# Patient Record
Sex: Female | Born: 1935 | Race: White | Hispanic: No | Smoking: Never smoker
Health system: Southern US, Community
[De-identification: ages and names within clinical notes are randomized; demographics above are authoritative.]

## PROBLEM LIST (undated history)

## (undated) DIAGNOSIS — M1990 Unspecified osteoarthritis, unspecified site: Secondary | ICD-10-CM

## (undated) DIAGNOSIS — I10 Essential (primary) hypertension: Secondary | ICD-10-CM

## (undated) DIAGNOSIS — F32A Depression, unspecified: Secondary | ICD-10-CM

## (undated) DIAGNOSIS — IMO0001 Reserved for inherently not codable concepts without codable children: Secondary | ICD-10-CM

## (undated) DIAGNOSIS — H409 Unspecified glaucoma: Secondary | ICD-10-CM

## (undated) DIAGNOSIS — Z923 Personal history of irradiation: Secondary | ICD-10-CM

## (undated) DIAGNOSIS — J302 Other seasonal allergic rhinitis: Secondary | ICD-10-CM

## (undated) DIAGNOSIS — G2581 Restless legs syndrome: Secondary | ICD-10-CM

## (undated) DIAGNOSIS — C50912 Malignant neoplasm of unspecified site of left female breast: Secondary | ICD-10-CM

## (undated) DIAGNOSIS — N390 Urinary tract infection, site not specified: Secondary | ICD-10-CM

## (undated) DIAGNOSIS — F329 Major depressive disorder, single episode, unspecified: Secondary | ICD-10-CM

## (undated) DIAGNOSIS — H9190 Unspecified hearing loss, unspecified ear: Secondary | ICD-10-CM

## (undated) DIAGNOSIS — E7800 Pure hypercholesterolemia, unspecified: Secondary | ICD-10-CM

## (undated) HISTORY — DX: Unspecified glaucoma: H40.9

## (undated) HISTORY — DX: Essential (primary) hypertension: I10

## (undated) HISTORY — PX: CATARACT EXTRACTION W/ INTRAOCULAR LENS  IMPLANT, BILATERAL: SHX1307

---

## 1968-11-18 HISTORY — PX: TUBAL LIGATION: SHX77

## 1996-11-18 HISTORY — PX: FOOT SURGERY: SHX648

## 1998-06-22 ENCOUNTER — Other Ambulatory Visit: Admission: RE | Admit: 1998-06-22 | Discharge: 1998-06-22 | Payer: Self-pay | Admitting: Gynecology

## 1998-09-18 ENCOUNTER — Encounter: Payer: Self-pay | Admitting: Gynecology

## 1998-09-18 ENCOUNTER — Ambulatory Visit (HOSPITAL_COMMUNITY): Admission: RE | Admit: 1998-09-18 | Discharge: 1998-09-18 | Payer: Self-pay | Admitting: Gynecology

## 1999-07-02 ENCOUNTER — Other Ambulatory Visit: Admission: RE | Admit: 1999-07-02 | Discharge: 1999-07-02 | Payer: Self-pay | Admitting: Gynecology

## 1999-09-25 ENCOUNTER — Ambulatory Visit (HOSPITAL_COMMUNITY): Admission: RE | Admit: 1999-09-25 | Discharge: 1999-09-25 | Payer: Self-pay | Admitting: Gynecology

## 1999-09-25 ENCOUNTER — Encounter: Payer: Self-pay | Admitting: Gynecology

## 2000-01-18 ENCOUNTER — Encounter (INDEPENDENT_AMBULATORY_CARE_PROVIDER_SITE_OTHER): Payer: Self-pay | Admitting: Specialist

## 2000-01-18 ENCOUNTER — Ambulatory Visit (HOSPITAL_COMMUNITY): Admission: RE | Admit: 2000-01-18 | Discharge: 2000-01-18 | Payer: Self-pay | Admitting: Gynecology

## 2000-07-14 ENCOUNTER — Other Ambulatory Visit: Admission: RE | Admit: 2000-07-14 | Discharge: 2000-07-14 | Payer: Self-pay | Admitting: Gynecology

## 2000-09-26 ENCOUNTER — Ambulatory Visit (HOSPITAL_COMMUNITY): Admission: RE | Admit: 2000-09-26 | Discharge: 2000-09-26 | Payer: Self-pay | Admitting: Gynecology

## 2000-09-26 ENCOUNTER — Encounter: Payer: Self-pay | Admitting: Gynecology

## 2000-11-18 HISTORY — PX: KNEE ARTHROSCOPY: SUR90

## 2001-07-15 ENCOUNTER — Other Ambulatory Visit: Admission: RE | Admit: 2001-07-15 | Discharge: 2001-07-15 | Payer: Self-pay | Admitting: Gynecology

## 2001-10-07 ENCOUNTER — Ambulatory Visit (HOSPITAL_COMMUNITY): Admission: RE | Admit: 2001-10-07 | Discharge: 2001-10-07 | Payer: Self-pay | Admitting: Gynecology

## 2001-10-07 ENCOUNTER — Encounter: Payer: Self-pay | Admitting: Gynecology

## 2001-10-13 ENCOUNTER — Encounter: Payer: Self-pay | Admitting: Gynecology

## 2001-10-13 ENCOUNTER — Encounter: Admission: RE | Admit: 2001-10-13 | Discharge: 2001-10-13 | Payer: Self-pay | Admitting: Gynecology

## 2002-07-26 ENCOUNTER — Other Ambulatory Visit: Admission: RE | Admit: 2002-07-26 | Discharge: 2002-07-26 | Payer: Self-pay | Admitting: Gynecology

## 2002-10-18 ENCOUNTER — Encounter: Payer: Self-pay | Admitting: Gynecology

## 2002-10-18 ENCOUNTER — Encounter: Admission: RE | Admit: 2002-10-18 | Discharge: 2002-10-18 | Payer: Self-pay | Admitting: Gynecology

## 2003-10-19 ENCOUNTER — Encounter: Payer: Self-pay | Admitting: Internal Medicine

## 2003-10-19 LAB — CONVERTED CEMR LAB

## 2003-10-27 ENCOUNTER — Other Ambulatory Visit: Admission: RE | Admit: 2003-10-27 | Discharge: 2003-10-27 | Payer: Self-pay | Admitting: Gynecology

## 2003-12-15 ENCOUNTER — Encounter: Admission: RE | Admit: 2003-12-15 | Discharge: 2003-12-15 | Payer: Self-pay | Admitting: Gynecology

## 2004-09-03 ENCOUNTER — Ambulatory Visit: Payer: Self-pay | Admitting: Orthopaedic Surgery

## 2004-12-07 ENCOUNTER — Ambulatory Visit: Payer: Self-pay | Admitting: Internal Medicine

## 2004-12-20 ENCOUNTER — Encounter: Admission: RE | Admit: 2004-12-20 | Discharge: 2004-12-20 | Payer: Self-pay | Admitting: Gynecology

## 2004-12-25 ENCOUNTER — Ambulatory Visit: Payer: Self-pay | Admitting: Internal Medicine

## 2005-01-21 ENCOUNTER — Ambulatory Visit: Payer: Self-pay | Admitting: Internal Medicine

## 2005-01-22 ENCOUNTER — Ambulatory Visit: Payer: Self-pay | Admitting: Family Medicine

## 2005-02-04 ENCOUNTER — Ambulatory Visit: Payer: Self-pay | Admitting: Internal Medicine

## 2005-02-04 ENCOUNTER — Ambulatory Visit (HOSPITAL_COMMUNITY): Admission: RE | Admit: 2005-02-04 | Discharge: 2005-02-04 | Payer: Self-pay | Admitting: Internal Medicine

## 2005-02-08 ENCOUNTER — Ambulatory Visit (HOSPITAL_COMMUNITY): Admission: RE | Admit: 2005-02-08 | Discharge: 2005-02-08 | Payer: Self-pay | Admitting: Internal Medicine

## 2005-02-09 ENCOUNTER — Emergency Department (HOSPITAL_COMMUNITY): Admission: EM | Admit: 2005-02-09 | Discharge: 2005-02-09 | Payer: Self-pay | Admitting: Emergency Medicine

## 2005-05-14 ENCOUNTER — Ambulatory Visit: Payer: Self-pay | Admitting: Internal Medicine

## 2005-06-19 ENCOUNTER — Ambulatory Visit (HOSPITAL_COMMUNITY): Admission: RE | Admit: 2005-06-19 | Discharge: 2005-06-19 | Payer: Self-pay | Admitting: General Surgery

## 2005-08-21 ENCOUNTER — Encounter (INDEPENDENT_AMBULATORY_CARE_PROVIDER_SITE_OTHER): Payer: Self-pay | Admitting: *Deleted

## 2005-08-21 ENCOUNTER — Ambulatory Visit (HOSPITAL_COMMUNITY): Admission: RE | Admit: 2005-08-21 | Discharge: 2005-08-22 | Payer: Self-pay | Admitting: General Surgery

## 2005-08-21 HISTORY — PX: DIAGNOSTIC LAPAROSCOPY: SUR761

## 2005-09-10 ENCOUNTER — Encounter: Admission: RE | Admit: 2005-09-10 | Discharge: 2005-09-10 | Payer: Self-pay | Admitting: General Surgery

## 2006-01-02 ENCOUNTER — Encounter: Admission: RE | Admit: 2006-01-02 | Discharge: 2006-01-02 | Payer: Self-pay | Admitting: Gynecology

## 2006-01-29 ENCOUNTER — Other Ambulatory Visit: Admission: RE | Admit: 2006-01-29 | Discharge: 2006-01-29 | Payer: Self-pay | Admitting: Gynecology

## 2006-05-07 ENCOUNTER — Encounter: Admission: RE | Admit: 2006-05-07 | Discharge: 2006-05-07 | Payer: Self-pay | Admitting: General Surgery

## 2006-08-18 ENCOUNTER — Ambulatory Visit: Payer: Self-pay | Admitting: Internal Medicine

## 2006-09-09 ENCOUNTER — Ambulatory Visit: Payer: Self-pay | Admitting: Internal Medicine

## 2006-09-09 LAB — CONVERTED CEMR LAB
ALT: 30 units/L (ref 0–40)
Albumin: 3.7 g/dL (ref 3.5–5.2)
Alkaline Phosphatase: 79 units/L (ref 39–117)
BUN: 12 mg/dL (ref 6–23)
CO2: 29 meq/L (ref 19–32)
GFR calc non Af Amer: 88 mL/min
Glucose, Bld: 88 mg/dL (ref 70–99)
Total Bilirubin: 0.6 mg/dL (ref 0.3–1.2)
Total Protein: 7 g/dL (ref 6.0–8.3)

## 2006-09-12 ENCOUNTER — Ambulatory Visit: Payer: Self-pay | Admitting: Internal Medicine

## 2006-09-23 ENCOUNTER — Ambulatory Visit: Payer: Self-pay | Admitting: Internal Medicine

## 2007-01-05 ENCOUNTER — Ambulatory Visit: Payer: Self-pay | Admitting: Internal Medicine

## 2007-01-05 LAB — CONVERTED CEMR LAB
ALT: 36 units/L (ref 0–40)
Alkaline Phosphatase: 112 units/L (ref 39–117)
BUN: 21 mg/dL (ref 6–23)
CO2: 30 meq/L (ref 19–32)
Eosinophils Absolute: 0.5 10*3/uL (ref 0.0–0.6)
GFR calc Af Amer: 38 mL/min
Leukocytes, UA: NEGATIVE
Lymphocytes Relative: 3 % — ABNORMAL LOW (ref 12.0–46.0)
MCV: 95.3 fL (ref 78.0–100.0)
Monocytes Relative: 1.5 % — ABNORMAL LOW (ref 3.0–11.0)
Neutro Abs: 9.4 10*3/uL — ABNORMAL HIGH (ref 1.4–7.7)
Platelets: 145 10*3/uL — ABNORMAL LOW (ref 150–400)
Potassium: 3.3 meq/L — ABNORMAL LOW (ref 3.5–5.1)
Specific Gravity, Urine: >=1.03 (ref 1.000–1.03)
Urine Glucose: NEGATIVE mg/dL
Urobilinogen, UA: 1 (ref 0.0–1.0)
WBC, UA: NONE SEEN cells/hpf

## 2007-01-06 ENCOUNTER — Ambulatory Visit: Payer: Self-pay | Admitting: Internal Medicine

## 2007-01-06 ENCOUNTER — Inpatient Hospital Stay (HOSPITAL_COMMUNITY): Admission: AD | Admit: 2007-01-06 | Discharge: 2007-01-10 | Payer: Self-pay | Admitting: Internal Medicine

## 2007-01-07 ENCOUNTER — Ambulatory Visit: Payer: Self-pay | Admitting: Internal Medicine

## 2007-01-13 ENCOUNTER — Ambulatory Visit: Payer: Self-pay | Admitting: Internal Medicine

## 2007-01-19 ENCOUNTER — Ambulatory Visit: Payer: Self-pay | Admitting: Internal Medicine

## 2007-02-18 ENCOUNTER — Encounter: Admission: RE | Admit: 2007-02-18 | Discharge: 2007-02-18 | Payer: Self-pay | Admitting: Gynecology

## 2007-02-24 ENCOUNTER — Encounter: Admission: RE | Admit: 2007-02-24 | Discharge: 2007-02-24 | Payer: Self-pay | Admitting: Gynecology

## 2007-02-27 ENCOUNTER — Encounter: Admission: RE | Admit: 2007-02-27 | Discharge: 2007-02-27 | Payer: Self-pay | Admitting: Gynecology

## 2007-04-08 ENCOUNTER — Encounter: Admission: RE | Admit: 2007-04-08 | Discharge: 2007-04-08 | Payer: Self-pay | Admitting: Surgery

## 2007-04-10 ENCOUNTER — Encounter (INDEPENDENT_AMBULATORY_CARE_PROVIDER_SITE_OTHER): Payer: Self-pay | Admitting: Surgery

## 2007-04-10 ENCOUNTER — Ambulatory Visit (HOSPITAL_BASED_OUTPATIENT_CLINIC_OR_DEPARTMENT_OTHER): Admission: RE | Admit: 2007-04-10 | Discharge: 2007-04-10 | Payer: Self-pay | Admitting: Surgery

## 2007-04-10 ENCOUNTER — Encounter: Admission: RE | Admit: 2007-04-10 | Discharge: 2007-04-10 | Payer: Self-pay | Admitting: Surgery

## 2007-04-10 HISTORY — PX: BREAST SURGERY: SHX581

## 2007-06-13 ENCOUNTER — Encounter: Payer: Self-pay | Admitting: Internal Medicine

## 2007-06-13 DIAGNOSIS — M899 Disorder of bone, unspecified: Secondary | ICD-10-CM | POA: Insufficient documentation

## 2007-06-13 DIAGNOSIS — F3289 Other specified depressive episodes: Secondary | ICD-10-CM | POA: Insufficient documentation

## 2007-06-13 DIAGNOSIS — K219 Gastro-esophageal reflux disease without esophagitis: Secondary | ICD-10-CM

## 2007-06-13 DIAGNOSIS — F329 Major depressive disorder, single episode, unspecified: Secondary | ICD-10-CM

## 2007-06-13 DIAGNOSIS — M949 Disorder of cartilage, unspecified: Secondary | ICD-10-CM

## 2007-06-13 DIAGNOSIS — Z86718 Personal history of other venous thrombosis and embolism: Secondary | ICD-10-CM

## 2007-09-21 ENCOUNTER — Encounter: Payer: Self-pay | Admitting: Internal Medicine

## 2007-10-24 ENCOUNTER — Encounter (INDEPENDENT_AMBULATORY_CARE_PROVIDER_SITE_OTHER): Payer: Self-pay | Admitting: *Deleted

## 2007-10-30 ENCOUNTER — Ambulatory Visit: Payer: Self-pay | Admitting: Internal Medicine

## 2007-10-30 DIAGNOSIS — I10 Essential (primary) hypertension: Secondary | ICD-10-CM

## 2007-10-30 DIAGNOSIS — H0019 Chalazion unspecified eye, unspecified eyelid: Secondary | ICD-10-CM

## 2007-10-30 DIAGNOSIS — R209 Unspecified disturbances of skin sensation: Secondary | ICD-10-CM | POA: Insufficient documentation

## 2007-10-30 HISTORY — DX: Essential (primary) hypertension: I10

## 2007-11-09 LAB — CONVERTED CEMR LAB
BUN: 15 mg/dL (ref 6–23)
Calcium: 9.4 mg/dL (ref 8.4–10.5)
Chloride: 102 meq/L (ref 96–112)
GFR calc non Af Amer: 88 mL/min
Glucose, Bld: 93 mg/dL (ref 70–99)

## 2007-11-27 ENCOUNTER — Encounter: Admission: RE | Admit: 2007-11-27 | Discharge: 2007-11-27 | Payer: Self-pay | Admitting: General Surgery

## 2008-02-09 ENCOUNTER — Ambulatory Visit: Payer: Self-pay | Admitting: Internal Medicine

## 2008-02-23 ENCOUNTER — Encounter: Admission: RE | Admit: 2008-02-23 | Discharge: 2008-02-23 | Payer: Self-pay | Admitting: Gynecology

## 2008-03-04 ENCOUNTER — Encounter: Payer: Self-pay | Admitting: Internal Medicine

## 2008-05-25 ENCOUNTER — Telehealth: Payer: Self-pay | Admitting: Internal Medicine

## 2008-06-08 ENCOUNTER — Ambulatory Visit: Payer: Self-pay | Admitting: Internal Medicine

## 2008-06-08 LAB — CONVERTED CEMR LAB
ALT: 34 units/L (ref 0–35)
AST: 32 units/L (ref 0–37)
Basophils Absolute: 0 10*3/uL (ref 0.0–0.1)
Eosinophils Absolute: 0.2 10*3/uL (ref 0.0–0.7)
HDL: 37.5 mg/dL — ABNORMAL LOW (ref >39.0)
MCHC: 33.8 g/dL (ref 30.0–36.0)
MCV: 95 fL (ref 78.0–100.0)
Monocytes Absolute: 0.4 10*3/uL (ref 0.1–1.0)
Neutrophils Relative %: 59.5 % (ref 43.0–77.0)
Platelets: 236 10*3/uL (ref 150–400)
RDW: 13.4 % (ref 11.5–14.6)
TSH: 3.09 microintl units/mL (ref 0.35–5.50)
Triglycerides: 66 mg/dL (ref 0–149)
VLDL: 13 mg/dL (ref 0–40)
WBC: 6.4 10*3/uL (ref 4.5–10.5)

## 2008-06-22 ENCOUNTER — Ambulatory Visit: Payer: Self-pay | Admitting: Internal Medicine

## 2008-06-22 DIAGNOSIS — K5730 Diverticulosis of large intestine without perforation or abscess without bleeding: Secondary | ICD-10-CM | POA: Insufficient documentation

## 2008-06-22 DIAGNOSIS — E785 Hyperlipidemia, unspecified: Secondary | ICD-10-CM | POA: Insufficient documentation

## 2008-06-27 ENCOUNTER — Encounter: Payer: Self-pay | Admitting: Internal Medicine

## 2008-07-29 ENCOUNTER — Encounter: Payer: Self-pay | Admitting: Internal Medicine

## 2008-11-23 ENCOUNTER — Encounter: Payer: Self-pay | Admitting: Internal Medicine

## 2008-12-12 ENCOUNTER — Telehealth: Payer: Self-pay | Admitting: Internal Medicine

## 2008-12-13 ENCOUNTER — Telehealth: Payer: Self-pay | Admitting: Internal Medicine

## 2008-12-14 ENCOUNTER — Encounter: Payer: Self-pay | Admitting: Internal Medicine

## 2008-12-27 ENCOUNTER — Encounter: Payer: Self-pay | Admitting: Internal Medicine

## 2008-12-28 ENCOUNTER — Ambulatory Visit: Payer: Self-pay | Admitting: Internal Medicine

## 2008-12-28 DIAGNOSIS — M5430 Sciatica, unspecified side: Secondary | ICD-10-CM

## 2009-01-05 ENCOUNTER — Encounter: Payer: Self-pay | Admitting: Internal Medicine

## 2009-01-16 ENCOUNTER — Encounter: Payer: Self-pay | Admitting: Internal Medicine

## 2009-01-25 ENCOUNTER — Encounter: Payer: Self-pay | Admitting: Internal Medicine

## 2009-03-01 ENCOUNTER — Encounter: Admission: RE | Admit: 2009-03-01 | Discharge: 2009-03-01 | Payer: Self-pay | Admitting: Gynecology

## 2009-06-21 ENCOUNTER — Ambulatory Visit: Payer: Self-pay | Admitting: Internal Medicine

## 2009-06-21 DIAGNOSIS — R5381 Other malaise: Secondary | ICD-10-CM | POA: Insufficient documentation

## 2009-06-21 DIAGNOSIS — M5137 Other intervertebral disc degeneration, lumbosacral region: Secondary | ICD-10-CM | POA: Insufficient documentation

## 2009-06-21 DIAGNOSIS — R5383 Other fatigue: Secondary | ICD-10-CM

## 2009-06-21 LAB — CONVERTED CEMR LAB
ALT: 17 units/L (ref 0–35)
Alkaline Phosphatase: 67 units/L (ref 39–117)
BUN: 11 mg/dL (ref 6–23)
Basophils Relative: 1 % (ref 0.0–3.0)
Bilirubin, Direct: 0.1 mg/dL (ref 0.0–0.3)
Calcium: 8.9 mg/dL (ref 8.4–10.5)
Cholesterol: 170 mg/dL (ref 0–200)
Creatinine, Ser: 0.7 mg/dL (ref 0.4–1.2)
Eosinophils Absolute: 0.2 10*3/uL (ref 0.0–0.7)
Eosinophils Relative: 3 % (ref 0.0–5.0)
LDL Cholesterol: 113 mg/dL — ABNORMAL HIGH (ref 0–99)
Lymphocytes Relative: 26.7 % (ref 12.0–46.0)
Neutrophils Relative %: 62.8 % (ref 43.0–77.0)
Platelets: 218 10*3/uL (ref 150.0–400.0)
RBC: 4.1 M/uL (ref 3.87–5.11)
Total Bilirubin: 0.8 mg/dL (ref 0.3–1.2)
Total CHOL/HDL Ratio: 4
Total Protein: 7.5 g/dL (ref 6.0–8.3)
Triglycerides: 67 mg/dL (ref 0.0–149.0)
VLDL: 13.4 mg/dL (ref 0.0–40.0)
WBC: 6.6 10*3/uL (ref 4.5–10.5)

## 2009-09-29 ENCOUNTER — Telehealth: Payer: Self-pay | Admitting: Internal Medicine

## 2009-11-18 HISTORY — PX: CERVICAL FUSION: SHX112

## 2010-01-04 ENCOUNTER — Ambulatory Visit: Payer: Self-pay | Admitting: Internal Medicine

## 2010-01-04 DIAGNOSIS — R079 Chest pain, unspecified: Secondary | ICD-10-CM

## 2010-03-27 ENCOUNTER — Encounter: Admission: RE | Admit: 2010-03-27 | Discharge: 2010-03-27 | Payer: Self-pay | Admitting: Gynecology

## 2010-07-05 ENCOUNTER — Ambulatory Visit: Payer: Self-pay | Admitting: Internal Medicine

## 2010-08-13 ENCOUNTER — Telehealth: Payer: Self-pay | Admitting: Internal Medicine

## 2010-10-09 ENCOUNTER — Encounter: Payer: Self-pay | Admitting: Internal Medicine

## 2010-12-09 ENCOUNTER — Encounter: Payer: Self-pay | Admitting: Gynecology

## 2010-12-12 ENCOUNTER — Ambulatory Visit: Payer: Self-pay | Admitting: Ophthalmology

## 2010-12-18 NOTE — Letter (Signed)
Summary: Nerve Root Block / [facility]  Nerve Root Block / [facility]   Imported By: Lennie Odor 10/12/2010 12:00:55  _____________________________________________________________________  External Attachment:    Type:   Image     Comment:   External Document

## 2010-12-18 NOTE — Progress Notes (Signed)
°  Phone Note Refill Request Message from:  Fax from Pharmacy on December 12, 2008 4:08 PM  Refills Requested: Medication #1:  GABAPENTIN 300 MG  TABS 2 tablets by mouth at bedtime  Method Requested: Fax to Mail Away Pharmacy Initial call taken by: Payton Spark CMA,  December 12, 2008 4:08 PM      Appended Document:  LM w/ female at home # for Pt to CB.  Pt needs OV for further Rx refills.

## 2010-12-18 NOTE — Assessment & Plan Note (Signed)
Summary: 6 mos f/u #/cd   Vital Signs:  Patient profile:   75 year old female Height:      65 inches Weight:      183.50 pounds BMI:     30.65 O2 Sat:      95 % on Room air Temp:     98.1 degrees F oral Pulse rate:   68 / minute BP sitting:   110 / 70  (left arm) Cuff size:   regular  Vitals Entered By: Zella Ball Ewing CMA Duncan Dull) (July 05, 2010 10:27 AM)  O2 Flow:  Room air CC: 6 month ROV/RE   CC:  6 month ROV/RE.  History of Present Illness: here to f/u;  trying to follow lwoer hcol diet; has chronic recurrent pain from herniated disc l2 s/p ESI x 2 (Dr Ethelene Hal) - pain overall getting beter but still hurts to walk, without incr LE pain/weak or numbness.  Pt denies CP, sob, doe, wheezing, orthopnea, pnd, worsening LE edema, palps, dizziness or syncope  Pt denies new neuro symptoms such as headache, facial or extremity weakness   Husband on dialysis, pt has ongoing depression , no worsening suicidal ideation or panic.  Spends most of her time caring for him , alhtough he can drive and do some help around the house.    Preventive Screening-Counseling & Management      Drug Use:  no.    Problems Prior to Update: 1)  Preventive Health Care  (ICD-V70.0) 2)  Chest Pain  (ICD-786.50) 3)  Disc Disease, Lumbar  (ICD-722.52) 4)  Fatigue  (ICD-780.79) 5)  Sciatica, Left  (ICD-724.3) 6)  Diverticulosis, Colon  (ICD-562.10) 7)  Hyperlipidemia  (ICD-272.4) 8)  Chalazion  (ICD-373.2) 9)  Paresthesia, Hands  (ICD-782.0) 10)  Hypertension  (ICD-401.9) 11)  Osteopenia  (ICD-733.90) 12)  Dvt, Hx of  (ICD-V12.51) 13)  Gerd  (ICD-530.81) 14)  Depression  (ICD-311)  Medications Prior to Update: 1)  Gabapentin 300 Mg Caps (Gabapentin) .... 2 By Mouth Three Times A Day 2)  Hydrochlorothiazide 25 Mg  Tabs (Hydrochlorothiazide) .... Take 1/2 Tab By Mouth Every Morning 3)  Fexofenadine Hcl 180 Mg  Tabs (Fexofenadine Hcl) .Marland Kitchen.. 1 By Mouth Once Daily 4)  Proctosol Hc 2.5 %  Crea (Hydrocortisone)  .... Apply To Affected Area Three Times A Day As Needed 5)  Hemorrhoidal-Hc 25 Mg  Supp (Hydrocortisone Acetate) .... Insert One Suppository Rectally Two Times A Day As Needed 6)  Acidophilus   Tabs (Lactobacillus) .... Take 1 Tablet By Mouth Once A Day 7)  Fish Oil 1000 Mg  Caps (Omega-3 Fatty Acids) .... Take 1 Tablet By Mouth Once A Day 8)  Sertraline Hcl 100 Mg  Tabs (Sertraline Hcl) .... 2 By Mouth Once Daily 9)  Clobetasol Propionate E 0.05 %  Crea (Clobetasol Prop Emollient Base) 10)  Fluconazole 100 Mg  Tabs (Fluconazole) 11)  Anucort-Hc 25 Mg  Supp (Hydrocortisone Acetate) .Marland Kitchen.. 1 Pr Two Times A Day As Needed 12)  Estrace 0.1 Mg/gm  Crea (Estradiol) 13)  Xalatan 0.005 %  Soln (Latanoprost) .Marland Kitchen.. 1 Gtt Each Eye Qhs 14)  Nitrofurantoin Macrocrystal 100 Mg Caps (Nitrofurantoin Macrocrystal) .Marland Kitchen.. 1 By Mouth At Bedtime 15)  Adult Aspirin Ec Low Strength 81 Mg Tbec (Aspirin) .Marland Kitchen.. 1 By Mouth Once Daily 16)  Alprazolam 0.25 Mg Tabs (Alprazolam) .Marland Kitchen.. 1po Two Times A Day As Needed  Current Medications (verified): 1)  Gabapentin 300 Mg Caps (Gabapentin) .... 2 By Mouth Three Times A Day 2)  Hydrochlorothiazide 25 Mg  Tabs (Hydrochlorothiazide) .... Take 1/2 Tab By Mouth Every Morning 3)  Fexofenadine Hcl 180 Mg  Tabs (Fexofenadine Hcl) .Marland Kitchen.. 1 By Mouth Once Daily As Needed Allergies 4)  Acidophilus   Tabs (Lactobacillus) .... Take 1 Tablet By Mouth Once A Day 5)  Fish Oil 1000 Mg  Caps (Omega-3 Fatty Acids) .... Take 1 Tablet By Mouth Once A Day 6)  Sertraline Hcl 100 Mg  Tabs (Sertraline Hcl) .... 2 By Mouth Once Daily 7)  Clobetasol Propionate E 0.05 %  Crea (Clobetasol Prop Emollient Base) 8)  Fluconazole 100 Mg  Tabs (Fluconazole) .Marland Kitchen.. 1 By Mouth Once Daily For 3 Days As Needed 9)  Anucort-Hc 25 Mg  Supp (Hydrocortisone Acetate) .Marland Kitchen.. 1 Pr Two Times A Day As Needed 10)  Estrace 0.1 Mg/gm  Crea (Estradiol) 11)  Xalatan 0.005 %  Soln (Latanoprost) .Marland Kitchen.. 1 Gtt Each Eye Qhs 12)  Nitrofurantoin  Macrocrystal 100 Mg Caps (Nitrofurantoin Macrocrystal) .Marland Kitchen.. 1 By Mouth At Bedtime 13)  Adult Aspirin Ec Low Strength 81 Mg Tbec (Aspirin) .Marland Kitchen.. 1 By Mouth Once Daily 14)  Alprazolam 0.25 Mg Tabs (Alprazolam) .Marland Kitchen.. 1po Two Times A Day As Needed  Allergies (verified): 1)  Codeine 2)  * Avelox 3)  Penicillin 4)  * Mirapex 5)  * Requip  Past History:  Past Medical History: Last updated: 06/22/2008 Depression GERD Glaucoma Right Hepatic Cyst Restless Leg Syndrome DVT, hx of Osteopenia Left breast lump status post biopsy April of 2008 ( negative for malignancy) Hyperlipidemia Diverticulosis, colon hx of pna - hosp'd 2/08  Past Surgical History: Last updated: 10/30/2007 Tubal ligation Knee, foot surgery Lumpectomy April 2008.  Social History: Last updated: 07/05/2010 Retired - Data processing manager Married Never Smoked Alcohol use-no she has two children Drug use-no  Risk Factors: Smoking Status: never (10/30/2007)  Social History: Reviewed history from 06/22/2008 and no changes required. Retired Theatre manager Married Never Smoked Alcohol use-no she has two children Drug use-no Drug Use:  no  Review of Systems       all otherwise negative per pt -    Physical Exam  General:  alert and overweight-appearing.   Head:  normocephalic and atraumatic.   Eyes:  vision grossly intact, pupils equal, and pupils round.   Ears:  R ear normal and L ear normal.   Nose:  no external deformity and no nasal discharge.   Mouth:  no gingival abnormalities and pharynx pink and moist.   Neck:  supple and no masses.   Lungs:  normal respiratory effort and normal breath sounds.   Heart:  normal rate and regular rhythm.   Abdomen:  soft, non-tender, and normal bowel sounds.   Msk:  no spine tender or bilat paravertebral tender Extremities:  no edema, no erythema  Neurologic:  cranial nerves II-XII intact and strength normal in all extremities.  except for prox and distal LLE  3+/5  Psych:  depressed affect and slightly anxious.     Impression & Recommendations:  Problem # 1:  DISC DISEASE, LUMBAR (ICD-722.52) with worsening LLE weakness, for ortho f/u - likely needs surgury but she has been deferring due to husband's illness.Continue all previous medications as before this visit   Problem # 2:  DEPRESSION (ICD-311)  Her updated medication list for this problem includes:    Sertraline Hcl 100 Mg Tabs (Sertraline hcl) .Marland Kitchen... 2 by mouth once daily    Alprazolam 0.25 Mg Tabs (Alprazolam) .Marland Kitchen... 1po two times a day as  needed stable overall by hx and exam, ok to continue meds/tx as is , declines counseling needed at this time  Problem # 3:  HYPERTENSION (ICD-401.9)  Her updated medication list for this problem includes:    Hydrochlorothiazide 25 Mg Tabs (Hydrochlorothiazide) .Marland Kitchen... Take 1/2 tab by mouth every morning  BP today: 110/70 Prior BP: 132/80 (01/04/2010)  Labs Reviewed: K+: 3.7 (06/21/2009) Creat: : 0.7 (06/21/2009)   Chol: 170 (06/21/2009)   HDL: 43.20 (06/21/2009)   LDL: 113 (06/21/2009)   TG: 67.0 (06/21/2009) stable overall by hx and exam, ok to continue meds/tx as is   Complete Medication List: 1)  Gabapentin 300 Mg Caps (Gabapentin) .... 2 by mouth three times a day 2)  Hydrochlorothiazide 25 Mg Tabs (Hydrochlorothiazide) .... Take 1/2 tab by mouth every morning 3)  Fexofenadine Hcl 180 Mg Tabs (Fexofenadine hcl) .Marland Kitchen.. 1 by mouth once daily as needed allergies 4)  Acidophilus Tabs (Lactobacillus) .... Take 1 tablet by mouth once a day 5)  Fish Oil 1000 Mg Caps (Omega-3 fatty acids) .... Take 1 tablet by mouth once a day 6)  Sertraline Hcl 100 Mg Tabs (Sertraline hcl) .... 2 by mouth once daily 7)  Clobetasol Propionate E 0.05 % Crea (Clobetasol prop emollient base) 8)  Fluconazole 100 Mg Tabs (Fluconazole) .Marland Kitchen.. 1 by mouth once daily for 3 days as needed 9)  Anucort-hc 25 Mg Supp (Hydrocortisone acetate) .Marland Kitchen.. 1 pr two times a day as needed 10)   Estrace 0.1 Mg/gm Crea (Estradiol) 11)  Xalatan 0.005 % Soln (Latanoprost) .Marland Kitchen.. 1 gtt each eye qhs 12)  Nitrofurantoin Macrocrystal 100 Mg Caps (Nitrofurantoin macrocrystal) .Marland Kitchen.. 1 by mouth at bedtime 13)  Adult Aspirin Ec Low Strength 81 Mg Tbec (Aspirin) .Marland Kitchen.. 1 by mouth once daily 14)  Alprazolam 0.25 Mg Tabs (Alprazolam) .Marland Kitchen.. 1po two times a day as needed  Patient Instructions: 1)  Continue all previous medications as before this visit  2)  Please schedule a follow-up appointment in 6 months for your "yearly medicare exam" Prescriptions: ALPRAZOLAM 0.25 MG TABS (ALPRAZOLAM) 1po two times a day as needed  #60 x 2   Entered and Authorized by:   Corwin Levins MD   Signed by:   Corwin Levins MD on 07/05/2010   Method used:   Print then Give to Patient   RxID:   [id number] FLUCONAZOLE 100 MG  TABS (FLUCONAZOLE) 1 by mouth once daily for 3 days as needed  #3 x 1   Entered and Authorized by:   Corwin Levins MD   Signed by:   Corwin Levins MD on 07/05/2010   Method used:   Print then Give to Patient   RxID:   [id number] SERTRALINE HCL 100 MG  TABS (SERTRALINE HCL) 2 by mouth once daily  #180 x 3   Entered and Authorized by:   Corwin Levins MD   Signed by:   Corwin Levins MD on 07/05/2010   Method used:   Print then Give to Patient   RxID:   [id number] FEXOFENADINE HCL 180 MG  TABS (FEXOFENADINE HCL) 1 by mouth once daily as needed allergies  #90 x 3   Entered and Authorized by:   Corwin Levins MD   Signed by:   Corwin Levins MD on 07/05/2010   Method used:   Print then Give to Patient   RxID:   [id number] HYDROCHLOROTHIAZIDE 25 MG  TABS (HYDROCHLOROTHIAZIDE) Take 1/2 tab by mouth every morning  #100  x 3   Entered and Authorized by:   Corwin Levins MD   Signed by:   Corwin Levins MD on 07/05/2010   Method used:   Print then Give to Patient   RxID:   [id number] GABAPENTIN 300 MG CAPS (GABAPENTIN) 2 by mouth three times a day  #180 x 5   Entered and Authorized by:    Corwin Levins MD   Signed by:   Corwin Levins MD on 07/05/2010   Method used:   Print then Give to Patient   RxID:   [id number]

## 2010-12-18 NOTE — Assessment & Plan Note (Signed)
Summary: 6 MO ROV /NWS  #   Vital Signs:  Patient profile:   75 year old female Height:      65 inches Weight:      191 pounds BMI:     31.90 O2 Sat:      99 % on Room air Temp:     97.2 degrees F oral Pulse rate:   65 / minute BP sitting:   132 / 80  (left arm) Cuff size:   regular  Vitals Entered ByZella Ball Ewing (January 04, 2010 10:26 AM)  O2 Flow:  Room air  Preventive Care Screening  Last Flu Shot:    Date:  08/18/2009    Results:  Historical   Mammogram:    Date:  02/16/2009    Results:  normal   CC: 6 MO ROV/RE   CC:  6 MO ROV/RE.  History of Present Illness: here with much stress over husband's illness now on dialysis with him "not adjusting well", and she has marked stress and worsening depressive symtpoms without suicidal ideation;  ran out of her sertraline a few days ago adn reqeusts refill, did not sleep last night  - "none at all";denies worsening depressive symptoms or suicidal ideation or panic;  Pt denies , sob, doe, wheezing, orthopnea, pnd, worsening LE edema, palps, dizziness or syncope   Pt denies new neuro symptoms such as headache, facial or extremity weakness   .  does have occasional mild sharp fleeting chest pains diffusely without assoc symptoms , diaphoresis, n/v and not exertional or improved with rest.  No aggrevating factors. Here for wellness Diet: Heart Healthy or DM if diabetic Physical Activities: Sedentary Depression/mood screen: Negative except for above Hearing: Intact bilateral Visual Acuity: Grossly normal ADL's: Capable  Fall Risk: None Home Safety: Good End-of-Life Planning: Advance directive - Full code/I agree   Problems Prior to Update: 1)  Chest Pain  (ICD-786.50) 2)  Disc Disease, Lumbar  (ICD-722.52) 3)  Fatigue  (ICD-780.79) 4)  Sciatica, Left  (ICD-724.3) 5)  Diverticulosis, Colon  (ICD-562.10) 6)  Hyperlipidemia  (ICD-272.4) 7)  Chalazion  (ICD-373.2) 8)  Paresthesia, Hands  (ICD-782.0) 9)  Hypertension   (ICD-401.9) 10)  Osteopenia  (ICD-733.90) 11)  Dvt, Hx of  (ICD-V12.51) 12)  Gerd  (ICD-530.81) 13)  Depression  (ICD-311)  Medications Prior to Update: 1)  Gabapentin 300 Mg Caps (Gabapentin) .... 2 By Mouth Three Times A Day 2)  Hydrochlorothiazide 25 Mg  Tabs (Hydrochlorothiazide) .... Take 1/2 Tab By Mouth Every Morning 3)  Fexofenadine Hcl 180 Mg  Tabs (Fexofenadine Hcl) .Marland Kitchen.. 1 By Mouth Once Daily 4)  Proctosol Hc 2.5 %  Crea (Hydrocortisone) .... Apply To Affected Area Three Times A Day As Needed 5)  Hemorrhoidal-Hc 25 Mg  Supp (Hydrocortisone Acetate) .... Insert One Suppository Rectally Two Times A Day As Needed 6)  Acidophilus   Tabs (Lactobacillus) .... Take 1 Tablet By Mouth Once A Day 7)  Fish Oil 1000 Mg  Caps (Omega-3 Fatty Acids) .... Take 1 Tablet By Mouth Once A Day 8)  Sertraline Hcl 100 Mg  Tabs (Sertraline Hcl) .... One By Mouth Once Daily 9)  Clobetasol Propionate E 0.05 %  Crea (Clobetasol Prop Emollient Base) 10)  Fluconazole 100 Mg  Tabs (Fluconazole) 11)  Anucort-Hc 25 Mg  Supp (Hydrocortisone Acetate) .Marland Kitchen.. 1 Pr Two Times A Day Prr 12)  Estrace 0.1 Mg/gm  Crea (Estradiol) 13)  Xalatan 0.005 %  Soln (Latanoprost) .Marland Kitchen.. 1 Gtt Each Eye Qhs  14)  Nitrofurantoin Macrocrystal 100 Mg Caps (Nitrofurantoin Macrocrystal) .Marland Kitchen.. 1 By Mouth At Bedtime 15)  Adult Aspirin Ec Low Strength 81 Mg Tbec (Aspirin) .Marland Kitchen.. 1 By Mouth Once Daily  Current Medications (verified): 1)  Gabapentin 300 Mg Caps (Gabapentin) .... 2 By Mouth Three Times A Day 2)  Hydrochlorothiazide 25 Mg  Tabs (Hydrochlorothiazide) .... Take 1/2 Tab By Mouth Every Morning 3)  Fexofenadine Hcl 180 Mg  Tabs (Fexofenadine Hcl) .Marland Kitchen.. 1 By Mouth Once Daily 4)  Proctosol Hc 2.5 %  Crea (Hydrocortisone) .... Apply To Affected Area Three Times A Day As Needed 5)  Hemorrhoidal-Hc 25 Mg  Supp (Hydrocortisone Acetate) .... Insert One Suppository Rectally Two Times A Day As Needed 6)  Acidophilus   Tabs (Lactobacillus) .... Take  1 Tablet By Mouth Once A Day 7)  Fish Oil 1000 Mg  Caps (Omega-3 Fatty Acids) .... Take 1 Tablet By Mouth Once A Day 8)  Sertraline Hcl 100 Mg  Tabs (Sertraline Hcl) .... 2 By Mouth Once Daily 9)  Clobetasol Propionate E 0.05 %  Crea (Clobetasol Prop Emollient Base) 10)  Fluconazole 100 Mg  Tabs (Fluconazole) 11)  Anucort-Hc 25 Mg  Supp (Hydrocortisone Acetate) .Marland Kitchen.. 1 Pr Two Times A Day As Needed 12)  Estrace 0.1 Mg/gm  Crea (Estradiol) 13)  Xalatan 0.005 %  Soln (Latanoprost) .Marland Kitchen.. 1 Gtt Each Eye Qhs 14)  Nitrofurantoin Macrocrystal 100 Mg Caps (Nitrofurantoin Macrocrystal) .Marland Kitchen.. 1 By Mouth At Bedtime 15)  Adult Aspirin Ec Low Strength 81 Mg Tbec (Aspirin) .Marland Kitchen.. 1 By Mouth Once Daily 16)  Alprazolam 0.25 Mg Tabs (Alprazolam) .Marland Kitchen.. 1po Two Times A Day As Needed  Allergies (verified): 1)  Codeine 2)  * Avelox 3)  Penicillin 4)  * Mirapex 5)  * Requip  Past History:  Past Medical History: Last updated: 06/22/2008 Depression GERD Glaucoma Right Hepatic Cyst Restless Leg Syndrome DVT, hx of Osteopenia Left breast lump status post biopsy April of 2008 ( negative for malignancy) Hyperlipidemia Diverticulosis, colon hx of pna - hosp'd 2/08  Past Surgical History: Last updated: 10/30/2007 Tubal ligation Knee, foot surgery Lumpectomy April 2008.  Family History: Last updated: 06/21/2009 mother has hypertension, Alzheimer's, and Parkinson's disease. Father has prostate cancer sister with alzehimer's  Social History: Last updated: 06/22/2008 Retired - Data processing manager Married Never Smoked Alcohol use-no she has two children  Risk Factors: Smoking Status: never (10/30/2007)  Review of Systems  The patient denies anorexia, fever, weight loss, weight gain, vision loss, decreased hearing, hoarseness, chest pain, syncope, dyspnea on exertion, peripheral edema, prolonged cough, headaches, hemoptysis, abdominal pain, melena, hematochezia, severe indigestion/heartburn,  hematuria, incontinence, muscle weakness, suspicious skin lesions, transient blindness, difficulty walking, depression, unusual weight change, abnormal bleeding, enlarged lymph nodes, and angioedema.         all otherwise negative per pt - except for mild ongoing fatigue, without osa symtpoms  Physical Exam  General:  alert and overweight-appearing.   Head:  normocephalic and atraumatic.   Eyes:  vision grossly intact, pupils equal, and pupils round.   Ears:  R ear normal and L ear normal.   Nose:  no external deformity and no nasal discharge.   Mouth:  no gingival abnormalities and pharynx pink and moist.   Neck:  supple and no masses.   Lungs:  normal respiratory effort and normal breath sounds.   Heart:  normal rate and regular rhythm.   Abdomen:  soft, non-tender, and normal bowel sounds.   Msk:  no joint tenderness  and no joint swelling.   Extremities:  no edema, no erythema  Neurologic:  cranial nerves II-XII intact and strength normal in all extremities.   Skin:  color normal and no rashes.   Psych:  not depressed appearing and moderately anxious.     Impression & Recommendations:  Problem # 1:  Preventive Health Care (ICD-V70.0)  Overall doing well, age appropriate education and counseling updated and referral for appropriate preventive services done unless declined, immunizations up to date or declined, diet counseling done if overweight, urged to quit smoking if smokes , most recent labs reviewed and current ordered if appropriate, ecg reviewed or declined (interpretation per ECG scanned in the EMR if done); information regarding Medicare Prevention requirements given if appropriate   Orders: First annual wellness visit with prevention plan  (Z6109)  Problem # 2:  CHEST PAIN (ICD-786.50) atypical , doubt cardiac, check ecg Orders: EKG w/ Interpretation (93000)  Problem # 3:  HYPERLIPIDEMIA (ICD-272.4)  Labs Reviewed: SGOT: 18 (06/21/2009)   SGPT: 17 (06/21/2009)    HDL:43.20 (06/21/2009), 37.5 (06/08/2008)  LDL:113 (06/21/2009), 134 (06/08/2008)  Chol:170 (06/21/2009), 185 (06/08/2008)  Trig:67.0 (06/21/2009), 66 (06/08/2008) stable overall by hx and exam, ok to continue meds/tx as is  - Pt to continue diet efforts,  Problem # 4:  HYPERTENSION (ICD-401.9)  Her updated medication list for this problem includes:    Hydrochlorothiazide 25 Mg Tabs (Hydrochlorothiazide) .Marland Kitchen... Take 1/2 tab by mouth every morning  BP today: 132/80 Prior BP: 118/80 (06/21/2009)  Labs Reviewed: K+: 3.7 (06/21/2009) Creat: : 0.7 (06/21/2009)   Chol: 170 (06/21/2009)   HDL: 43.20 (06/21/2009)   LDL: 113 (06/21/2009)   TG: 67.0 (06/21/2009) stable overall by hx and exam, ok to continue meds/tx as is   Orders: Prescription Created Electronically [phone])  Problem # 5:  DEPRESSION (ICD-311)  Her updated medication list for this problem includes:    Sertraline Hcl 100 Mg Tabs (Sertraline hcl) .Marland Kitchen... 2 by mouth once daily    Alprazolam 0.25 Mg Tabs (Alprazolam) .Marland Kitchen... 1po two times a day as needed treat as above, f/u any worsening signs or symptoms ; sertraline increased to 2 per day  Problem # 6:  FATIGUE (ICD-780.79) most likely due to above, declines further labs today or other evaluation  Complete Medication List: 1)  Gabapentin 300 Mg Caps (Gabapentin) .... 2 by mouth three times a day 2)  Hydrochlorothiazide 25 Mg Tabs (Hydrochlorothiazide) .... Take 1/2 tab by mouth every morning 3)  Fexofenadine Hcl 180 Mg Tabs (Fexofenadine hcl) .Marland Kitchen.. 1 by mouth once daily 4)  Proctosol Hc 2.5 % Crea (Hydrocortisone) .... Apply to affected area three times a day as needed 5)  Hemorrhoidal-hc 25 Mg Supp (Hydrocortisone acetate) .... Insert one suppository rectally two times a day as needed 6)  Acidophilus Tabs (Lactobacillus) .... Take 1 tablet by mouth once a day 7)  Fish Oil 1000 Mg Caps (Omega-3 fatty acids) .... Take 1 tablet by mouth once a day 8)  Sertraline Hcl 100 Mg Tabs  (Sertraline hcl) .... 2 by mouth once daily 9)  Clobetasol Propionate E 0.05 % Crea (Clobetasol prop emollient base) 10)  Fluconazole 100 Mg Tabs (Fluconazole) 11)  Anucort-hc 25 Mg Supp (Hydrocortisone acetate) .Marland Kitchen.. 1 pr two times a day as needed 12)  Estrace 0.1 Mg/gm Crea (Estradiol) 13)  Xalatan 0.005 % Soln (Latanoprost) .Marland Kitchen.. 1 gtt each eye qhs 14)  Nitrofurantoin Macrocrystal 100 Mg Caps (Nitrofurantoin macrocrystal) .Marland Kitchen.. 1 by mouth at bedtime 15)  Adult Aspirin  Ec Low Strength 81 Mg Tbec (Aspirin) .Marland Kitchen.. 1 by mouth once daily 16)  Alprazolam 0.25 Mg Tabs (Alprazolam) .Marland Kitchen.. 1po two times a day as needed  Patient Instructions: 1)  Your EKG was good today 2)  increase the sertraline to 2 pills per day 3)  Please take all new medications as prescribed - the alprazolam as needed 4)  Continue all previous medications as before this visit  5)  Please schedule a follow-up appointment in 6 months or sooner if needed Prescriptions: ANUCORT-HC 25 MG  SUPP (HYDROCORTISONE ACETATE) 1 pr two times a day as needed  #20 x 2   Entered and Authorized by:   Corwin Levins MD   Signed by:   Corwin Levins MD on 01/04/2010   Method used:   Print then Give to Patient   RxID:   [id number] PROCTOSOL HC 2.5 %  CREA (HYDROCORTISONE) apply to affected area three times a day as needed  #1 x 2   Entered and Authorized by:   Corwin Levins MD   Signed by:   Corwin Levins MD on 01/04/2010   Method used:   Print then Give to Patient   RxID:   [id number] SERTRALINE HCL 100 MG  TABS (SERTRALINE HCL) 2 by mouth once daily  #180 x 3   Entered and Authorized by:   Corwin Levins MD   Signed by:   Corwin Levins MD on 01/04/2010   Method used:   Print then Give to Patient   RxID:   [id number] HYDROCHLOROTHIAZIDE 25 MG  TABS (HYDROCHLOROTHIAZIDE) Take 1/2 tab by mouth every morning  #100 x 3   Entered and Authorized by:   Corwin Levins MD   Signed by:   Corwin Levins MD on 01/04/2010   Method used:   Print then  Give to Patient   RxID:   [id number] GABAPENTIN 300 MG CAPS (GABAPENTIN) 2 by mouth three times a day  #180 x 5   Entered and Authorized by:   Corwin Levins MD   Signed by:   Corwin Levins MD on 01/04/2010   Method used:   Print then Give to Patient   RxID:   [id number] ALPRAZOLAM 0.25 MG TABS (ALPRAZOLAM) 1po two times a day as needed  #60 x 2   Entered and Authorized by:   Corwin Levins MD   Signed by:   Corwin Levins MD on 01/04/2010   Method used:   Print then Give to Patient   RxID:   [id number]    Immunization History:  Influenza Immunization History:    Influenza:  historical (08/18/2009)

## 2010-12-18 NOTE — Progress Notes (Signed)
Summary: Rx refill req  Phone Note Refill Request Message from:  Patient on August 13, 2010 11:06 AM  Refills Requested: Medication #1:  ANUCORT-HC 25 MG  SUPP 1 pr two times a day as needed   Dosage confirmed as above?Dosage Confirmed   Supply Requested: 3 months  Method Requested: Electronic Initial call taken by: Margaret Pyle, CMA,  August 13, 2010 11:06 AM  Follow-up for Phone Call        ok  noted Follow-up by: Corwin Levins MD,  August 13, 2010 3:21 PM    Prescriptions: ANUCORT-HC 25 MG  SUPP (HYDROCORTISONE ACETATE) 1 pr two times a day as needed  #20 x 2   Entered by:   Margaret Pyle, CMA   Authorized by:   Corwin Levins MD   Signed by:   Margaret Pyle, CMA on 08/13/2010   Method used:   Electronically to        [facility] [address] [phone]* (retail)       [address] [place], [place]  [address]       Ph: [phone]       Fax: [phone]   RxID:   [id number]

## 2010-12-19 ENCOUNTER — Encounter: Payer: Self-pay | Admitting: Internal Medicine

## 2010-12-19 ENCOUNTER — Other Ambulatory Visit: Payer: Self-pay | Admitting: Neurosurgery

## 2010-12-19 DIAGNOSIS — M545 Low back pain, unspecified: Secondary | ICD-10-CM

## 2010-12-25 ENCOUNTER — Ambulatory Visit
Admission: RE | Admit: 2010-12-25 | Discharge: 2010-12-25 | Disposition: A | Discharge status: Home / Self Care | Payer: Medicare Other | Source: Ambulatory Visit | Attending: Neurosurgery | Admitting: Neurosurgery

## 2010-12-25 DIAGNOSIS — M545 Low back pain, unspecified: Secondary | ICD-10-CM

## 2010-12-26 ENCOUNTER — Ambulatory Visit: Payer: Self-pay | Admitting: Ophthalmology

## 2011-01-02 ENCOUNTER — Other Ambulatory Visit: Payer: Self-pay | Admitting: Internal Medicine

## 2011-01-02 ENCOUNTER — Ambulatory Visit (INDEPENDENT_AMBULATORY_CARE_PROVIDER_SITE_OTHER): Payer: Medicare Other | Admitting: Internal Medicine

## 2011-01-02 ENCOUNTER — Other Ambulatory Visit: Payer: Medicare Other

## 2011-01-02 ENCOUNTER — Encounter (INDEPENDENT_AMBULATORY_CARE_PROVIDER_SITE_OTHER): Payer: Self-pay | Admitting: *Deleted

## 2011-01-02 ENCOUNTER — Encounter: Payer: Self-pay | Admitting: Internal Medicine

## 2011-01-02 DIAGNOSIS — R5383 Other fatigue: Secondary | ICD-10-CM

## 2011-01-02 DIAGNOSIS — I10 Essential (primary) hypertension: Secondary | ICD-10-CM

## 2011-01-02 DIAGNOSIS — Z23 Encounter for immunization: Secondary | ICD-10-CM

## 2011-01-02 DIAGNOSIS — Z01818 Encounter for other preprocedural examination: Secondary | ICD-10-CM

## 2011-01-02 DIAGNOSIS — E785 Hyperlipidemia, unspecified: Secondary | ICD-10-CM

## 2011-01-02 DIAGNOSIS — R5381 Other malaise: Secondary | ICD-10-CM

## 2011-01-02 LAB — HEPATIC FUNCTION PANEL
ALT: 22 U/L (ref 0–35)
AST: 21 U/L (ref 0–37)
Alkaline Phosphatase: 65 U/L (ref 39–117)
Bilirubin, Direct: 0.1 mg/dL (ref 0.0–0.3)
Total Bilirubin: 0.7 mg/dL (ref 0.3–1.2)
Total Protein: 7.4 g/dL (ref 6.0–8.3)

## 2011-01-02 LAB — BASIC METABOLIC PANEL
BUN: 13 mg/dL (ref 6–23)
Chloride: 104 mEq/L (ref 96–112)
Creatinine, Ser: 0.7 mg/dL (ref 0.4–1.2)
GFR: 95.09 mL/min (ref >60.00)

## 2011-01-02 LAB — LIPID PANEL
Cholesterol: 224 mg/dL — ABNORMAL HIGH (ref 0–200)
Total CHOL/HDL Ratio: 4
VLDL: 17.4 mg/dL (ref 0.0–40.0)

## 2011-01-02 LAB — LDL CHOLESTEROL, DIRECT: Direct LDL: 158.1 mg/dL

## 2011-01-03 LAB — CBC WITH DIFFERENTIAL/PLATELET
Basophils Relative: 0.3 % (ref 0.0–3.0)
Eosinophils Relative: 4.5 % (ref 0.0–5.0)
Lymphocytes Relative: 18.2 % (ref 12.0–46.0)
MCV: 95.3 fl (ref 78.0–100.0)
Monocytes Absolute: 0.2 10*3/uL (ref 0.1–1.0)
Monocytes Relative: 2.5 % — ABNORMAL LOW (ref 3.0–12.0)
Neutrophils Relative %: 74.5 % (ref 43.0–77.0)
Platelets: 203 10*3/uL (ref 150.0–400.0)
RBC: 4.18 Mil/uL (ref 3.87–5.11)
WBC: 6.2 10*3/uL (ref 4.5–10.5)

## 2011-01-09 NOTE — Assessment & Plan Note (Signed)
Summary: 6 MTH YEARLY --STC   Vital Signs:  Patient profile:   75 year old female Height:      66 inches Weight:      180.13 pounds BMI:     29.18 O2 Sat:      95 % on Room air Temp:     97.9 degrees F oral Pulse rate:   66 / minute BP sitting:   110 / 66  (left arm) Cuff size:   regular  Vitals Entered By: Zella Ball Ewing CMA Duncan Dull) (January 02, 2011 10:44 AM)  O2 Flow:  Room air  Preventive Care Screening  Last Flu Shot:    Date:  09/18/2010    Results:  given   Mammogram:    Date:  02/16/2010    Results:  normal      dxa per GYN  CC: Yearly/RE   CC:  Yearly/RE.  History of Present Illness: here to f/u;  overall doing ok;  did see Dr Poole/NS (after seeing Dr Roosevelt Locks) with f/u LBP after pain which started 2 yrs ago, now s/p ESI x 6;  may need surgury per Dr Dutch Quint - pt seen yesterday and rec'd for lumbar fusion.  Had right cataract surgury last wk - doing well; but bothers here that she has blurry vision still on the left but the cataract on the left not yet mature, and no surgury planned.  ECG just done last wk - pt declines further today  Pt denies CP, worsening sob, doe, wheezing, orthopnea, pnd, worsening LE edema, palps, dizziness or syncope Pt denies new neuro symptoms such as headache, facial or extremity weakness Pt denies polydipsia, polyuria  Overall good compliance with meds, trying to follow low chol diet, wt stable, little excercise however .  Overall good compliance with meds, and good tolerability.  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  He seems more stable now, so she is going to schedule the surgury hopefully in the next 1-2  mo.  Pt states good ability with ADL's, low fall risk, home safety  reviewed and adequate, no significant change in hearing or vision except for the above, trying to follow lower chol diet, and occasionally active only with regular excercise.  Does have some fatigue, but denies above, as well as daytime somnolence  Problems Prior to Update: 1)  Preventive Health Care  (ICD-V70.0) 2)  Chest Pain  (ICD-786.50) 3)  Disc Disease, Lumbar  (ICD-722.52) 4)  Fatigue  (ICD-780.79) 5)  Sciatica, Left  (ICD-724.3) 6)  Diverticulosis, Colon  (ICD-562.10) 7)  Hyperlipidemia  (ICD-272.4) 8)  Chalazion  (ICD-373.2) 9)  Paresthesia, Hands  (ICD-782.0) 10)  Hypertension  (ICD-401.9) 11)  Osteopenia  (ICD-733.90) 12)  Dvt, Hx of  (ICD-V12.51) 13)  Gerd  (ICD-530.81) 14)  Depression  (ICD-311)  Medications Prior to Update: 1)  Gabapentin 300 Mg Caps (Gabapentin) .... 2 By Mouth Three Times A Day 2)  Hydrochlorothiazide 25 Mg  Tabs (Hydrochlorothiazide) .... Take 1/2 Tab By Mouth Every Morning 3)  Fexofenadine Hcl 180 Mg  Tabs (Fexofenadine Hcl) .Marland Kitchen.. 1 By Mouth Once Daily As Needed Allergies 4)  Acidophilus   Tabs (Lactobacillus) .... Take 1 Tablet By Mouth Once A Day 5)  Fish Oil 1000 Mg  Caps (  Omega-3 Fatty Acids) .... Take 1 Tablet By Mouth Once A Day 6)  Sertraline Hcl 100 Mg  Tabs (Sertraline Hcl) .... 2 By Mouth Once Daily 7)  Clobetasol Propionate E 0.05 %  Crea (Clobetasol Prop Emollient Base) 8)  Fluconazole 100 Mg  Tabs (Fluconazole) .Marland Kitchen.. 1 By Mouth Once Daily For 3 Days As Needed 9)  Anucort-Hc 25 Mg  Supp (Hydrocortisone Acetate) .Marland Kitchen.. 1 Pr Two Times A Day As Needed 10)  Estrace 0.1 Mg/gm  Crea (Estradiol) 11)  Xalatan 0.005 %  Soln (Latanoprost) .Marland Kitchen.. 1 Gtt Each Eye Qhs 12)  Nitrofurantoin Macrocrystal 100 Mg Caps (Nitrofurantoin Macrocrystal) .Marland Kitchen.. 1 By Mouth At Bedtime 13)  Adult Aspirin Ec Low Strength 81 Mg Tbec (Aspirin) .Marland Kitchen.. 1 By Mouth Once Daily 14)  Alprazolam 0.25 Mg Tabs (Alprazolam) .Marland Kitchen.. 1po Two Times A Day As Needed 15)  Proctozone-Hc 2.5 % Crea  (Hydrocortisone) .... Use Asd Two Times A Day As Needed  Current Medications (verified): 1)  Gabapentin 300 Mg Caps (Gabapentin) .... 2 By Mouth Three Times A Day 2)  Hydrochlorothiazide 25 Mg  Tabs (Hydrochlorothiazide) .... Take 1/2 Tab By Mouth Every Morning 3)  Fexofenadine Hcl 180 Mg  Tabs (Fexofenadine Hcl) .Marland Kitchen.. 1 By Mouth Once Daily As Needed Allergies 4)  Acidophilus   Tabs (Lactobacillus) .... Take 1 Tablet By Mouth Once A Day 5)  Fish Oil 1000 Mg  Caps (Omega-3 Fatty Acids) .... Take 1 Tablet By Mouth Once A Day 6)  Sertraline Hcl 100 Mg  Tabs (Sertraline Hcl) .... 2 By Mouth Once Daily 7)  Clobetasol Propionate E 0.05 %  Crea (Clobetasol Prop Emollient Base) 8)  Fluconazole 100 Mg  Tabs (Fluconazole) .Marland Kitchen.. 1 By Mouth Once Daily For 3 Days As Needed 9)  Anucort-Hc 25 Mg  Supp (Hydrocortisone Acetate) .Marland Kitchen.. 1 Pr Two Times A Day As Needed 10)  Estrace 0.1 Mg/gm  Crea (Estradiol) 11)  Xalatan 0.005 %  Soln (Latanoprost) .Marland Kitchen.. 1 Gtt Each Eye Qhs 12)  Nitrofurantoin Macrocrystal 100 Mg Caps (Nitrofurantoin Macrocrystal) .Marland Kitchen.. 1 By Mouth At Bedtime 13)  Adult Aspirin Ec Low Strength 81 Mg Tbec (Aspirin) .Marland Kitchen.. 1 By Mouth Once Daily 14)  Alprazolam 0.25 Mg Tabs (Alprazolam) .Marland Kitchen.. 1po Two Times A Day As Needed 15)  Proctozone-Hc 2.5 % Crea (Hydrocortisone) .... Use Asd Two Times A Day As Needed  Allergies (verified): 1)  Codeine 2)  * Avelox 3)  Penicillin 4)  * Mirapex 5)  * Requip  Past History:  Past Medical History: Last updated: 06/22/2008 Depression GERD Glaucoma Right Hepatic Cyst Restless Leg Syndrome DVT, hx of Osteopenia Left breast lump status post biopsy April of 2008 ( negative for malignancy) Hyperlipidemia Diverticulosis, colon hx of pna - hosp'd 2/08  Family History: Last updated: 06/21/2009 mother has hypertension, Alzheimer's, and Parkinson's disease. Father has prostate cancer sister with alzehimer's  Social History: Last updated: 07/05/2010 Retired -  Data processing manager Married Never Smoked Alcohol use-no she has two children Drug use-no  Risk Factors: Smoking Status: never (10/30/2007)  Past Surgical History: Tubal ligation Knee, foot surgery Lumpectomy April 2008. s/p right cataract feb 2012  Review of Systems       The patient complains of difficulty walking.  The patient denies anorexia, fever, vision loss, decreased hearing, hoarseness, chest pain, syncope, dyspnea on exertion, peripheral edema, prolonged cough, headaches, hemoptysis, abdominal pain, melena, hematochezia, severe indigestion/heartburn, hematuria, muscle weakness, suspicious skin lesions, transient blindness, depression, unusual weight change, abnormal bleeding, enlarged lymph nodes, and  angioedema.         all otherwise negative per pt -    Physical Exam  General:  alert and overweight-appearing.   Head:  normocephalic and atraumatic.   Eyes:  vision grossly intact, pupils equal, and pupils round.   Ears:  R ear normal and L ear normal.   Nose:  no external deformity and no nasal discharge.   Mouth:  no gingival abnormalities and pharynx pink and moist.   Neck:  supple and no masses.   Lungs:  normal respiratory effort and normal breath sounds.   Heart:  normal rate and regular rhythm.   Abdomen:  soft, non-tender, and normal bowel sounds.   Msk:  no joint tenderness and no joint swelling. , back exam not done in detail Neurologic:  strength normal in all extremities and sensation intact to light touch.   Skin:  color normal and no rashes.   Psych:  dysphoric affect and slightly anxious.     Impression & Recommendations:  Problem # 1:  FATIGUE (ICD-780.79) exam benign, to check labs below; follow with expectant management  Orders: TLB-BMP (Basic Metabolic Panel-BMET) (80048-METABOL) TLB-CBC Platelet - w/Differential (85025-CBCD) TLB-Hepatic/Liver Function Pnl (80076-HEPATIC) TLB-TSH (Thyroid Stimulating Hormone) (84443-TSH)  Problem # 2:   HYPERTENSION (ICD-401.9)  Her updated medication list for this problem includes:    Hydrochlorothiazide 25 Mg Tabs (Hydrochlorothiazide) .Marland Kitchen... Take 1/2 tab by mouth every morning  BP today: 110/66 Prior BP: 110/70 (07/05/2010)  Labs Reviewed: K+: 3.7 (06/21/2009) Creat: : 0.7 (06/21/2009)   Chol: 170 (06/21/2009)   HDL: 43.20 (06/21/2009)   LDL: 113 (06/21/2009)   TG: 67.0 (06/21/2009) stable overall by hx and exam, ok to continue meds/tx as is   Problem # 3:  HYPERLIPIDEMIA (ICD-272.4)  Orders: TLB-Lipid Panel (80061-LIPID)  Labs Reviewed: SGOT: 18 (06/21/2009)   SGPT: 17 (06/21/2009)   HDL:43.20 (06/21/2009), 37.5 (06/08/2008)  LDL:113 (06/21/2009), 134 (06/08/2008)  Chol:170 (06/21/2009), 185 (06/08/2008)  Trig:67.0 (06/21/2009), 66 (06/08/2008) stable overall by hx and exam, ok to continue meds/tx as is , Pt to continue diet efforts, good med tolerance; to check labs - goal LDL less than 100  Problem # 4:  PREOPERATIVE EXAMINATION (ICD-V72.84) cleared for surgury medically pending labs above;  pt declines cardiac stress test due to lack of symptoms and FH, I estimate her overall risk to be mild to mod given her age and risk factors (HTN, elev chol)  Complete Medication List: 1)  Gabapentin 300 Mg Caps (Gabapentin) .... 2 by mouth three times a day 2)  Hydrochlorothiazide 25 Mg Tabs (Hydrochlorothiazide) .... Take 1/2 tab by mouth every morning 3)  Fexofenadine Hcl 180 Mg Tabs (Fexofenadine hcl) .Marland Kitchen.. 1 by mouth once daily as needed allergies 4)  Acidophilus Tabs (Lactobacillus) .... Take 1 tablet by mouth once a day 5)  Fish Oil 1000 Mg Caps (Omega-3 fatty acids) .... Take 1 tablet by mouth once a day 6)  Sertraline Hcl 100 Mg Tabs (Sertraline hcl) .... 2 by mouth once daily 7)  Clobetasol Propionate E 0.05 % Crea (Clobetasol prop emollient base) 8)  Fluconazole 100 Mg Tabs (Fluconazole) .Marland Kitchen.. 1 by mouth once daily for 3 days as needed 9)  Anucort-hc 25 Mg Supp (Hydrocortisone  acetate) .Marland Kitchen.. 1 pr two times a day as needed 10)  Estrace 0.1 Mg/gm Crea (Estradiol) 11)  Xalatan 0.005 % Soln (Latanoprost) .Marland Kitchen.. 1 gtt each eye qhs 12)  Nitrofurantoin Macrocrystal 100 Mg Caps (Nitrofurantoin macrocrystal) .Marland Kitchen.. 1 by mouth at bedtime 13)  Adult Aspirin Ec Low Strength 81 Mg Tbec (Aspirin) .Marland Kitchen.. 1 by mouth once daily 14)  Alprazolam 0.25 Mg Tabs (Alprazolam) .Marland Kitchen.. 1po two times a day as needed 15)  Proctozone-hc 2.5 % Crea (Hydrocortisone) .... Use asd two times a day as needed  Other Orders: Pneumococcal Vaccine (04540) Admin 1st Vaccine (98119)  Patient Instructions: 1)  you had the pneumonia shot today 2)  please remember to have your bone density, pap smear, and mamogram in April 2012 per your GYN 3)  Please go to the Lab in the basement for your blood and/or urine tests today 4)  Please call the number on the [facility] for results of your testing 5)  you are given the refills today 6)  Please schedule a follow-up appointment in 1 year or sooner if needed 7)  Good Luck with your back surgury after march! Prescriptions: PROCTOZONE-HC 2.5 % CREA (HYDROCORTISONE) use asd two times a day as needed  #1large x 1   Entered and Authorized by:   Corwin Levins MD   Signed by:   Corwin Levins MD on 01/02/2011   Method used:   Print then Give to Patient   RxID:   [id number] ALPRAZOLAM 0.25 MG TABS (ALPRAZOLAM) 1po two times a day as needed  #60 x 2   Entered and Authorized by:   Corwin Levins MD   Signed by:   Corwin Levins MD on 01/02/2011   Method used:   Print then Give to Patient   RxID:   [id number] ANUCORT-HC 25 MG  SUPP (HYDROCORTISONE ACETATE) 1 pr two times a day as needed  #20 x 2   Entered and Authorized by:   Corwin Levins MD   Signed by:   Corwin Levins MD on 01/02/2011   Method used:   Print then Give to Patient   RxID:   [id number] FLUCONAZOLE 100 MG  TABS (FLUCONAZOLE) 1 by mouth once daily for 3 days as needed  #3 x 1   Entered and Authorized  by:   Corwin Levins MD   Signed by:   Corwin Levins MD on 01/02/2011   Method used:   Print then Give to Patient   RxID:   [id number] SERTRALINE HCL 100 MG  TABS (SERTRALINE HCL) 2 by mouth once daily  #180 x 3   Entered and Authorized by:   Corwin Levins MD   Signed by:   Corwin Levins MD on 01/02/2011   Method used:   Print then Give to Patient   RxID:   [id number] FEXOFENADINE HCL 180 MG  TABS (FEXOFENADINE HCL) 1 by mouth once daily as needed allergies  #90 x 3   Entered and Authorized by:   Corwin Levins MD   Signed by:   Corwin Levins MD on 01/02/2011   Method used:   Print then Give to Patient   RxID:   [id number] HYDROCHLOROTHIAZIDE 25 MG  TABS (HYDROCHLOROTHIAZIDE) Take 1/2 tab by mouth every morning  #100 x 3   Entered and Authorized by:   Corwin Levins MD   Signed by:   Corwin Levins MD on 01/02/2011   Method used:   Print then Give to Patient   RxID:   [id number] GABAPENTIN 300 MG CAPS (GABAPENTIN) 2 by mouth three times a day  #180 x 5   Entered and Authorized by:   Corwin Levins MD   Signed by:   Fayrene Fearing  Ellin Mayhew MD on 01/02/2011   Method used:   Print then Give to Patient   RxID:   [id number]    Orders Added: 1)  TLB-BMP (Basic Metabolic Panel-BMET) [80048-METABOL] 2)  TLB-CBC Platelet - w/Differential [85025-CBCD] 3)  TLB-Hepatic/Liver Function Pnl [80076-HEPATIC] 4)  TLB-TSH (Thyroid Stimulating Hormone) [84443-TSH] 5)  TLB-Lipid Panel [80061-LIPID] 6)  Pneumococcal Vaccine [90732] 7)  Admin 1st Vaccine [90471] 8)  Est. Patient Level IV [14782]   Immunizations Administered:  Pneumonia Vaccine:    Vaccine Type: Pneumovax    Site: left deltoid    Mfr: Merck    Dose: 0.5 ml    Route: IM    Given by: Zella Ball Ewing CMA (AAMA)    Exp. Date: 04/11/2012    Lot #: [id number]    VIS given: 10/23/09 version given January 02, 2011.   Immunizations Administered:  Pneumonia Vaccine:    Vaccine Type: Pneumovax    Site: left deltoid    Mfr: Merck     Dose: 0.5 ml    Route: IM    Given by: Zella Ball Ewing CMA (AAMA)    Exp. Date: 04/11/2012    Lot #: [id number]    VIS given: 10/23/09 version given January 02, 2011.

## 2011-01-15 NOTE — Consult Note (Signed)
Summary: [facility]   Imported By: Sherian Rein 01/07/2011 08:16:28  _____________________________________________________________________  External Attachment:    Type:   Image     Comment:   External Document

## 2011-02-05 ENCOUNTER — Ambulatory Visit (HOSPITAL_COMMUNITY)
Admission: RE | Admit: 2011-02-05 | Discharge: 2011-02-05 | Disposition: A | Discharge status: Home / Self Care | Payer: Medicare Other | Source: Ambulatory Visit | Attending: Neurosurgery | Admitting: Neurosurgery

## 2011-02-05 ENCOUNTER — Encounter (HOSPITAL_COMMUNITY)
Admission: RE | Admit: 2011-02-05 | Discharge: 2011-02-05 | Disposition: A | Discharge status: Home / Self Care | Payer: Medicare Other | Source: Ambulatory Visit | Attending: Neurosurgery | Admitting: Neurosurgery

## 2011-02-05 ENCOUNTER — Other Ambulatory Visit (HOSPITAL_COMMUNITY): Payer: Self-pay | Admitting: Neurosurgery

## 2011-02-05 DIAGNOSIS — M48061 Spinal stenosis, lumbar region without neurogenic claudication: Secondary | ICD-10-CM

## 2011-02-05 DIAGNOSIS — Z01818 Encounter for other preprocedural examination: Secondary | ICD-10-CM | POA: Insufficient documentation

## 2011-02-05 DIAGNOSIS — Z01812 Encounter for preprocedural laboratory examination: Secondary | ICD-10-CM | POA: Insufficient documentation

## 2011-02-05 DIAGNOSIS — Z01810 Encounter for preprocedural cardiovascular examination: Secondary | ICD-10-CM | POA: Insufficient documentation

## 2011-02-05 LAB — BASIC METABOLIC PANEL
Calcium: 9.4 mg/dL (ref 8.4–10.5)
Chloride: 100 mEq/L (ref 96–112)
Creatinine, Ser: 0.74 mg/dL (ref 0.4–1.2)
GFR calc Af Amer: >60 mL/min (ref >60)
Sodium: 140 mEq/L (ref 135–145)

## 2011-02-05 LAB — CBC
Hemoglobin: 13.2 g/dL (ref 12.0–15.0)
MCH: 30.6 pg (ref 26.0–34.0)
Platelets: 214 10*3/uL (ref 150–400)
RBC: 4.31 MIL/uL (ref 3.87–5.11)

## 2011-02-05 LAB — SURGICAL PCR SCREEN
MRSA, PCR: POSITIVE — AB
Staphylococcus aureus: POSITIVE — AB

## 2011-02-12 ENCOUNTER — Inpatient Hospital Stay (HOSPITAL_COMMUNITY): Payer: Medicare Other

## 2011-02-12 ENCOUNTER — Inpatient Hospital Stay (HOSPITAL_COMMUNITY)
Admission: RE | Admit: 2011-02-12 | Discharge: 2011-02-13 | DRG: 460 | Disposition: A | Discharge status: Home / Self Care | Payer: Medicare Other | Source: Ambulatory Visit | Attending: Neurosurgery | Admitting: Neurosurgery

## 2011-02-12 DIAGNOSIS — F341 Dysthymic disorder: Secondary | ICD-10-CM | POA: Diagnosis present

## 2011-02-12 DIAGNOSIS — M549 Dorsalgia, unspecified: Secondary | ICD-10-CM | POA: Diagnosis present

## 2011-02-12 DIAGNOSIS — M48061 Spinal stenosis, lumbar region without neurogenic claudication: Principal | ICD-10-CM | POA: Diagnosis present

## 2011-02-12 DIAGNOSIS — M4120 Other idiopathic scoliosis, site unspecified: Secondary | ICD-10-CM | POA: Diagnosis present

## 2011-02-12 DIAGNOSIS — Z880 Allergy status to penicillin: Secondary | ICD-10-CM

## 2011-02-12 HISTORY — PX: LUMBAR FUSION: SHX111

## 2011-02-12 LAB — TYPE AND SCREEN: ABO/RH(D): AB POS

## 2011-02-13 NOTE — Op Note (Signed)
NAMEVALIA, [name]                 ACCOUNT NO.:  [id number]  MEDICAL RECORD NO.:  [id number]           PATIENT TYPE:  I  LOCATION:  3524                         FACILITY:  [facility]  PHYSICIAN:  Kathaleen Maser. Norah Devin, M.D.    DATE OF BIRTH:  Jan 03, 1936  DATE OF PROCEDURE:  02/12/2011 DATE OF DISCHARGE:                              OPERATIVE REPORT   PREOPERATIVE DIAGNOSIS:  L3-4 scoliosis and stenosis with back pain and radiculopathy.  POSTOPERATIVE DIAGNOSIS:  L3-4 scoliosis and stenosis with back pain and radiculopathy.  PROCEDURE NAME:  Left L3-4 retroperitoneal anterolateral interbody fusion utilizing PEEK cage instrumentation, bone graft substitute.  Left L3-4 nonsegmental percutaneous pedicle screw fixation.  SURGEON:  Kathaleen Maser. Twana Wileman, MD  ASSISTANT:  Reinaldo Meeker, MD  ANESTHESIA:  General endotracheal.  INDICATIONS:  [name] is a 75 year old female with history of severe left lower extremity pain, paresthesias, and weakness of the left-sided L3 radiculopathy failing conservative measurement.  Workup demonstrates evidence of marked foraminal collapse and stenosis secondary to degenerative scoliosis at the L3-4 level.  The patient has failed conservative management who presents now for fusion in hopes of improving her symptoms.  OPERATIVE NOTE:  The patient was brought to the operating room and placed on the operating table in supine position.  After adequate level of anesthesia was achieved, the patient was positioned in the right lateral decubitus position.  She was appropriately padded.  The patient was prepped for neuro monitoring.  After the bed was suitably positioned, 2 incisions were made in the left flank.  Through the posterior incision, a blunt pass was made into the retroperitoneal space.  The retroperitoneal structures were swept anteriorly and a dilating probe was then passed through the anterior incision overlying the L3-4 disk space.  This was passed down to  the psoas muscle.  Test stimulation of the psoas revealed complete reversal of muscle relaxation.  The dilator was then sequentially passed through the psoas into the lateral aspect of the disk space at L3-4.  This was done under continuous neuro monitoring.  There was no evidence of neural structures directly adjacent to the dilating probe.  The dilating probe was then secured with a K-wire and then the probe was dilated sequentially.  Once again, neuro monitoring was done along all steps.  The self-retaining retractor was then passed over the dilating probes and brought into position and locked in place at the L3-4 level.  Once again, neuro monitoring confirmed good position of this.  The dilator and K-wire were removed.  The operative field was stimulated and there were no neural structures along the lateral aspect of vertebral bodies of disk space. A shim was placed.  Diskectomy was then performed at L3-4. Contralateral releasing was performed.  After vigorous curettage of the endplates, the interspace was sized and an 8-mm standard implant was found to be the most appropriate size given her level of collapse.  An 8 x 22 x 50-mm implant was then packed with Actifuse and Osteocel Plus. This was then packed into place under fluoroscopic guidance.  The impactor was removed.  C-arm revealed good  position of the graft.  The self-retaining retractor was slowly removed.  Hemostasis was checked along the way and found to be good.  Good position of the implant was confirmed on both the AP and lateral planes.  Both wounds were then irrigated and closed in a typical fashion.  Steri-Strips and sterile dressings were applied.  Attention was then placed to the posterior region of the patient's lumbar region.  Using C-arm guidance, appropriate entry sites for percutaneous pedicle screw placement were confirmed at the L3-4 level.  Stab incisions were made at both levels. A Jamshidi introducer was  then passed into the pedicle utilizing continuous neuro monitoring and continuous fluoroscopy.  After placement of the Jamshidi cannulas at both levels, guidewires were placed and the Jamshidi needles were removed.  The pedicle tracts were then tapped using a 6.0-mm screw tap once again utilizing continuous neuromonitoring without evidence of adverse event.  The tap was removed.  The 6.5 x 40- mm percutaneous NuVasive screws were then placed at both L3 and L4. This was once again done under neuro monitoring.  After the heads were placed in a suitable position, a subfascial pass with a 45-mm lordosed rod was then made between the screw heads.  This was then provisionally secured and then the locking caps were placed first on the inferior and then the superior region.  The screws were given a final tightening with the construct under gentle compression.  Final images revealed good position of bone grafts and hardware at proper op level with normal alignment of spine.  Wound was then irrigated with antibiotic solution. Hemostasis was achieved with the bipolar electrocautery.  Wound was then closed in a typical fashion.  Steri-Strips and sterile dressing were applied on all wounds.  There were no complications.  The patient tolerated the procedure well and she returned to the recovery room postoperatively.          ______________________________ Kathaleen Maser Kemo Spruce, M.D.     HAP/MEDQ  D:  02/12/2011  T:  02/13/2011  Job:  [id number]  Electronically Signed by Julio Sicks M.D. on 02/13/2011 04:06:38 PM

## 2011-03-03 ENCOUNTER — Other Ambulatory Visit: Payer: Self-pay | Admitting: Internal Medicine

## 2011-03-04 NOTE — Telephone Encounter (Signed)
To robin   

## 2011-03-04 NOTE — Telephone Encounter (Signed)
Refill request for Zoloft.

## 2011-03-04 NOTE — Telephone Encounter (Signed)
Refill Request Sertraline

## 2011-03-14 ENCOUNTER — Ambulatory Visit
Admission: RE | Admit: 2011-03-14 | Discharge: 2011-03-14 | Disposition: A | Discharge status: Home / Self Care | Payer: Medicare Other | Source: Ambulatory Visit | Attending: Neurosurgery | Admitting: Neurosurgery

## 2011-03-14 ENCOUNTER — Other Ambulatory Visit: Payer: Self-pay | Admitting: Neurosurgery

## 2011-03-14 DIAGNOSIS — M545 Low back pain: Secondary | ICD-10-CM

## 2011-03-18 ENCOUNTER — Other Ambulatory Visit: Payer: Self-pay | Admitting: Internal Medicine

## 2011-03-31 ENCOUNTER — Encounter: Payer: Self-pay | Admitting: Internal Medicine

## 2011-03-31 DIAGNOSIS — Z0000 Encounter for general adult medical examination without abnormal findings: Secondary | ICD-10-CM | POA: Insufficient documentation

## 2011-04-02 NOTE — Op Note (Signed)
[name], [name]                 ACCOUNT NO.:  [id number]   MEDICAL RECORD NO.:  [id number]          PATIENT TYPE:  AMB   LOCATION:  DSC                          FACILITY:  [facility]   PHYSICIAN:  Currie Paris, M.D.DATE OF BIRTH:  [birth year]   DATE OF PROCEDURE:  04/10/2007  DATE OF DISCHARGE:                               OPERATIVE REPORT   OFFICE MEDICAL RECORD NUMBER [id number] [phone].   PREOPERATIVE DIAGNOSIS:  Calcifications with atypical hyperplasia on  core biopsy, left breast.   POSTOPERATIVE DIAGNOSIS:  Calcifications with atypical hyperplasia on  core biopsy, left breast.   OPERATIONS:  Needle-guided excision of left breast calcifications.   SURGEON:  Currie Paris, M.D.   ANESTHESIA:  General.   CLINICAL HISTORY:  This is a 75 year old lady who had some abnormal  calcifications seen and biopsy done.  There was some atypical  hyperplasia, and a wider excisional biopsy was recommended.   DESCRIPTION OF PROCEDURE:  The patient was seen in the holding area and  had no further questions.  She confirmed that the needle-guided left  breast biopsy was the planned procedure.  We both initialed the left  breast as the operative side.  The guidewire had already been placed,  and I reviewed those films.   The patient was taken to the operating room, and after satisfactory  general anesthesia had been obtained, the left breast was prepped and  draped.  The time out occurred.   The guidewire entered laterally at about the 3:15 position and seemed to  track a little bit medially and superiorly.  We were just a little bit  away from the areolar edge so I elected to make a circumareolar incision  at the areolar margin.  I then elevated a little skin flap over to the  guidewire and manipulated the guidewire into the wound.  I then grasped  the apparent tract of the guidewire with some Allis clamps and did an  excisional biopsy, taking a cylinder of tissue about 1 cm in all  directions around the guidewire and to beyond the tip.  This was sent  for specimen mammography.   I made sure everything was dry.  We irrigated, injected some Marcaine,  and then rechecked for hemostasis.  Finally, the incision was closed in  layers with 3-0 Vicryl followed by 4-0 Monocryl subcuticular and some  Dermabond on the skin.   The patient tolerated the procedure well.  There were no operative  complications.  All counts were correct.  Radiology reported that the  clip was contained in the specimen mammogram.      Currie Paris, M.D.  Electronically Signed     CJS/MEDQ  D:  04/10/2007  T:  04/10/2007  Job:  [id number]   cc:   Barbette Hair. Artist Pais, DO

## 2011-04-03 ENCOUNTER — Encounter: Payer: Self-pay | Admitting: Internal Medicine

## 2011-04-03 ENCOUNTER — Other Ambulatory Visit (INDEPENDENT_AMBULATORY_CARE_PROVIDER_SITE_OTHER): Payer: Medicare Other

## 2011-04-03 ENCOUNTER — Ambulatory Visit (INDEPENDENT_AMBULATORY_CARE_PROVIDER_SITE_OTHER): Payer: Medicare Other | Admitting: Internal Medicine

## 2011-04-03 DIAGNOSIS — F329 Major depressive disorder, single episode, unspecified: Secondary | ICD-10-CM

## 2011-04-03 DIAGNOSIS — E785 Hyperlipidemia, unspecified: Secondary | ICD-10-CM

## 2011-04-03 DIAGNOSIS — F3289 Other specified depressive episodes: Secondary | ICD-10-CM

## 2011-04-03 DIAGNOSIS — Z79899 Other long term (current) drug therapy: Secondary | ICD-10-CM

## 2011-04-03 DIAGNOSIS — I10 Essential (primary) hypertension: Secondary | ICD-10-CM

## 2011-04-03 LAB — LIPID PANEL
Cholesterol: 158 mg/dL (ref 0–200)
LDL Cholesterol: 88 mg/dL (ref 0–99)
Total CHOL/HDL Ratio: 3

## 2011-04-03 LAB — HEPATIC FUNCTION PANEL
ALT: 18 U/L (ref 0–35)
AST: 22 U/L (ref 0–37)
Total Bilirubin: 0.4 mg/dL (ref 0.3–1.2)

## 2011-04-03 NOTE — Patient Instructions (Signed)
Continue all other medications as before Please go to LAB in the Basement for the blood and/or urine tests to be done today Please call the phone number [phone] (the [employer]Tree System) for results of testing in 2-3 days;  When calling, simply dial the number, and when prompted enter the MRN number above (the Medical Record Number) and the # key, then the message should start. Please return in 1 year for your yearly visit, or sooner if needed

## 2011-04-03 NOTE — Progress Notes (Signed)
Quick Note:  Voice message left on PhoneTree system - lab is negative, normal or otherwise stable, pt to continue same tx ______ 

## 2011-04-05 ENCOUNTER — Telehealth: Payer: Self-pay | Admitting: *Deleted

## 2011-04-05 NOTE — Telephone Encounter (Signed)
Pt unable to obtain results from phone tree.. Called and inform Pt.

## 2011-04-05 NOTE — Assessment & Plan Note (Signed)
[facility]                           PRIMARY CARE OFFICE NOTE   NAME:[name], [name]                        MRN:          [id number]  DATE:01/05/2007                            DOB:          1936/08/21    HISTORY AND PHYSICAL   CHIEF COMPLAINT:  Fever.   HISTORY OF PRESENT ILLNESS:  The patient is a 75 year old, white female  who presents with a fever for the last 3 to 4 days.  She was initially  seen at [facility], approximately 8 days ago, regarding  symptoms of fatigue and upper respiratory symptoms.  She was diagnosed  with possible sinusitis and started on Avelox 400 mg once a day.  Despite taking antibiotics, she reports fevers 101 to 102 degrees  Fahrenheit over Saturday and Sunday.  She also reports some loose  stools.  She had 4 bowel movements yesterday.  None this morning.   She also has associated headache.  Denies any neck stiffness.  She did  have some recent dental work at the end of January.   PAST MEDICAL HISTORY SUMMARY:  1. Depression.  2. Gastroesophageal reflux disease.  3. History of large right hepatic cyst status post surgical drainage      in October 2006.  4. Restless leg syndrome.  5. History of osteopenia.  6. Remote history of deep venous thrombosis.  7. Tubal ligation in 1973.   CURRENT MEDICATIONS:  1. Caltrate with vitamin D 1 daily.  2. Lexapro 10 mg 1 daily.  3. Gabapentin 300 mg 2 nightly.  4. Hydrochlorothiazide 12.5 mg q.a.m.  5. Singulair 10 mg nightly.   ALLERGIES TO MEDICATIONS:  INCLUDES CODEINE WHICH CAUSES NAUSEA.  PENICILLIN HAS CAUSED YEAST INFECTIONS.  MIRAPEX AND REQUIP CAUSE  INSOMNIA.   SOCIAL HISTORY:  No alcohol. No tobacco.  She is married, retired, lives  with her husband, has 2 children.   FAMILY HISTORY:  Mother has hypertension, Alzheimer's, and Parkinson's  disease.  Father has prostate cancer.   REVIEW OF SYSTEMS:  As noted above, no melena or dark stools.  No  chest  pain.  All other systems negative.   PHYSICAL EXAMINATION:  VITAL SIGNS:  Weight is 199 pounds.  Temperature  is 97.  Pulse is 78.  BP is 100/60.  GENERAL:  The patient is a pleasant, 75 year old white female, nontoxic.  HEENT:  Normocephalic, atraumatic.  Pupils were equal and reactive to  light bilaterally.  Extraocular muscles intact.  The patient was  anicteric.  No conjunctivae petechia noted.  Oropharyngeal exam was  unremarkable.  NECK:  Supple.  No adenopathy or carotid bruits or thyromegaly.  CHEST:  Normal inspiratory effort. Chest was clear to auscultation  bilaterally.  No rhonchi, rales, or wheezing.  CARDIOVASCULAR:  Regular rate and rhythm.  No significant murmurs, rubs,  or gallops appreciated.  ABDOMEN:  Soft.  The patient had mild diffuse abdominal tenderness,  greater in the right upper quadrant.  MUSCULOSKELETAL:  No clubbing, cyanosis, or edema.  No splinter  hemorrhages or Osler or Janeway lesions.  SKIN:  Warm and dry.  NEUROLOGIC:  Cranial nerves II through XII were grossly intact.   LABORATORY DATA:  CBC shows a WBC 10.4.  H and H of 14.2, hematocrit  42.1.  Platelet count of 145.  The patient did have a left shift with  90.4% neutrophils.  Basic metabolic profile was notable for mild  hypokalemia at 3.3 and creatinine at 1.7.  Baseline is 0.8 to 0.9  Transaminases were unremarkable.  UA was positive for bilirubin and  ketones and mild protein 100 mg per dL.   IMPRESSION/RECOMMENDATIONS:  1. Fever.  2. Headache.  3. Diarrhea.  4. Dehydration.  5. History of right hepatic cyst.  6. History of depression.   RECOMMENDATIONS:  The patient's etiology of fever unclear at this time.  The patient is certainly at risk for having C. difficile colitis with  recent Avelox use. We will discontinue Avelox.  Blood cultures were  obtained and nasal swab for influenza.  There is a possibility of  endocarditis and we will consult infectious disease.  We will  check a  sedimentation rate, uric acid, and thyroid studies, and hydrate with IV  fluids.     Barbette Hair. Artist Pais, DO  Electronically Signed    RDY/MedQ  DD: 01/06/2007  DT: 01/06/2007  Job #: [id number]

## 2011-04-05 NOTE — Discharge Summary (Signed)
NAME:  [name], [name]                     ACCOUNT NO.:  ll   MEDICAL RECORD NO.:  [id number]          PATIENT TYPE:  INP   LOCATION:  5523                         FACILITY:  [facility]   PHYSICIAN:  Georgina Quint. Plotnikov, MDDATE OF BIRTH:  [birth year]   DATE OF ADMISSION:  01/06/2007  DATE OF DISCHARGE:  01/10/2007                               DISCHARGE SUMMARY   DISCHARGE MEDICATIONS:  1. Anusol HC cream to use b.i.d. for 10 days.  2. Resume other home medicines.   DISCHARGE DIAGNOSES:  1. Fever likely related to problem #2.  2. Right upper lobe infiltrate suspicious for pneumonia in the right      upper lobe; CT scan from January 09, 2007 with right sided      adenopathy and bilateral small pleural effusions.  This pneumonia      was treated with about 7 days of Avelox from February 10 to      January 05, 2007.  3. Renal insufficiency.  4. Hypokalemia, corrected.  5. Gastroesophageal reflux disease.  6. Depression.  7. History of large liver cyst.  8. Osteopenia.  9. Mild thrombocytopenia.  10.Diarrhea, Clostridium difficile negative, colitis resolved.  11.Large liver cyst 16.6 cm in transverse size increased from      previous; consider invasive radiology consultation as an      outpatient.   CONSULTATION:  ID consultation, Dr. Orvan Falconer.   HISTORY:  The patient is a 75 year old female who was admitted by Dr.  Artist Pais on January 06, 2007.  For the details, please address to his  history and physical.   HOSPITAL COURSE:  During the course of hospitalization, the patient was  observed for her fevers.  Dr. Orvan Falconer saw her in a consultation and  recommended not to use antibiotics.  Since the fever was potentially  considered to be related to Avelox therapy.  She developed diarrhea that  subsequently resolved.  She had exacerbation of her hemorrhoids treated.  CT scan was obtained for the episode of low O2 SATs.  CT scan of the  abdomen confirmed the right large hepatic cyst.   On the day of discharge, she is feeling well, no complaints.  She is  slightly tired.  Denies chest pain and shortness of breath.  No sweats,  chills or fever.  Heart rate 82, respirations 16, blood pressure 119,  74, SATs 92% on room air, temperature 97.1, T max is 98.6.  The lungs  are clear.  Heart is regular.  Abdomen is soft, nontender.  Lower  extremities without edema.  She is awake and cooperative.  Labs on  January 09, 2007:  White count 7.2, hemoglobin 12.4, sodium 138,  creatinine 0.94, Clostridium difficile test is negative.  CT scan of the  abdomen without contrast on January 05, 2007 showed descending and  sigmoid diverticulosis without diverticulitis and some interval increase  in size of large right hepatic cyst, which is now 16.6 cm in the  transverse diameter.  Chest x-ray showed a minor opacity at the lung  bases, right greater than left, pneumonia cannot be  excluded.  No  evidence of failure seen.  CT scan of the chest rule out P protocol was  done on January 09, 2007 and showed bilateral small effusions, right  upper lobe patchy infiltrate and right hilar adenopathy.   FOLLOW-UP RECOMMENDATIONS:  1. Appointment with Dr. Artist Pais in 1 week.  2. Repeat chest x-ray in 4-6 weeks.  3. Call if problems.      Georgina Quint. Plotnikov, MD  Electronically Signed     AVP/MEDQ  D:  01/10/2007  T:  01/11/2007  Job:  [id number]   cc:   Barbette Hair. Artist Pais, DO
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
## 2011-04-08 ENCOUNTER — Other Ambulatory Visit: Payer: Self-pay | Admitting: Gynecology

## 2011-04-08 DIAGNOSIS — Z1231 Encounter for screening mammogram for malignant neoplasm of breast: Secondary | ICD-10-CM

## 2011-04-09 ENCOUNTER — Encounter: Payer: Self-pay | Admitting: Internal Medicine

## 2011-04-09 NOTE — Assessment & Plan Note (Signed)
stable overall by hx and exam, most recent lab reviewed with pt, and pt to continue medical treatment as before  Lab Results  Component Value Date   WBC 7.5 02/05/2011   HGB 13.2 02/05/2011   HCT 40.5 02/05/2011   PLT 214 02/05/2011   CHOL 158 04/03/2011   TRIG 108.0 04/03/2011   HDL 48.80 04/03/2011   LDLDIRECT 158.1 01/02/2011   ALT 18 04/03/2011   AST 22 04/03/2011   NA 140 02/05/2011   K 4.0 02/05/2011   CL 100 02/05/2011   CREATININE 0.74 02/05/2011   BUN 12 02/05/2011   CO2 30 02/05/2011   TSH 1.68 01/02/2011

## 2011-04-09 NOTE — Assessment & Plan Note (Signed)
stable overall by hx and exam, most recent lab reviewed with pt, and pt to continue medical treatment as before  Lab Results  Component Value Date   LDLCALC 88 04/03/2011

## 2011-04-09 NOTE — Progress Notes (Signed)
Subjective:    Patient ID: [name], female    DOB: Nov 16, 1936, 75 y.o.   MRN: [id number]  HPI Here to f/u; overall doing ok,  Pt denies chest pain, increased sob or doe, wheezing, orthopnea, PND, increased LE swelling, palpitations, dizziness or syncope, but has had some mild edema bilat  Pt denies new neurological symptoms such as new headache, or facial or extremity weakness or numbness   Pt denies polydipsia, polyuria, or low sugar symptoms such as weakness or confusion improved with po intake.  Pt states overall good compliance with meds, trying to follow lower chol diet, wt overall stable but little exercise however. Denies worsening depressive symptoms, suicidal ideation, or panic, though has ongoing anxiety, not increased recently.   Past Medical History  Diagnosis Date  . HYPERLIPIDEMIA 06/22/2008  . DEPRESSION 06/13/2007  . Chalazion 10/30/2007  . HYPERTENSION 10/30/2007  . GERD 06/13/2007  . DIVERTICULOSIS, COLON 06/22/2008  . DISC DISEASE, LUMBAR 06/21/2009  . SCIATICA, LEFT 12/28/2008  . OSTEOPENIA 06/13/2007  . FATIGUE 06/21/2009  . PARESTHESIA, HANDS 10/30/2007  . DVT, HX OF 06/13/2007  . Preventative health care 03/31/2011   Past Surgical History  Procedure Date  . Tubal ligation   . Knee, foot surgury   . Breast lumpectomy April 2008  . S/p right cataract  Feb. 2012    reports that she has never smoked. She does not have any smokeless tobacco history on file. She reports that she does not drink alcohol or use illicit drugs. family history includes Alzheimer's disease in her mother and sister; Cancer in her father; Hypertension in her mother; and Parkinsonism in her mother. Allergies  Allergen Reactions  . Codeine     REACTION: nausea  . Moxifloxacin   . Penicillins   . Pramipexole Dihydrochloride   . Ropinirole Hydrochloride    Current Outpatient Prescriptions on File Prior to Visit  Medication Sig Dispense Refill  . ALPRAZolam (XANAX) 0.25 MG tablet Take 0.25 mg by  mouth 2 (two) times daily.        Marland Kitchen aspirin 81 MG EC tablet Take 81 mg by mouth daily.        . clobetasol (TEMOVATE) 0.05 % cream Apply topically.        . fexofenadine (ALLEGRA) 180 MG tablet Take 180 mg by mouth daily.        Marland Kitchen gabapentin (NEURONTIN) 300 MG capsule Take 300 mg by mouth 3 (three) times daily.        . hydrochlorothiazide 25 MG tablet Take 1/2 by mouth every morning       . hydrocortisone (ANUCORT-HC) 25 MG suppository Place 25 mg rectally 2 (two) times daily.        . hydrocortisone (PROCTOZONE-HC) 2.5 % rectal cream Place rectally 2 (two) times daily.        . Lactobacillus (ACIDOPHILUS) TABS Take by mouth daily.        Marland Kitchen latanoprost (XALATAN) 0.005 % ophthalmic solution Place 1 drop into both eyes at bedtime.        . nitrofurantoin (MACRODANTIN) 100 MG capsule Take 100 mg by mouth at bedtime.        . pravastatin (PRAVACHOL) 40 MG tablet Take 40 mg by mouth daily.        . sertraline (ZOLOFT) 100 MG tablet Take 100 mg by mouth 2 (two) times daily.        . fluconazole (DIFLUCAN) 100 MG tablet 1 by mouth once daily for 3 days as needed       .  Omega-3 Fatty Acids (FISH OIL) 1000 MG CAPS Take by mouth daily.         Review of Systems All otherwise neg per pt     Objective:   Physical Exam BP 122/68  Pulse 96  Temp(Src) 98.6 F (37 C) (Oral)  Ht 5\' 5"  (1.651 m)  Wt 192 lb 6 oz (87.261 kg)  BMI 32.01 kg/m2  SpO2 97% Physical Exam  VS noted Constitutional: Pt appears well-developed and well-nourished.  HENT: Head: Normocephalic.  Right Ear: External ear normal.  Left Ear: External ear normal.  Eyes: Conjunctivae and EOM are normal. Pupils are equal, round, and reactive to light.  Neck: Normal range of motion. Neck supple.  Cardiovascular: Normal rate and regular rhythm.   Pulmonary/Chest: Effort normal and breath sounds normal.  Abd:  Soft, NT, non-distended, + BS Neurological: Pt is alert. No cranial nerve deficit.  Skin: Skin is warm. No erythema.    Psychiatric: Pt behavior is normal. Thought content normal.         Assessment & Plan:

## 2011-04-09 NOTE — Assessment & Plan Note (Signed)
stable overall by hx and exam, most recent lab reviewed with pt, and pt to continue medical treatment as before  BP Readings from Last 3 Encounters:  04/03/11 122/68  01/02/11 110/66  07/05/10 110/70

## 2011-04-13 ENCOUNTER — Other Ambulatory Visit: Payer: Self-pay | Admitting: Internal Medicine

## 2011-05-06 ENCOUNTER — Ambulatory Visit
Admission: RE | Admit: 2011-05-06 | Discharge: 2011-05-06 | Disposition: A | Discharge status: Home / Self Care | Payer: Medicare Other | Source: Ambulatory Visit | Attending: Gynecology | Admitting: Gynecology

## 2011-05-06 DIAGNOSIS — Z1231 Encounter for screening mammogram for malignant neoplasm of breast: Secondary | ICD-10-CM

## 2011-05-16 ENCOUNTER — Ambulatory Visit
Admission: RE | Admit: 2011-05-16 | Discharge: 2011-05-16 | Disposition: A | Discharge status: Home / Self Care | Payer: Medicare Other | Source: Ambulatory Visit | Attending: Neurosurgery | Admitting: Neurosurgery

## 2011-05-16 ENCOUNTER — Other Ambulatory Visit: Payer: Self-pay | Admitting: Neurosurgery

## 2011-05-16 DIAGNOSIS — M48061 Spinal stenosis, lumbar region without neurogenic claudication: Secondary | ICD-10-CM

## 2011-05-21 ENCOUNTER — Other Ambulatory Visit: Payer: Self-pay | Admitting: Internal Medicine

## 2011-06-12 ENCOUNTER — Other Ambulatory Visit: Payer: Self-pay | Admitting: Neurosurgery

## 2011-06-12 DIAGNOSIS — M79603 Pain in arm, unspecified: Secondary | ICD-10-CM

## 2011-06-12 DIAGNOSIS — M542 Cervicalgia: Secondary | ICD-10-CM

## 2011-06-12 DIAGNOSIS — M25559 Pain in unspecified hip: Secondary | ICD-10-CM

## 2011-06-17 ENCOUNTER — Ambulatory Visit
Admission: RE | Admit: 2011-06-17 | Discharge: 2011-06-17 | Disposition: A | Discharge status: Home / Self Care | Payer: Medicare Other | Source: Ambulatory Visit | Attending: Neurosurgery | Admitting: Neurosurgery

## 2011-06-17 DIAGNOSIS — M25559 Pain in unspecified hip: Secondary | ICD-10-CM

## 2011-06-17 DIAGNOSIS — M79603 Pain in arm, unspecified: Secondary | ICD-10-CM

## 2011-06-17 DIAGNOSIS — M542 Cervicalgia: Secondary | ICD-10-CM

## 2011-07-18 ENCOUNTER — Telehealth: Payer: Self-pay | Admitting: *Deleted

## 2011-07-18 NOTE — Telephone Encounter (Signed)
Daughter left VM req RF, unsure med, Please call daughter on cell [phone]

## 2011-07-19 MED ORDER — ALPRAZOLAM 0.25 MG PO TABS: 0.2500 mg | ORAL_TABLET | Freq: Two times a day (BID) | ORAL | Status: DC

## 2011-07-19 NOTE — Telephone Encounter (Signed)
Called the patient's daughter [name], the patient needs her Xanax refilled and sent to [facility]

## 2011-07-19 NOTE — Telephone Encounter (Signed)
Done hardcopy to dahlia/LIM B  

## 2011-07-19 NOTE — Telephone Encounter (Signed)
Faxed hardcopy to [facility] [employer] [phone]

## 2011-07-22 ENCOUNTER — Other Ambulatory Visit: Payer: Self-pay | Admitting: Internal Medicine

## 2011-07-26 ENCOUNTER — Encounter (HOSPITAL_COMMUNITY)
Admission: RE | Admit: 2011-07-26 | Discharge: 2011-07-26 | Disposition: A | Discharge status: Home / Self Care | Payer: Medicare Other | Source: Ambulatory Visit | Attending: Neurosurgery | Admitting: Neurosurgery

## 2011-07-26 LAB — CBC
Platelets: 199 10*3/uL (ref 150–400)
RBC: 3.85 MIL/uL — ABNORMAL LOW (ref 3.87–5.11)
RDW: 13.9 % (ref 11.5–15.5)
WBC: 6.5 10*3/uL (ref 4.0–10.5)

## 2011-07-26 LAB — COMPREHENSIVE METABOLIC PANEL
ALT: 15 U/L (ref 0–35)
AST: 18 U/L (ref 0–37)
Calcium: 9.3 mg/dL (ref 8.4–10.5)
Creatinine, Ser: 0.72 mg/dL (ref 0.50–1.10)
GFR calc Af Amer: >60 mL/min (ref >60)
Sodium: 142 mEq/L (ref 135–145)
Total Protein: 7.1 g/dL (ref 6.0–8.3)

## 2011-07-26 LAB — DIFFERENTIAL
Basophils Absolute: 0 10*3/uL (ref 0.0–0.1)
Basophils Relative: 1 % (ref 0–1)
Eosinophils Absolute: 0.2 10*3/uL (ref 0.0–0.7)
Eosinophils Relative: 3 % (ref 0–5)
Monocytes Absolute: 0.4 10*3/uL (ref 0.1–1.0)

## 2011-07-26 LAB — TYPE AND SCREEN: ABO/RH(D): AB POS

## 2011-07-26 LAB — SURGICAL PCR SCREEN: Staphylococcus aureus: POSITIVE — AB

## 2011-07-30 ENCOUNTER — Inpatient Hospital Stay (HOSPITAL_COMMUNITY): Payer: Medicare Other

## 2011-07-30 ENCOUNTER — Inpatient Hospital Stay (HOSPITAL_COMMUNITY)
Admission: RE | Admit: 2011-07-30 | Discharge: 2011-07-31 | DRG: 473 | Disposition: A | Discharge status: Home / Self Care | Payer: Medicare Other | Source: Ambulatory Visit | Attending: Neurosurgery | Admitting: Neurosurgery

## 2011-07-30 DIAGNOSIS — F3289 Other specified depressive episodes: Secondary | ICD-10-CM | POA: Diagnosis present

## 2011-07-30 DIAGNOSIS — F329 Major depressive disorder, single episode, unspecified: Secondary | ICD-10-CM | POA: Diagnosis present

## 2011-07-30 DIAGNOSIS — F411 Generalized anxiety disorder: Secondary | ICD-10-CM | POA: Diagnosis present

## 2011-07-30 DIAGNOSIS — I10 Essential (primary) hypertension: Secondary | ICD-10-CM | POA: Diagnosis present

## 2011-07-30 DIAGNOSIS — Z01812 Encounter for preprocedural laboratory examination: Secondary | ICD-10-CM

## 2011-07-30 DIAGNOSIS — M47812 Spondylosis without myelopathy or radiculopathy, cervical region: Principal | ICD-10-CM | POA: Diagnosis present

## 2011-07-30 HISTORY — PX: ANTERIOR CERVICAL DECOMP/DISCECTOMY FUSION: SHX1161

## 2011-08-01 NOTE — Op Note (Signed)
[name], [name]                 ACCOUNT NO.:  [id number]  MEDICAL RECORD NO.:  [id number]  LOCATION:  3526                         FACILITY:  [facility]  PHYSICIAN:  Kathaleen Maser. Jahki Witham, M.D.    DATE OF BIRTH:  04/13/1936  DATE OF PROCEDURE:  07/30/2011 DATE OF DISCHARGE:                              OPERATIVE REPORT   PREOPERATIVE DIAGNOSIS:  C4-5, C5-6, C6-7 spondylosis with stenosis.  POSTOPERATIVE DIAGNOSIS:  C4-5, C5-6, C6-7 spondylosis with stenosis.  PROCEDURE:  C4-5, C5-6, C6-7 anterior cervical diskectomy and fusion with allografting and plating.  SURGEON:  Kathaleen Maser. Shermon Bozzi, MD  ASSISTANT:  Reinaldo Meeker, MD  ANESTHESIA:  General endotracheal.  INDICATIONS:  [name] is a 75 year old female with history of neck and bilateral upper extremity pain right greater than left.  Workup demonstrates evidence of marked spondylosis with stenosis at C4-5, C5-6, and C6-7.  The patient presents now for three-level anterior decompression and fusion in hopes of improving her symptoms.  OPERATIVE NOTE:  The patient was taken to the operating room and placed on operating room table in supine position.  After an adequate level of anesthesia was achieved, the patient was positioned supine, neck slightly extended and held in place with halter traction.  The patient's anterior cervical region was prepped and draped sterilely.  A 10 blade was used to make a linear skin incision overlying the C5-6 level.  This was carried down sharply to the platysma.  Platysmal was divided vertically and dissection proceeded to the medial border of the sternocleidomastoid and carotid sheath.  Trachea and esophagus were mobilized and tracked towards the left.  Prevertebral fascia was stripped off anterior spinal column.  Longus colli muscle was then elevated bilaterally using electrocautery.  Deep self-retaining retractors were placed.  Intraoperative x-ray was taken and the level was confirmed.  Disk space at  C4-5, C5-6, and C6-7 were incised with a 15 blade in rectangular fashion.  Wide disk space clean-out was then achieved using pituitary rongeurs, forward and backward Karlin curettes, Kerrison rongeurs, and high-speed drill.  All elements of the disk were completely removed down to the level of the posterior annulus. Microscope was then brought into field and utilized through the remainder of the diskectomy.  The remaining aspects of the annulus and osteophytes were removed using high-speed drill down to the level of the posterior longitudinal ligament.  The posterior longitudinal ligament was elevated and resected in piecemeal fashion using Kerrison rongeurs. The underlying thecal sac was then identified.  Wide central decompression then performed by undercutting the bodies of C4 and C5. Decompression then proceeded out in each neural foramen.  Wide anterior foraminotomy was then performed along the course of the exiting C5 nerve roots bilaterally.  At this point, a very thorough diskectomy has been achieved.  There was no evidence of injury to thecal sac or nerve roots. Wound was then irrigated with antibiotic solution.  Gelfoam was placed topically for hemostasis which was found to be good.  Procedure then repeated at C5-6 and C6-7 again without complication.  Wound was then irrigated with antibiotic solution.  Cornerstone allograft wedges were then impacted into place at C4-5, C5-6 and C6-7.  All grafts were recessed approximately 1 mm from the anterior cortical margin.  A 52-mm Atlantis translational anterior cervical plate was then placed over the C4, C5, C6 and C7 levels.  This was then attached under fluoroscopic guidance using 13-mm bare-metal screws two each at all levels.  All screws were given final tightening and found to be solidly within bone. Locking screws were engaged at all levels.  Final images revealed good position of the bone grafts, hardware at proper operative  level, and normal alignment of the spine.  Wound was then irrigated with antibiotic solution.  Hemostasis was ensured with bipolar electrocautery.  A 7-mm JP was left in the prevertebral space and the wound was then closed in typical fashion.  Steri-Strips and sterile dressings were applied. There were no complications.  The patient tolerated the procedure well, and she returns to the recovery room postoperatively.          ______________________________ Kathaleen Maser [name], M.D.     HAP/MEDQ  D:  07/30/2011  T:  07/31/2011  Job:  [id number]  Electronically Signed by Julio Sicks M.D. on 08/01/2011 09:34:42 AM

## 2011-09-26 ENCOUNTER — Other Ambulatory Visit: Payer: Self-pay | Admitting: Neurosurgery

## 2011-09-26 ENCOUNTER — Ambulatory Visit
Admission: RE | Admit: 2011-09-26 | Discharge: 2011-09-26 | Disposition: A | Discharge status: Home / Self Care | Payer: Medicare Other | Source: Ambulatory Visit | Attending: Neurosurgery | Admitting: Neurosurgery

## 2011-09-26 DIAGNOSIS — M542 Cervicalgia: Secondary | ICD-10-CM

## 2011-10-17 ENCOUNTER — Other Ambulatory Visit: Payer: Self-pay | Admitting: Internal Medicine

## 2011-12-03 ENCOUNTER — Ambulatory Visit: Payer: Self-pay | Admitting: Ophthalmology

## 2011-12-03 LAB — POTASSIUM: Potassium: 3.6 mmol/L (ref 3.5–5.1)

## 2011-12-09 ENCOUNTER — Other Ambulatory Visit: Payer: Self-pay | Admitting: Internal Medicine

## 2011-12-09 NOTE — Telephone Encounter (Signed)
Faxed hardcopy to pharmacy. 

## 2011-12-17 ENCOUNTER — Ambulatory Visit: Payer: Self-pay | Admitting: Ophthalmology

## 2011-12-25 ENCOUNTER — Other Ambulatory Visit: Payer: Self-pay | Admitting: Internal Medicine

## 2012-01-14 ENCOUNTER — Other Ambulatory Visit: Payer: Self-pay | Admitting: Internal Medicine

## 2012-01-14 MED ORDER — HYDROCORTISONE ACETATE 25 MG RE SUPP: 25.0000 mg | Freq: Two times a day (BID) | RECTAL | Status: DC

## 2012-01-14 NOTE — Telephone Encounter (Signed)
anusol HC done escript  Xanax Done hardcopy to robin

## 2012-01-15 NOTE — Telephone Encounter (Signed)
Faxed hardcopy to pharmacy. 

## 2012-02-14 ENCOUNTER — Other Ambulatory Visit: Payer: Self-pay | Admitting: Internal Medicine

## 2012-02-17 ENCOUNTER — Other Ambulatory Visit: Payer: Self-pay | Admitting: Internal Medicine

## 2012-03-16 ENCOUNTER — Other Ambulatory Visit: Payer: Self-pay | Admitting: Internal Medicine

## 2012-03-16 NOTE — Telephone Encounter (Signed)
Faxed hardcopy to pharmacy. 

## 2012-03-16 NOTE — Telephone Encounter (Signed)
Done hardcopy to robin  

## 2012-04-02 ENCOUNTER — Ambulatory Visit: Payer: Medicare Other | Admitting: Internal Medicine

## 2012-04-02 ENCOUNTER — Ambulatory Visit (INDEPENDENT_AMBULATORY_CARE_PROVIDER_SITE_OTHER): Payer: Medicare Other | Admitting: Internal Medicine

## 2012-04-02 ENCOUNTER — Encounter: Payer: Self-pay | Admitting: Internal Medicine

## 2012-04-02 VITALS — BP 116/82 | HR 72 | Temp 98.0°F | Ht 65.0 in | Wt 168.2 lb

## 2012-04-02 DIAGNOSIS — I10 Essential (primary) hypertension: Secondary | ICD-10-CM

## 2012-04-02 DIAGNOSIS — R7302 Impaired glucose tolerance (oral): Secondary | ICD-10-CM | POA: Insufficient documentation

## 2012-04-02 DIAGNOSIS — E785 Hyperlipidemia, unspecified: Secondary | ICD-10-CM

## 2012-04-02 DIAGNOSIS — F329 Major depressive disorder, single episode, unspecified: Secondary | ICD-10-CM

## 2012-04-02 DIAGNOSIS — R7309 Other abnormal glucose: Secondary | ICD-10-CM

## 2012-04-02 MED ORDER — FLUCONAZOLE 100 MG PO TABS: ORAL_TABLET | ORAL | Status: DC

## 2012-04-02 NOTE — Assessment & Plan Note (Signed)
Overall stable and nonsuicidal, but will cont meds as is, refer for counseling

## 2012-04-02 NOTE — Progress Notes (Signed)
Subjective:    Patient ID: [name], female    DOB: 05/06/1936, 76 y.o.   MRN: [id number]  HPI  Here to f/u; overall doing ok, did get past the c-spine surgury sept 2012, and lumbar surgury mar 2012 without complications and overall pain very much improved without new complaints today regarding this;  Did unfort stumble and fell yesterday to left side with some soreness/achy to left side, but overall minor.  Needs diflucan refill for recurrent mild vaginal candida symptoms ongoing for over a yr.  Pt denies chest pain, increased sob or doe, wheezing, orthopnea, PND, increased LE swelling, palpitations, dizziness or syncope.  Pt denies new neurological symptoms such as new headache, or facial or extremity weakness or numbness   Pt denies polydipsia, polyuria.  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  Would like to be referred for counseling.   Pt denies fever, wt loss, night sweats, loss of appetite, or other constitutional symptoms Past Medical History  Diagnosis Date  . HYPERLIPIDEMIA 06/22/2008  . DEPRESSION 06/13/2007  . Chalazion 10/30/2007  . HYPERTENSION 10/30/2007  . GERD 06/13/2007  . DIVERTICULOSIS, COLON 06/22/2008  . DISC DISEASE, LUMBAR 06/21/2009  . SCIATICA, LEFT 12/28/2008  . OSTEOPENIA 06/13/2007  . FATIGUE 06/21/2009  . PARESTHESIA, HANDS 10/30/2007  . DVT, HX OF 06/13/2007  . Preventative health care 03/31/2011  . Impaired glucose tolerance 04/02/2012   Past Surgical History  Procedure Date  . Tubal ligation   . Knee, foot surgury   . Breast lumpectomy April 2008  . S/p right cataract  Feb. 2012    reports that she has never smoked. She does not have any smokeless tobacco history on file.  She reports that she does not drink alcohol or use illicit drugs. family history includes Alzheimer's disease in her mother and sister; Cancer in her father; Hypertension in her mother; and Parkinsonism in her mother. Allergies  Allergen Reactions  . Codeine     REACTION: nausea  . Moxifloxacin   . Penicillins   . Pramipexole Dihydrochloride   . Ropinirole Hydrochloride    Current Outpatient Prescriptions on File Prior to Visit  Medication Sig Dispense Refill  . ALPRAZolam (XANAX) 0.25 MG tablet TAKE 1 TABLET BY MOUTH TWICE DAILY  60 tablet  0  . aspirin 81 MG EC tablet Take 81 mg by mouth daily.        . clobetasol (TEMOVATE) 0.05 % cream Apply topically.        . fexofenadine (ALLEGRA) 180 MG tablet Take 180 mg by mouth daily.        Marland Kitchen gabapentin (NEURONTIN) 300 MG capsule TAKE 2 CAPSULES BY MOUTH THREE TIMES DAILY  540 capsule  1  . hydrochlorothiazide 25 MG tablet TAKE  1/2 TABLET BY MOUTH DAILY IN THE MORNING  100 tablet  3  . hydrocortisone (ANUCORT-HC) 25 MG suppository Place 1 suppository (25 mg total) rectally 2 (two) times daily.  12 suppository  0  . Lactobacillus (ACIDOPHILUS) TABS Take by mouth daily.        Marland Kitchen latanoprost (XALATAN) 0.005 % ophthalmic solution Place 1 drop into both eyes at bedtime.        . nitrofurantoin (MACRODANTIN) 100  MG capsule Take 100 mg by mouth at bedtime.        . Omega-3 Fatty Acids (FISH OIL) 1000 MG CAPS Take by mouth daily.        . pravastatin (PRAVACHOL) 40 MG tablet TAKE 1 TABLET BY MOUTH DAILY  90 tablet  2  . PROCTOZONE-HC 2.5 % rectal cream USE AS DIRECTED TWICE DAILY AS NEEDED  30 g  0  . sertraline (ZOLOFT) 100 MG tablet Take 100 mg by mouth 2 (two) times daily.         ,Review of Systems Review of Systems  Constitutional: Negative for diaphoresis and unexpected weight change.  HENT: Negative for  tinnitus.   Eyes: Negative for photophobia and visual disturbance.  Respiratory: Negative for choking and stridor.   Gastrointestinal:  Negative for vomiting and blood in stool.  Genitourinary: Negative for hematuria and decreased urine volume.  Musculoskeletal: Negative for gait problem.  Skin: Negative for color change and wound.  Neurological: Negative for tremors and numbness.  Psychiatric/Behavioral: Negative for decreased concentration. The patient is not hyperactive.       Objective:   Physical Exam BP 116/82  Pulse 72  Temp(Src) 98 F (36.7 C) (Oral)  Ht 5\' 5"  (1.651 m)  Wt 168 lb 4 oz (76.318 kg)  BMI 28.00 kg/m2  SpO2 94% Physical Exam  VS noted,  Constitutional: Pt appears well-developed and well-nourished.  HENT: Head: Normocephalic.  Right Ear: External ear normal.  Left Ear: External ear normal.  Eyes: Conjunctivae and EOM are normal. Pupils are equal, round, and reactive to light.  Neck: Normal range of motion. Neck supple.  Cardiovascular: Normal rate and regular rhythm.   Pulmonary/Chest: Effort normal and breath sounds normal.  Abd:  Soft, NT, non-distended, + BS Neurological: Pt is alert. Not confused Skin: Skin is warm. No erythema. No rash Psychiatric: Pt behavior is normal. Thought content normal. but fatigued with mild psychomotor retardation    Assessment & Plan:

## 2012-04-02 NOTE — Assessment & Plan Note (Signed)
stable overall by hx and exam, most recent data reviewed with pt, and pt to continue medical treatment as before Lab Results  Component Value Date   WBC 6.5 07/26/2011   HGB 12.1 07/26/2011   HCT 36.4 07/26/2011   PLT 199 07/26/2011   GLUCOSE 119* 07/26/2011   CHOL 158 04/03/2011   TRIG 108.0 04/03/2011   HDL 48.80 04/03/2011   LDLDIRECT 158.1 01/02/2011   LDLCALC 88 04/03/2011   ALT 15 07/26/2011   AST 18 07/26/2011   NA 142 07/26/2011   K 3.6 07/26/2011   CL 105 07/26/2011   CREATININE 0.72 07/26/2011   BUN 19 07/26/2011   CO2 28 07/26/2011   TSH 1.68 01/02/2011

## 2012-04-02 NOTE — Assessment & Plan Note (Signed)
stable overall by hx and exam, most recent data reviewed with pt, and pt to continue medical treatment as before BP Readings from Last 3 Encounters:  04/02/12 116/82  04/03/11 122/68  01/02/11 110/66

## 2012-04-02 NOTE — Assessment & Plan Note (Signed)
stable overall by hx and exam, most recent data reviewed with pt, and pt to continue medical treatment as before Lab Results  Component Value Date   LDLCALC 88 04/03/2011

## 2012-04-02 NOTE — Patient Instructions (Signed)
Continue all other medications as before Your diflucan was refilled Please have the pharmacy call with any refills you may need. You will be contacted regarding the referral for: psychology  No need for blood work today, but will need next visit Please return in 6 months, or sooner if needed

## 2012-04-08 ENCOUNTER — Other Ambulatory Visit: Payer: Self-pay | Admitting: Internal Medicine

## 2012-04-27 ENCOUNTER — Other Ambulatory Visit: Payer: Self-pay | Admitting: Gynecology

## 2012-04-27 DIAGNOSIS — Z1231 Encounter for screening mammogram for malignant neoplasm of breast: Secondary | ICD-10-CM

## 2012-05-02 ENCOUNTER — Other Ambulatory Visit: Payer: Self-pay | Admitting: Internal Medicine

## 2012-05-06 ENCOUNTER — Other Ambulatory Visit: Payer: Self-pay | Admitting: Gynecology

## 2012-05-13 ENCOUNTER — Ambulatory Visit
Admission: RE | Admit: 2012-05-13 | Discharge: 2012-05-13 | Disposition: A | Discharge status: Home / Self Care | Payer: Medicare Other | Source: Ambulatory Visit | Attending: Gynecology | Admitting: Gynecology

## 2012-05-13 DIAGNOSIS — Z1231 Encounter for screening mammogram for malignant neoplasm of breast: Secondary | ICD-10-CM

## 2012-05-20 ENCOUNTER — Other Ambulatory Visit: Payer: Self-pay | Admitting: Internal Medicine

## 2012-05-22 NOTE — Telephone Encounter (Signed)
Rx faxed to [facility] in [place].

## 2012-05-22 NOTE — Telephone Encounter (Signed)
Last written 03/16/2012 #60 with 0 refills-please advise in JWJ's absence-thanks.

## 2012-06-22 ENCOUNTER — Other Ambulatory Visit: Payer: Self-pay

## 2012-06-22 MED ORDER — HYDROCORTISONE 2.5 % RE CREA: TOPICAL_CREAM | Freq: Two times a day (BID) | RECTAL | Status: DC

## 2012-07-06 ENCOUNTER — Other Ambulatory Visit: Payer: Self-pay

## 2012-07-06 MED ORDER — HYDROCORTISONE ACETATE 25 MG RE SUPP: 25.0000 mg | Freq: Two times a day (BID) | RECTAL | Status: DC

## 2012-08-03 ENCOUNTER — Other Ambulatory Visit: Payer: Self-pay | Admitting: Endocrinology

## 2012-08-03 NOTE — Telephone Encounter (Signed)
Last written 05/20/2012 #60 with 0 refills-please advise.

## 2012-08-03 NOTE — Telephone Encounter (Signed)
Done hardcopy to robin  

## 2012-08-03 NOTE — Telephone Encounter (Signed)
Faxed hardcopy to pharmacy. 

## 2012-08-13 ENCOUNTER — Other Ambulatory Visit: Payer: Self-pay

## 2012-08-13 MED ORDER — GABAPENTIN 300 MG PO CAPS: ORAL_CAPSULE | ORAL | Status: DC

## 2012-08-13 NOTE — Telephone Encounter (Signed)
done erx 

## 2012-09-17 ENCOUNTER — Other Ambulatory Visit: Payer: Self-pay

## 2012-09-17 MED ORDER — PRAVASTATIN SODIUM 40 MG PO TABS: 40.0000 mg | ORAL_TABLET | Freq: Every day | ORAL | Status: DC

## 2012-10-08 ENCOUNTER — Ambulatory Visit: Payer: Medicare Other | Admitting: Internal Medicine

## 2012-11-02 ENCOUNTER — Encounter: Payer: Self-pay | Admitting: Internal Medicine

## 2012-11-02 ENCOUNTER — Other Ambulatory Visit (INDEPENDENT_AMBULATORY_CARE_PROVIDER_SITE_OTHER): Payer: Medicare Other

## 2012-11-02 ENCOUNTER — Ambulatory Visit (INDEPENDENT_AMBULATORY_CARE_PROVIDER_SITE_OTHER): Payer: Medicare Other | Admitting: Internal Medicine

## 2012-11-02 ENCOUNTER — Telehealth: Payer: Self-pay | Admitting: Internal Medicine

## 2012-11-02 VITALS — BP 132/74 | HR 64 | Temp 97.4°F | Ht 66.0 in | Wt 175.1 lb

## 2012-11-02 DIAGNOSIS — F419 Anxiety disorder, unspecified: Secondary | ICD-10-CM

## 2012-11-02 DIAGNOSIS — E785 Hyperlipidemia, unspecified: Secondary | ICD-10-CM

## 2012-11-02 DIAGNOSIS — R7302 Impaired glucose tolerance (oral): Secondary | ICD-10-CM

## 2012-11-02 DIAGNOSIS — M549 Dorsalgia, unspecified: Secondary | ICD-10-CM

## 2012-11-02 DIAGNOSIS — R7309 Other abnormal glucose: Secondary | ICD-10-CM

## 2012-11-02 DIAGNOSIS — F329 Major depressive disorder, single episode, unspecified: Secondary | ICD-10-CM

## 2012-11-02 DIAGNOSIS — I10 Essential (primary) hypertension: Secondary | ICD-10-CM

## 2012-11-02 LAB — BASIC METABOLIC PANEL
CO2: 32 mEq/L (ref 19–32)
Chloride: 101 mEq/L (ref 96–112)
Creatinine, Ser: 0.7 mg/dL (ref 0.4–1.2)
Potassium: 5.8 mEq/L — ABNORMAL HIGH (ref 3.5–5.1)
Sodium: 141 mEq/L (ref 135–145)

## 2012-11-02 LAB — CBC WITH DIFFERENTIAL/PLATELET
Basophils Absolute: 0 10*3/uL (ref 0.0–0.1)
Basophils Relative: 0.3 % (ref 0.0–3.0)
Eosinophils Absolute: 0.2 10*3/uL (ref 0.0–0.7)
Hemoglobin: 12.8 g/dL (ref 12.0–15.0)
MCHC: 33.3 g/dL (ref 30.0–36.0)
MCV: 95.4 fl (ref 78.0–100.0)
Monocytes Absolute: 0.4 10*3/uL (ref 0.1–1.0)
Neutro Abs: 4.5 10*3/uL (ref 1.4–7.7)
Neutrophils Relative %: 63.8 % (ref 43.0–77.0)
RBC: 4.01 Mil/uL (ref 3.87–5.11)
RDW: 13.1 % (ref 11.5–14.6)

## 2012-11-02 LAB — HEPATIC FUNCTION PANEL
ALT: 14 U/L (ref 0–35)
Alkaline Phosphatase: 58 U/L (ref 39–117)
Bilirubin, Direct: 0.1 mg/dL (ref 0.0–0.3)
Total Bilirubin: 0.7 mg/dL (ref 0.3–1.2)
Total Protein: 8 g/dL (ref 6.0–8.3)

## 2012-11-02 LAB — URINALYSIS, ROUTINE W REFLEX MICROSCOPIC
Bilirubin Urine: NEGATIVE
Hgb urine dipstick: NEGATIVE
Total Protein, Urine: NEGATIVE
Urine Glucose: NEGATIVE
Urobilinogen, UA: 0.2 (ref 0.0–1.0)

## 2012-11-02 LAB — LIPID PANEL
LDL Cholesterol: 94 mg/dL (ref 0–99)
Total CHOL/HDL Ratio: 3
Triglycerides: 59 mg/dL (ref 0.0–149.0)

## 2012-11-02 MED ORDER — SERTRALINE HCL 100 MG PO TABS: ORAL_TABLET | ORAL | Status: DC

## 2012-11-02 MED ORDER — CYCLOBENZAPRINE HCL 5 MG PO TABS: 5.0000 mg | ORAL_TABLET | Freq: Three times a day (TID) | ORAL | Status: DC | PRN

## 2012-11-02 MED ORDER — ALPRAZOLAM 0.25 MG PO TABS: 0.2500 mg | ORAL_TABLET | Freq: Two times a day (BID) | ORAL | Status: DC | PRN

## 2012-11-02 MED ORDER — HYDROCHLOROTHIAZIDE 25 MG PO TABS: ORAL_TABLET | ORAL | Status: DC

## 2012-11-02 MED ORDER — HYDROCORTISONE 2.5 % RE CREA: TOPICAL_CREAM | Freq: Two times a day (BID) | RECTAL | Status: DC

## 2012-11-02 MED ORDER — GABAPENTIN 300 MG PO CAPS: ORAL_CAPSULE | ORAL | Status: DC

## 2012-11-02 NOTE — Progress Notes (Signed)
Subjective:    Patient ID: [name], female    DOB: [birth year], 76 y.o.   MRN: [id number]  HPI  Here for  f/u;  Overall doing ok;  Pt denies CP, worsening SOB, DOE, wheezing, orthopnea, PND, worsening LE edema, palpitations, dizziness or syncope.  Pt denies neurological change such as new Headache, facial or extremity weakness.  Pt denies polydipsia, polyuria, or low sugar symptoms. Pt states overall good compliance with treatment and medications, good tolerability, and trying to follow lower cholesterol diet.  Pt denies worsening depressive symptoms, suicidal ideation or panic. No fever, wt loss, night sweats, loss of appetite, or other constitutional symptoms.  Pt states good ability with ADL's, low fall risk, home safety reviewed and adequate, no significant changes in hearing or vision, and occasionally active with exercise.  Does still have persistent recurring pain to neck, no better with PT after seeing Dr Poole/NS about 2 mo ago.  Wears collar at night.  Pt denies bowel or bladder change, fever, wt loss,  worsening LE pain/numbness/weakness, gait change or falls. Also has mild recurring hand arthritis.  Has not tried muscle relaxer, and ibuprofen not helping. Past Medical History  Diagnosis Date  . HYPERLIPIDEMIA 06/22/2008  . DEPRESSION 06/13/2007  . Chalazion 10/30/2007  . HYPERTENSION 10/30/2007  . GERD 06/13/2007  . DIVERTICULOSIS, COLON 06/22/2008  . DISC DISEASE, LUMBAR 06/21/2009  . SCIATICA, LEFT 12/28/2008  . OSTEOPENIA 06/13/2007  . FATIGUE 06/21/2009  . PARESTHESIA, HANDS 10/30/2007  . DVT, HX OF 06/13/2007  . Preventative health care 03/31/2011  . Impaired glucose tolerance 04/02/2012   Past Surgical History  Procedure Date  . Tubal ligation   . Knee, foot surgury   . Breast lumpectomy April 2008  . S/p right cataract  Feb. 2012    reports that she has never smoked. She does not have any smokeless tobacco history on file. She reports that she does not drink alcohol or use  illicit drugs. family history includes Alzheimer's disease in her mother and sister; Cancer in her father; Hypertension in her mother; and Parkinsonism in her mother. Allergies  Allergen Reactions  . Codeine     REACTION: nausea  . Moxifloxacin   . Penicillins   . Pramipexole Dihydrochloride   . Ropinirole Hydrochloride    Current Outpatient Prescriptions on File Prior to Visit  Medication Sig Dispense Refill  . aspirin 81 MG EC tablet Take 81 mg by mouth daily.        . clobetasol (TEMOVATE) 0.05 % cream Apply topically.        . fexofenadine (ALLEGRA) 180 MG tablet Take 180 mg by mouth daily.        . fluconazole (DIFLUCAN) 100 MG tablet 1 by mouth once daily for 3 days as needed  12 tablet  2  . gabapentin (NEURONTIN) 300 MG capsule TAKE 2 CAPSULES BY MOUTH THREE TIMES DAILY  540 capsule  1  . hydrochlorothiazide (HYDRODIURIL) 25 MG tablet TAKE  1/2 TABLET BY MOUTH DAILY IN THE MORNING  100 tablet  3  . hydrocortisone (ANUCORT-HC) 25 MG suppository Place 1 suppository (25 mg total) rectally 2 (two) times daily.  12 suppository  1  . Lactobacillus (ACIDOPHILUS) TABS Take by mouth daily.        Marland Kitchen latanoprost (XALATAN) 0.005 % ophthalmic solution Place 1 drop into both eyes at bedtime.        . nitrofurantoin (MACRODANTIN) 100 MG capsule Take 100 mg by mouth at bedtime.        Marland Kitchen  Omega-3 Fatty Acids (FISH OIL) 1000 MG CAPS Take by mouth daily.        . pravastatin (PRAVACHOL) 40 MG tablet Take 1 tablet (40 mg total) by mouth daily.  90 tablet  2  . sertraline (ZOLOFT) 100 MG tablet TAKE 2 TABLETS BY MOUTH EVERY DAY  180 tablet  3   Review of Systems  Constitutional: Negative for diaphoresis and unexpected weight change.  HENT: Negative for tinnitus.   Eyes: Negative for photophobia and visual disturbance.  Respiratory: Negative for choking and stridor.   Gastrointestinal: Negative for vomiting and blood in stool.  Genitourinary: Negative for hematuria and decreased urine volume.   Musculoskeletal: Negative for gait problem.  Skin: Negative for color change and wound.  Neurological: Negative for tremors and numbness.  Psychiatric/Behavioral: Negative for decreased concentration. The patient is not hyperactive.       Objective:   Physical Exam BP 132/74  Pulse 64  Temp 97.4 F (36.3 C) (Oral)  Ht 5\' 6"  (1.676 m)  Wt 175 lb 2 oz (79.436 kg)  BMI 28.27 kg/m2  SpO2 95% Physical Exam  VS noted Constitutional: Pt appears well-developed and well-nourished.  HENT: Head: Normocephalic.  Right Ear: External ear normal.  Left Ear: External ear normal.  Eyes: Conjunctivae and EOM are normal. Pupils are equal, round, and reactive to light.  Neck: Normal range of motion. Neck supple.  Cardiovascular: Normal rate and regular rhythm.   Pulmonary/Chest: Effort normal and breath sounds normal.  Abd:  Soft, NT, non-distended, + BS Neurological: Pt is alert. Not confused, motor/gait intact  Skin: Skin is warm. No erythema.  Psychiatric: Pt behavior is normal. Thought content normal.  Spine nontender    Assessment & Plan:

## 2012-11-02 NOTE — Telephone Encounter (Signed)
Message copied by Corwin Levins on Mon Nov 02, 2012 12:46 PM ------      Message from: Scharlene Gloss B      Created: Mon Nov 02, 2012 11:29 AM       The patient was referred at her last OV to see a psy. ,but never went.  She would like to talk to someone now please refer back.

## 2012-11-02 NOTE — Patient Instructions (Addendum)
Take all new medications as prescribed - the muscle relaxer as needed Continue all other medications as before Your refills were done as requested today Please have the pharmacy call with any other refills you may need. You are otherwise up to date with prevention measures Please go to LAB in the Basement for the blood and/or urine tests to be done today You will be contacted by phone if any changes need to be made immediately.  Otherwise, you will receive a letter about your results with an explanation, but please check with MyChart first. Thank you for enrolling in MyChart. Please follow the instructions below to securely access your online medical record. MyChart allows you to send messages to your doctor, view your test results, renew your prescriptions, schedule appointments, and more. Please return in 1 year for your yearly visit, or sooner if needed

## 2012-11-02 NOTE — Telephone Encounter (Signed)
Ok for referral - done per emr 

## 2012-11-07 ENCOUNTER — Encounter: Payer: Self-pay | Admitting: Internal Medicine

## 2012-11-07 DIAGNOSIS — M549 Dorsalgia, unspecified: Secondary | ICD-10-CM | POA: Insufficient documentation

## 2012-11-07 NOTE — Assessment & Plan Note (Signed)
stable overall by hx and exam, most recent data reviewed with pt, and pt to continue medical treatment as before Lab Results  Component Value Date   LDLCALC 94 11/02/2012

## 2012-11-07 NOTE — Assessment & Plan Note (Signed)
Asympt, stable overall by hx and exam, most recent data reviewed with pt, and pt to continue medical treatment as before Lab Results  Component Value Date   HGBA1C 5.7 11/02/2012

## 2012-11-07 NOTE — Assessment & Plan Note (Signed)
stable overall by hx and exam, most recent data reviewed with pt, and pt to continue medical treatment as before BP Readings from Last 3 Encounters:  11/02/12 132/74  04/02/12 116/82  04/03/11 122/68

## 2012-11-07 NOTE — Assessment & Plan Note (Signed)
C/w prob msk strain, for flexeril prn,  to f/u any worsening symptoms or concerns

## 2013-01-22 ENCOUNTER — Other Ambulatory Visit: Payer: Self-pay | Admitting: Internal Medicine

## 2013-01-25 NOTE — Telephone Encounter (Signed)
Done hardcopy to robin  

## 2013-01-25 NOTE — Telephone Encounter (Signed)
Faxed hardcopy to pharmacy. 

## 2013-03-22 ENCOUNTER — Other Ambulatory Visit: Payer: Self-pay | Admitting: Internal Medicine

## 2013-07-03 ENCOUNTER — Other Ambulatory Visit: Payer: Self-pay | Admitting: Internal Medicine

## 2013-07-05 NOTE — Telephone Encounter (Signed)
Done erx 

## 2013-07-26 ENCOUNTER — Other Ambulatory Visit: Payer: Self-pay | Admitting: Internal Medicine

## 2013-07-26 NOTE — Telephone Encounter (Signed)
Ok to refill? Last OV 12.16.13 Last filled 3.7.14

## 2013-07-27 NOTE — Telephone Encounter (Signed)
Done hardcopy to robin  

## 2013-07-27 NOTE — Telephone Encounter (Signed)
Faxed hardcopy to [facility] [place] 

## 2013-08-02 LAB — HM MAMMOGRAPHY

## 2013-08-17 ENCOUNTER — Other Ambulatory Visit: Payer: Self-pay

## 2013-08-17 DIAGNOSIS — Z1231 Encounter for screening mammogram for malignant neoplasm of breast: Secondary | ICD-10-CM

## 2013-08-23 ENCOUNTER — Ambulatory Visit
Admission: RE | Admit: 2013-08-23 | Discharge: 2013-08-23 | Disposition: A | Discharge status: Home / Self Care | Payer: 59 | Source: Ambulatory Visit

## 2013-08-23 DIAGNOSIS — Z1231 Encounter for screening mammogram for malignant neoplasm of breast: Secondary | ICD-10-CM

## 2013-09-11 ENCOUNTER — Other Ambulatory Visit: Payer: Self-pay | Admitting: Internal Medicine

## 2013-09-23 ENCOUNTER — Other Ambulatory Visit: Payer: Self-pay

## 2013-09-30 ENCOUNTER — Other Ambulatory Visit: Payer: Self-pay | Admitting: Internal Medicine

## 2013-11-04 ENCOUNTER — Encounter: Payer: Self-pay | Admitting: Internal Medicine

## 2013-11-04 ENCOUNTER — Ambulatory Visit (INDEPENDENT_AMBULATORY_CARE_PROVIDER_SITE_OTHER): Payer: 59 | Admitting: Internal Medicine

## 2013-11-04 ENCOUNTER — Encounter: Payer: Self-pay | Admitting: Family Medicine

## 2013-11-04 ENCOUNTER — Ambulatory Visit (INDEPENDENT_AMBULATORY_CARE_PROVIDER_SITE_OTHER)
Admission: RE | Admit: 2013-11-04 | Discharge: 2013-11-04 | Disposition: A | Discharge status: Home / Self Care | Payer: 59 | Source: Ambulatory Visit | Attending: Internal Medicine | Admitting: Internal Medicine

## 2013-11-04 ENCOUNTER — Other Ambulatory Visit (INDEPENDENT_AMBULATORY_CARE_PROVIDER_SITE_OTHER): Payer: 59

## 2013-11-04 ENCOUNTER — Ambulatory Visit (INDEPENDENT_AMBULATORY_CARE_PROVIDER_SITE_OTHER): Payer: 59 | Admitting: Family Medicine

## 2013-11-04 VITALS — BP 122/82 | HR 66 | Temp 98.0°F | Ht 65.0 in | Wt 181.1 lb

## 2013-11-04 VITALS — BP 122/82 | HR 66 | Temp 98.0°F | Ht 65.0 in | Wt 181.0 lb

## 2013-11-04 DIAGNOSIS — R7309 Other abnormal glucose: Secondary | ICD-10-CM

## 2013-11-04 DIAGNOSIS — F4321 Adjustment disorder with depressed mood: Secondary | ICD-10-CM

## 2013-11-04 DIAGNOSIS — M25559 Pain in unspecified hip: Secondary | ICD-10-CM

## 2013-11-04 DIAGNOSIS — Z0000 Encounter for general adult medical examination without abnormal findings: Secondary | ICD-10-CM

## 2013-11-04 DIAGNOSIS — M25552 Pain in left hip: Secondary | ICD-10-CM

## 2013-11-04 DIAGNOSIS — R7302 Impaired glucose tolerance (oral): Secondary | ICD-10-CM

## 2013-11-04 DIAGNOSIS — Z136 Encounter for screening for cardiovascular disorders: Secondary | ICD-10-CM

## 2013-11-04 DIAGNOSIS — E785 Hyperlipidemia, unspecified: Secondary | ICD-10-CM

## 2013-11-04 DIAGNOSIS — Z23 Encounter for immunization: Secondary | ICD-10-CM

## 2013-11-04 LAB — HEPATIC FUNCTION PANEL
ALT: 20 U/L (ref 0–35)
Total Protein: 7.9 g/dL (ref 6.0–8.3)

## 2013-11-04 LAB — TSH: TSH: 2.8 u[IU]/mL (ref 0.35–5.50)

## 2013-11-04 LAB — CBC WITH DIFFERENTIAL/PLATELET
Basophils Absolute: 0 10*3/uL (ref 0.0–0.1)
Basophils Relative: 0.4 % (ref 0.0–3.0)
Eosinophils Relative: 2.7 % (ref 0.0–5.0)
Lymphocytes Relative: 21.3 % (ref 12.0–46.0)
MCV: 92.8 fl (ref 78.0–100.0)
Monocytes Absolute: 0.5 10*3/uL (ref 0.1–1.0)
Monocytes Relative: 5.8 % (ref 3.0–12.0)
Neutrophils Relative %: 69.8 % (ref 43.0–77.0)
Platelets: 221 10*3/uL (ref 150.0–400.0)
RDW: 13.7 % (ref 11.5–14.6)
WBC: 8.2 10*3/uL (ref 4.5–10.5)

## 2013-11-04 LAB — URINALYSIS, ROUTINE W REFLEX MICROSCOPIC
Bilirubin Urine: NEGATIVE
Leukocytes, UA: NEGATIVE
Nitrite: NEGATIVE
Specific Gravity, Urine: 1.02 (ref 1.000–1.030)
Total Protein, Urine: NEGATIVE
Urobilinogen, UA: 0.2 (ref 0.0–1.0)
pH: 6 (ref 5.0–8.0)

## 2013-11-04 LAB — LIPID PANEL
Cholesterol: 185 mg/dL (ref 0–200)
HDL: 51.7 mg/dL (ref >39.00)
LDL Cholesterol: 119 mg/dL — ABNORMAL HIGH (ref 0–99)
Triglycerides: 70 mg/dL (ref 0.0–149.0)

## 2013-11-04 LAB — BASIC METABOLIC PANEL
BUN: 14 mg/dL (ref 6–23)
CO2: 30 mEq/L (ref 19–32)
Calcium: 9.2 mg/dL (ref 8.4–10.5)
Chloride: 103 mEq/L (ref 96–112)
Creatinine, Ser: 0.7 mg/dL (ref 0.4–1.2)

## 2013-11-04 LAB — HEMOGLOBIN A1C: Hgb A1c MFr Bld: 5.6 % (ref 4.6–6.5)

## 2013-11-04 MED ORDER — CYCLOBENZAPRINE HCL 5 MG PO TABS: 5.0000 mg | ORAL_TABLET | Freq: Three times a day (TID) | ORAL | Status: DC | PRN

## 2013-11-04 MED ORDER — CLOBETASOL PROPIONATE 0.05 % EX CREA: TOPICAL_CREAM | Freq: Two times a day (BID) | CUTANEOUS | Status: DC

## 2013-11-04 MED ORDER — PRAVASTATIN SODIUM 40 MG PO TABS: 40.0000 mg | ORAL_TABLET | Freq: Every day | ORAL | Status: DC

## 2013-11-04 MED ORDER — GABAPENTIN 300 MG PO CAPS: ORAL_CAPSULE | ORAL | Status: DC

## 2013-11-04 MED ORDER — SERTRALINE HCL 100 MG PO TABS: ORAL_TABLET | ORAL | Status: DC

## 2013-11-04 MED ORDER — HYDROCHLOROTHIAZIDE 25 MG PO TABS: ORAL_TABLET | ORAL | Status: DC

## 2013-11-04 MED ORDER — FLUCONAZOLE 100 MG PO TABS: ORAL_TABLET | ORAL | Status: DC

## 2013-11-04 MED ORDER — ALPRAZOLAM 0.25 MG PO TABS: ORAL_TABLET | ORAL | Status: DC

## 2013-11-04 NOTE — Progress Notes (Signed)
Pre-visit discussion using our clinic review tool. No additional management support is needed unless otherwise documented below in the visit note.  

## 2013-11-04 NOTE — Progress Notes (Signed)
  I'm seeing this patient by the request  of:  Oliver Barre, MD   CC: Left groin pain  HPI: Patient is a very pleasant 77 year old recently widowed female who is coming in with left groin pain for multiple months duration. Patient does have a past medical history of back pain and has a golf for L5 lumbar fusion previously. Patient states that she does have back pain but this seems to be separate issue. Patient states it hurts more with ambulation and any activity. States that standing for long amount of time can give her some pain as well. Discuss it is a dull aching sensation that can be radiating down the inside part of her leg. Denies any numbness or tingling and denies any bowel or bladder incontinence. Patient also denies any fevers or chills. Patient rates the severity of 8/10 and seems to come and go and worse with cold weather. Patient denies any true injury to the area. He should has not tried any over-the-counter medications at this time.   Past medical, surgical, family and social history reviewed. Medications reviewed all in the electronic medical record.   Review of Systems: No headache, visual changes, nausea, vomiting, diarrhea, constipation, dizziness, abdominal pain, skin rash, fevers, chills, night sweats, weight loss, swollen lymph nodes, body aches, joint swelling, muscle aches, chest pain, shortness of breath, mood changes.   Objective:    Blood pressure 122/82, pulse 66, temperature 98 F (36.7 C), temperature source Oral, height 5\' 5"  (1.651 m), weight 181 lb (82.101 kg), SpO2 93.00%.   General: No apparent distress alert and oriented x3 mood and affect normal, dressed appropriately.  HEENT: Pupils equal, extraocular movements intact Respiratory: Patient's speak in full sentences and does not appear short of breath Cardiovascular: No lower extremity edema, non tender, no erythema Skin: Warm dry intact with no signs of infection or rash on extremities or on axial  skeleton. Abdomen: Soft nontender Neuro: Cranial nerves II through XII are intact, neurovascularly intact in all extremities with 2+ DTRs and 2+ pulses. Lymph: No lymphadenopathy of posterior or anterior cervical chain or axillae bilaterally.  Gait normal with good balance and coordination.  MSK: Non tender with full range of motion and good stability and symmetric strength and tone of shoulders, elbows, wrist, knee and ankles bilaterally.  Hip: Left ROM IR: 25 Deg the contralateral side has 35, ER: 35 Deg, Flexion: 120 Deg, Extension: 100 Deg, Abduction: 45 Deg, Adduction: 45 Deg Strength IR: 4/5, ER: 4/5, Flexion: 5/5, Extension: 5/5, Abduction: 3/5, Adduction: 5/5 Pelvic alignment unremarkable to inspection and palpation. Standing hip rotation and gait without trendelenburg sign / unsteadiness. Greater trochanter without tenderness to palpation. No tenderness over piriformis and greater trochanter. No pain with FABER but positive FADIR. No SI joint tenderness and normal minimal SI movement. X-rays were ordered reviewed and interpreted by me today. Patient does have some mild osteoarthritic changes of the hip on x-ray today.  Impression and Recommendations:     This case required medical decision making of moderate complexity.

## 2013-11-04 NOTE — Patient Instructions (Signed)
Very nice to meet you Try exercises most days of the week.  Try following medicines Take tylenol 650 mg three times a day is the best evidence based medicine we have for arthritis.  Glucosamine sulfate 750mg  twice a day is a supplement that has been shown to help moderate to severe arthritis. Vitamin D 1000 IU daily Fish oil 2 grams daily.  Tumeric 500mg  twice daily.  Capsaicin topically up to four times a day may also help with pain. Cortisone injections are an option if these interventions do not seem to make a difference or need more relief. We may do this at follow up If cortisone injections do not help, there are different types of shots that may help but they take longer to take effect.  We can discuss this at follow up.  It's important that you continue to stay active. Controlling your weight is important.  Consider physical therapy to strengthen muscles around the joint that hurts to take pressure off of the joint itself. Shoe inserts with good arch support may be helpful.  [employer] [facility] at [employer] [facility] could help.  Water aerobics and cycling with low resistance are the best two types of exercise for arthritis. Come back and see me in 3-4 weeks.

## 2013-11-04 NOTE — Assessment & Plan Note (Signed)

## 2013-11-04 NOTE — Progress Notes (Signed)
Subjective:    Patient ID: [name], female    DOB: [birth year], 77 y.o.   MRN: [id number]  HPI   Here for wellness and f/u;  Overall doing ok;  Pt denies CP, worsening SOB, DOE, wheezing, orthopnea, PND, worsening LE edema, palpitations, dizziness or syncope.  Pt denies neurological change such as new headache, facial or extremity weakness.  Pt denies polydipsia, polyuria, or low sugar symptoms. Pt states overall good compliance with treatment and medications, good tolerability, and has been trying to follow lower cholesterol diet.  Pt denies worsening depressive symptoms, suicidal ideation or panic. No fever, night sweats, wt loss, loss of appetite, or other constitutional symptoms.  Pt states good ability with ADL's, has low fall risk, home safety reviewed and adequate, no other significant changes in hearing or vision, and only occasionally active with exercise.  Husband died recently.  Has worsening recent leg.groin pain.   Past Medical History  Diagnosis Date  . HYPERLIPIDEMIA 06/22/2008  . DEPRESSION 06/13/2007  . Chalazion 10/30/2007  . HYPERTENSION 10/30/2007  . GERD 06/13/2007  . DIVERTICULOSIS, COLON 06/22/2008  . DISC DISEASE, LUMBAR 06/21/2009  . SCIATICA, LEFT 12/28/2008  . OSTEOPENIA 06/13/2007  . FATIGUE 06/21/2009  . PARESTHESIA, HANDS 10/30/2007  . DVT, HX OF 06/13/2007  . Preventative health care 03/31/2011  . Impaired glucose tolerance 04/02/2012   Past Surgical History  Procedure Laterality Date  . Tubal ligation    . Knee, foot surgury    . Breast lumpectomy  April 2008  . S/p right cataract   Feb. 2012    reports that she has never smoked. She does not have any smokeless tobacco history on file. She reports that she does not drink alcohol or use illicit drugs. family history includes Alzheimer's disease in her mother and sister; Cancer in her father; Hypertension in her mother; Parkinsonism in her mother. Allergies  Allergen Reactions  . Codeine     REACTION: nausea  .  Moxifloxacin   . Penicillins   . Pramipexole Dihydrochloride   . Ropinirole Hydrochloride    Current Outpatient Prescriptions on File Prior to Visit  Medication Sig Dispense Refill  . ANUCORT-HC 25 MG suppository PLACE 1 SUPPOSITORY RECTALLY TWICE DAILY  12 suppository  1  . fexofenadine (ALLEGRA) 180 MG tablet Take 180 mg by mouth daily.        . hydrocortisone (PROCTOZONE-HC) 2.5 % rectal cream Place rectally 2 (two) times daily.  30 g  1  . Lactobacillus (ACIDOPHILUS) TABS Take by mouth daily.        Marland Kitchen latanoprost (XALATAN) 0.005 % ophthalmic solution Place 1 drop into both eyes at bedtime.        . nitrofurantoin (MACRODANTIN) 100 MG capsule Take 100 mg by mouth at bedtime.        . Omega-3 Fatty Acids (FISH OIL) 1000 MG CAPS Take by mouth daily.        Marland Kitchen PROCTOZONE-HC 2.5 % rectal cream PLACE RECTALLY TWICE DAILY AS DIRECTED  30 g  0  . aspirin 81 MG EC tablet Take 81 mg by mouth daily.         No current facility-administered medications on file prior to visit.   Review of Systems Constitutional: Negative for diaphoresis, activity change, appetite change or unexpected weight change.  HENT: Negative for hearing loss, ear pain, facial swelling, mouth sores and neck stiffness.   Eyes: Negative for pain, redness and visual disturbance.  Respiratory: Negative for shortness of breath and  wheezing.   Cardiovascular: Negative for chest pain and palpitations.  Gastrointestinal: Negative for diarrhea, blood in stool, abdominal distention or other pain Genitourinary: Negative for hematuria, flank pain or change in urine volume.  Musculoskeletal: Negative for myalgias and joint swelling.  Skin: Negative for color change and wound.  Neurological: Negative for syncope and numbness. other than noted Hematological: Negative for adenopathy.  Psychiatric/Behavioral: Negative for hallucinations, self-injury, decreased concentration and agitation.      Objective:   Physical Exam BP 122/82   Pulse 66  Temp(Src) 98 F (36.7 C) (Oral)  Ht 5\' 5"  (1.651 m)  Wt 181 lb 2 oz (82.158 kg)  BMI 30.14 kg/m2  SpO2 93% VS noted,  Constitutional: Pt is oriented to person, place, and time. Appears well-developed and well-nourished.  Head: Normocephalic and atraumatic.  Right Ear: External ear normal.  Left Ear: External ear normal.  Nose: Nose normal.  Mouth/Throat: Oropharynx is clear and moist.  Eyes: Conjunctivae and EOM are normal. Pupils are equal, round, and reactive to light.  Neck: Normal range of motion. Neck supple. No JVD present. No tracheal deviation present.  Cardiovascular: Normal rate, regular rhythm, normal heart sounds and intact distal pulses.   Pulmonary/Chest: Effort normal and breath sounds normal.  Abdominal: Soft. Bowel sounds are normal. There is no tenderness. No HSM  Musculoskeletal: Normal range of motion. Exhibits no edema.  Lymphadenopathy:  Has no cervical adenopathy.  Neurological: Pt is alert and oriented to person, place, and time. Pt has normal reflexes. No cranial nerve deficit.  Skin: Skin is warm and dry. No rash noted.  Psychiatric:  Has  normal mood and affect. Behavior is normal. sad today       Assessment & Plan:

## 2013-11-04 NOTE — Assessment & Plan Note (Signed)
Patient is having left hip pain. His left hip pain likely is secondary to osteophytic changes. It seems to be mild to moderate on x-ray today. Patient given over-the-counter medications and home exercise program to try first. This does not seem to get better I would do a trial of an intra-articular hip injection. If patient has benefits we know we are treating the correct anatomy but of no benefit and we will look at her back further with x-rays and potentially MRI if necessary.

## 2013-11-04 NOTE — Patient Instructions (Addendum)
You had the new Prevnar pneumonia shot today You will be contacted regarding the referral for: colonoscopy Please continue all other medications as before, and refills have been done if requested. Please have the pharmacy call with any other refills you may need. Please continue your efforts at being more active, low cholesterol diet, and weight control. You are otherwise up to date with prevention measures today. Please keep your appointments with your specialists as you may have planned  You will be contacted regarding the referral for: Grief Counseling  You also have an appt with Dr Katrinka Blazing for later this morning; please checkin at the front desk as you did to start with today, after:  Please go to the XRAY Department in the Basement (go straight as you get off the elevator) for the x-ray testing Please go to the LAB in the Basement (turn left off the elevator) for the tests to be done today  You will be contacted by phone if any changes need to be made immediately.  Otherwise, you will receive a letter about your results with an explanation, but please check with MyChart first.  Please remember to sign up for My Chart if you have not done so, as this will be important to you in the future with finding out test results, communicating by private email, and scheduling acute appointments online  Please return in 6 months, or sooner if needed

## 2013-11-08 ENCOUNTER — Encounter: Payer: Self-pay | Admitting: Gastroenterology

## 2013-11-10 DIAGNOSIS — F4321 Adjustment disorder with depressed mood: Secondary | ICD-10-CM | POA: Insufficient documentation

## 2013-11-10 DIAGNOSIS — F4320 Adjustment disorder, unspecified: Secondary | ICD-10-CM | POA: Insufficient documentation

## 2013-11-10 NOTE — Assessment & Plan Note (Signed)
For counseling referrral

## 2013-11-10 NOTE — Assessment & Plan Note (Signed)
stable overall by history and exam, recent data reviewed with pt, and pt to continue medical treatment as before,  to f/u any worsening symptoms or concerns Lab Results  Component Value Date   HGBA1C 5.6 11/04/2013

## 2013-11-10 NOTE — Assessment & Plan Note (Signed)
For sport med referral 

## 2013-12-06 ENCOUNTER — Ambulatory Visit: Payer: 59 | Admitting: Family Medicine

## 2013-12-13 ENCOUNTER — Ambulatory Visit (AMBULATORY_SURGERY_CENTER): Payer: Medicare Other | Admitting: *Deleted

## 2013-12-13 VITALS — Ht 65.5 in | Wt 182.6 lb

## 2013-12-13 DIAGNOSIS — Z1211 Encounter for screening for malignant neoplasm of colon: Secondary | ICD-10-CM

## 2013-12-13 MED ORDER — MOVIPREP 100 G PO SOLR: ORAL | Status: DC

## 2013-12-13 NOTE — Progress Notes (Signed)
No allergies to eggs or soy. No problems with anesthesia.  

## 2013-12-15 ENCOUNTER — Encounter: Payer: Self-pay | Admitting: Gastroenterology

## 2013-12-27 ENCOUNTER — Ambulatory Visit (AMBULATORY_SURGERY_CENTER): Payer: Medicare Other | Admitting: Gastroenterology

## 2013-12-27 ENCOUNTER — Encounter: Payer: Self-pay | Admitting: Gastroenterology

## 2013-12-27 VITALS — BP 132/70 | HR 72 | Temp 97.4°F | Resp 17 | Ht 65.0 in | Wt 182.0 lb

## 2013-12-27 DIAGNOSIS — D126 Benign neoplasm of colon, unspecified: Secondary | ICD-10-CM

## 2013-12-27 DIAGNOSIS — Z1211 Encounter for screening for malignant neoplasm of colon: Secondary | ICD-10-CM

## 2013-12-27 DIAGNOSIS — K5730 Diverticulosis of large intestine without perforation or abscess without bleeding: Secondary | ICD-10-CM

## 2013-12-27 HISTORY — PX: COLONOSCOPY WITH PROPOFOL: SHX5780

## 2013-12-27 MED ORDER — SODIUM CHLORIDE 0.9 % IV SOLN: 500.0000 mL | INTRAVENOUS | Status: DC

## 2013-12-27 NOTE — Op Note (Signed)
[name] [place], [address]   COLONOSCOPY PROCEDURE REPORT  PATIENT: Sydney, [name]  MR#: [id number] BIRTHDATE: [birth year] , 69  yrs. old GENDER: Female ENDOSCOPIST: Sable Feil, MD, [facility] REFERRED BY: PROCEDURE DATE:  12/27/2013 PROCEDURE:   Colonoscopy with snare polypectomy First Screening Colonoscopy - Avg.  risk and is 50 yrs.  old or older - No.      History of Adenoma - Now for follow-up colonoscopy & has been > or = to 3 yrs.  N/A  Polyps Removed Today? Yes. ASA CLASS:   Class II INDICATIONS:average risk screening. MEDICATIONS: propofol (Diprivan) 200mg  IV  DESCRIPTION OF PROCEDURE:   After the risks benefits and alternatives of the procedure were thoroughly explained, informed consent was obtained.  A digital rectal exam revealed no abnormalities of the rectum.   The LB [id number]  endoscope was introduced through the anus and advanced to the cecum, which was identified by both the appendix and ileocecal valve. No adverse events experienced.   The quality of the prep was poor, using MoviPrep  The instrument was then slowly withdrawn as the colon was fully examined.      COLON FINDINGS: Mild diverticulosis was noted in the descending colon and sigmoid colon.   A smooth sessile polyp ranging between 3-74mm in size was found in the rectum.  A polypectomy was performed with a cold snare.  The resection was complete and the polyp tissue was completely retrieved.   The colon was otherwise normal.  There was no diverticulosis, inflammation, polyps or cancers unless previously stated.  Retroflexed views revealed no abnormalities. The time to cecum=5 minutes 23 seconds.  Withdrawal time=9 minutes 11 seconds.  The scope was withdrawn and the procedure completed. COMPLICATIONS: There were no complications.  ENDOSCOPIC IMPRESSION: 1.   Mild diverticulosis was noted in the descending colon and sigmoid colon 2.   Sessile polyp  ranging between 3-29mm in size was found in the rectum; polypectomy was performed with a cold snare 3.   The colon was otherwise normal  RECOMMENDATIONS: 1.  Await biopsy results 2.  Repeat colonoscopy in 5 years if polyp adenomatous; otherwise 10 years with double prep !!! 3.  Continue current medications 4.  High fiber diet   eSigned:  Sable Feil, MD, [facility] 12/27/2013 10:02 AM   cc:   PATIENT NAME:  Sydney, [name] MR#: [id number]

## 2013-12-27 NOTE — Progress Notes (Signed)
A/ox3 pleased with MAC, report to Suzanne RN 

## 2013-12-27 NOTE — Patient Instructions (Signed)
YOU HAD AN ENDOSCOPIC PROCEDURE TODAY AT [facility]: Refer to the procedure report that was given to you for any specific questions about what was found during the examination.  If the procedure report does not answer your questions, please call your gastroenterologist to clarify.  If you requested that your care partner not be given the details of your procedure findings, then the procedure report has been included in a sealed envelope for you to review at your convenience later.  YOU SHOULD EXPECT: Some feelings of bloating in the abdomen. Passage of more gas than usual.  Walking can help get rid of the air that was put into your GI tract during the procedure and reduce the bloating. If you had a lower endoscopy (such as a colonoscopy or flexible sigmoidoscopy) you may notice spotting of blood in your stool or on the toilet paper. If you underwent a bowel prep for your procedure, then you may not have a normal bowel movement for a few days.  DIET: Your first meal following the procedure should be a light meal and then it is ok to progress to your normal diet.  A half-sandwich or bowl of soup is an example of a good first meal.  Heavy or fried foods are harder to digest and may make you feel nauseous or bloated.  Likewise meals heavy in dairy and vegetables can cause extra gas to form and this can also increase the bloating.  Drink plenty of fluids but you should avoid alcoholic beverages for 24 hours. Increase the fiber in your diet.  ACTIVITY: Your care partner should take you home directly after the procedure.  You should plan to take it easy, moving slowly for the rest of the day.  You can resume normal activity the day after the procedure however you should NOT DRIVE or use heavy machinery for 24 hours (because of the sedation medicines used during the test).    SYMPTOMS TO REPORT IMMEDIATELY: A gastroenterologist can be reached at any hour.  During normal business hours, 8:30 AM to  5:00 PM Monday through Friday, call [phone].  After hours and on weekends, please call the GI answering service at [phone] who will take a message and have the physician on call contact you.   Following lower endoscopy (colonoscopy or flexible sigmoidoscopy):  Excessive amounts of blood in the stool  Significant tenderness or worsening of abdominal pains  Swelling of the abdomen that is new, acute  Fever of 100F or higher  FOLLOW UP: If any biopsies were taken you will be contacted by phone or by letter within the next 1-3 weeks.  Call your gastroenterologist if you have not heard about the biopsies in 3 weeks.  Our staff will call the home number listed on your records the next business day following your procedure to check on you and address any questions or concerns that you may have at that time regarding the information given to you following your procedure. This is a courtesy call and so if there is no answer at the home number and we have not heard from you through the emergency physician on call, we will assume that you have returned to your regular daily activities without incident.  SIGNATURES/CONFIDENTIALITY: You and/or your care partner have signed paperwork which will be entered into your electronic medical record.  These signatures attest to the fact that that the information above on your After Visit Summary has been reviewed and is understood.  Full  responsibility of the confidentiality of this discharge information lies with you and/or your care-partner.

## 2013-12-27 NOTE — Progress Notes (Signed)
Called to room to assist during endoscopic procedure.  Patient ID and intended procedure confirmed with present staff. Received instructions for my participation in the procedure from the performing physician.  

## 2013-12-28 ENCOUNTER — Telehealth: Payer: Self-pay | Admitting: *Deleted

## 2013-12-28 NOTE — Telephone Encounter (Signed)
  Follow up Call-  Call back number 12/27/2013  Post procedure Call Back phone  # [phone]  Permission to leave phone message Yes     Patient questions:  Do you have a fever, pain , or abdominal swelling? no Pain Score  0 *  Have you tolerated food without any problems? yes  Have you been able to return to your normal activities? yes  Do you have any questions about your discharge instructions: Diet   no Medications  no Follow up visit  no  Do you have questions or concerns about your Care? no  Actions: * If pain score is 4 or above: No action needed, pain <4.  Patient stating that Dr. Sharlett Iles said she did not drank all of her prep, patient stating"but I did drink it all."discussed with patient that the prep does not work the same with everyone. Patient stating she liked the gallon prep any way that living an hour away was a problem.

## 2013-12-31 ENCOUNTER — Encounter: Payer: Self-pay | Admitting: Gastroenterology

## 2014-01-02 ENCOUNTER — Other Ambulatory Visit: Payer: Self-pay | Admitting: Internal Medicine

## 2014-01-04 ENCOUNTER — Other Ambulatory Visit: Payer: Self-pay | Admitting: Internal Medicine

## 2014-01-05 NOTE — Telephone Encounter (Signed)
Xanax refill too soon, I think should still have one refill left since had total 3 mo on rx from dec 18

## 2014-03-20 ENCOUNTER — Other Ambulatory Visit: Payer: Self-pay | Admitting: Internal Medicine

## 2014-03-23 ENCOUNTER — Ambulatory Visit (INDEPENDENT_AMBULATORY_CARE_PROVIDER_SITE_OTHER): Payer: 59

## 2014-03-23 ENCOUNTER — Encounter: Payer: Self-pay | Admitting: Podiatry

## 2014-03-23 ENCOUNTER — Ambulatory Visit (INDEPENDENT_AMBULATORY_CARE_PROVIDER_SITE_OTHER): Payer: 59 | Admitting: Podiatry

## 2014-03-23 VITALS — BP 131/69 | HR 66 | Resp 16 | Ht 66.0 in | Wt 180.0 lb

## 2014-03-23 DIAGNOSIS — M7752 Other enthesopathy of left foot: Secondary | ICD-10-CM

## 2014-03-23 DIAGNOSIS — M7750 Other enthesopathy of unspecified foot: Secondary | ICD-10-CM

## 2014-03-23 DIAGNOSIS — M722 Plantar fascial fibromatosis: Secondary | ICD-10-CM

## 2014-03-23 NOTE — Progress Notes (Signed)
   Subjective:    Patient ID: [name], female    DOB: 10-20-1936, 78 y.o.   MRN: [id number]  HPI Comments: The left lateral side of foot , is sore and painful, last September i had jammed my toes and since this has started up. It can be real tender to the touch , it doesn't hurt all time , it comes and goes , it can start at any time   Foot Pain      Review of Systems  HENT: Positive for hearing loss.        Ringing in ears   Allergic/Immunologic: Positive for environmental allergies.  Hematological: Bruises/bleeds easily.  Psychiatric/Behavioral: The patient is nervous/anxious.        Objective:   Physical Exam: I have reviewed her past medical history medications allergies surgeries social history and review of systems area pulses remain palpable bilateral. Capillary fill time to digits one through 5 is immediate. Neurologic sensorium is intact per since once the monofilament. Deep tendon reflexes are brisk and equal bilateral. Muscle strength is 5 over 5 dorsiflexors plantar flexors inverters everters all intrinsic musculature is intact. Orthopedic evaluation demonstrates all joints distal to the ankle a full range of motion without crepitation. She has pain on palpation medial continued tubercle of her left heel. Radiographic evaluation does demonstrate soft tissue increase in density plantar fascial calcaneal insertion site of her left heel. No fractures are noted. She does have a very prominent soft tissue increase in density at the fifth metatarsal base of the left foot. And a prominent fifth metatarsal styloid.        Assessment & Plan:  Assessment: Bursitis capsulitis fifth metatarsal base left foot.  Plan: Injected the area today with Kenalog and local anesthetic.

## 2014-05-04 ENCOUNTER — Ambulatory Visit: Payer: 59 | Admitting: Podiatry

## 2014-05-16 ENCOUNTER — Other Ambulatory Visit: Payer: Self-pay | Admitting: Internal Medicine

## 2014-05-16 ENCOUNTER — Ambulatory Visit (INDEPENDENT_AMBULATORY_CARE_PROVIDER_SITE_OTHER): Payer: 59 | Admitting: Podiatry

## 2014-05-16 VITALS — BP 106/59 | HR 73 | Resp 16

## 2014-05-16 DIAGNOSIS — M7752 Other enthesopathy of left foot: Secondary | ICD-10-CM

## 2014-05-16 DIAGNOSIS — M7750 Other enthesopathy of unspecified foot: Secondary | ICD-10-CM

## 2014-05-16 NOTE — Progress Notes (Signed)
She presents today for followup of her fifth metatarsal pain. We injected the last time she was in. She states that she's 100% better.  Objective: Pulses are strongly palpable. She has no pain on palpation fifth metatarsal of the left foot.  Assessment: Well-healing capsulitis fifth metatarsal base left foot.  Plan:

## 2014-05-17 NOTE — Telephone Encounter (Signed)
Done hardcopy to robin  

## 2014-05-17 NOTE — Telephone Encounter (Signed)
Faxed hardcopy to [facility] [place]

## 2014-05-26 ENCOUNTER — Other Ambulatory Visit: Payer: Self-pay | Admitting: Internal Medicine

## 2014-06-06 ENCOUNTER — Telehealth: Payer: Self-pay

## 2014-06-06 MED ORDER — TIZANIDINE HCL 4 MG PO TABS: 4.0000 mg | ORAL_TABLET | Freq: Three times a day (TID) | ORAL | Status: DC | PRN

## 2014-06-06 NOTE — Telephone Encounter (Signed)
Received PA DENIAL for Cyclobenzaprine due to Medicare stating this medication is on their high risk list for patients over 65 due to potential side effects. Advise please on offering this patient a safer alternative.

## 2014-06-06 NOTE — Addendum Note (Signed)
Addended by: Biagio Borg on: 06/06/2014 06:54 PM   Modules accepted: Orders, Medications

## 2014-06-06 NOTE — Telephone Encounter (Signed)
Called [facility] to initiate PA for Cyclobenzaprine 5 mg tablet, to take 1 by mouth three times daily for muscle spasms.   Called [phone],  MM#[id number].  The case has been turned over to a pharmacist and will get a fax in 2 to 3 business days or either approval or denial.  Called in the patient informed PA started and will call her back to inform of response.

## 2014-06-06 NOTE — Telephone Encounter (Signed)
Ok change to zanaflex prn - done erx

## 2014-06-07 NOTE — Telephone Encounter (Signed)
Called the patient left a detailed message of Denial and change in medication.

## 2014-07-05 ENCOUNTER — Other Ambulatory Visit: Payer: Self-pay | Admitting: Internal Medicine

## 2014-07-05 NOTE — Telephone Encounter (Signed)
Done erx 

## 2014-08-25 ENCOUNTER — Other Ambulatory Visit: Payer: Self-pay | Admitting: Internal Medicine

## 2014-09-01 ENCOUNTER — Other Ambulatory Visit: Payer: Self-pay | Admitting: Internal Medicine

## 2014-09-02 ENCOUNTER — Other Ambulatory Visit: Payer: Self-pay

## 2014-09-05 ENCOUNTER — Encounter: Payer: Self-pay | Admitting: Internal Medicine

## 2014-10-07 ENCOUNTER — Telehealth: Payer: Self-pay | Admitting: Internal Medicine

## 2014-11-08 ENCOUNTER — Other Ambulatory Visit (INDEPENDENT_AMBULATORY_CARE_PROVIDER_SITE_OTHER): Payer: Medicare Other

## 2014-11-08 ENCOUNTER — Encounter: Payer: Self-pay | Admitting: Internal Medicine

## 2014-11-08 ENCOUNTER — Ambulatory Visit (INDEPENDENT_AMBULATORY_CARE_PROVIDER_SITE_OTHER): Payer: Medicare Other | Admitting: Internal Medicine

## 2014-11-08 VITALS — BP 132/82 | HR 70 | Temp 98.1°F | Ht 65.0 in | Wt 197.4 lb

## 2014-11-08 DIAGNOSIS — Z0000 Encounter for general adult medical examination without abnormal findings: Secondary | ICD-10-CM

## 2014-11-08 DIAGNOSIS — E785 Hyperlipidemia, unspecified: Secondary | ICD-10-CM

## 2014-11-08 DIAGNOSIS — M25532 Pain in left wrist: Secondary | ICD-10-CM

## 2014-11-08 DIAGNOSIS — R7302 Impaired glucose tolerance (oral): Secondary | ICD-10-CM

## 2014-11-08 DIAGNOSIS — I10 Essential (primary) hypertension: Secondary | ICD-10-CM

## 2014-11-08 LAB — BASIC METABOLIC PANEL
BUN: 19 mg/dL (ref 6–23)
CO2: 27 meq/L (ref 19–32)
Calcium: 8.9 mg/dL (ref 8.4–10.5)
Chloride: 100 mEq/L (ref 96–112)
Creatinine, Ser: 0.7 mg/dL (ref 0.4–1.2)
GFR: 84.98 mL/min (ref >60.00)
Glucose, Bld: 82 mg/dL (ref 70–99)
Potassium: 4 mEq/L (ref 3.5–5.1)
Sodium: 135 mEq/L (ref 135–145)

## 2014-11-08 LAB — CBC WITH DIFFERENTIAL/PLATELET
BASOS PCT: 0.5 % (ref 0.0–3.0)
Basophils Absolute: 0 10*3/uL (ref 0.0–0.1)
EOS PCT: 2.6 % (ref 0.0–5.0)
Eosinophils Absolute: 0.2 10*3/uL (ref 0.0–0.7)
HCT: 39.4 % (ref 36.0–46.0)
HEMOGLOBIN: 13 g/dL (ref 12.0–15.0)
LYMPHS PCT: 21.5 % (ref 12.0–46.0)
Lymphs Abs: 1.8 10*3/uL (ref 0.7–4.0)
MCHC: 32.9 g/dL (ref 30.0–36.0)
MCV: 93.4 fl (ref 78.0–100.0)
MONOS PCT: 5.9 % (ref 3.0–12.0)
Monocytes Absolute: 0.5 10*3/uL (ref 0.1–1.0)
NEUTROS ABS: 5.9 10*3/uL (ref 1.4–7.7)
Neutrophils Relative %: 69.5 % (ref 43.0–77.0)
Platelets: 250 10*3/uL (ref 150.0–400.0)
RBC: 4.22 Mil/uL (ref 3.87–5.11)
RDW: 13.5 % (ref 11.5–15.5)
WBC: 8.5 10*3/uL (ref 4.0–10.5)

## 2014-11-08 LAB — URINALYSIS, ROUTINE W REFLEX MICROSCOPIC
Bilirubin Urine: NEGATIVE
Hgb urine dipstick: NEGATIVE
Ketones, ur: NEGATIVE
Leukocytes, UA: NEGATIVE
Nitrite: NEGATIVE
RBC / HPF: NONE SEEN (ref 0–?)
SPECIFIC GRAVITY, URINE: 1.01 (ref 1.000–1.030)
TOTAL PROTEIN, URINE-UPE24: NEGATIVE
Urine Glucose: NEGATIVE
Urobilinogen, UA: 0.2 (ref 0.0–1.0)
WBC UA: NONE SEEN (ref 0–?)
pH: 5.5 (ref 5.0–8.0)

## 2014-11-08 LAB — LIPID PANEL
Cholesterol: 191 mg/dL (ref 0–200)
HDL: 51.2 mg/dL (ref >39.00)
LDL Cholesterol: 127 mg/dL — ABNORMAL HIGH (ref 0–99)
NONHDL: 139.8
Total CHOL/HDL Ratio: 4
Triglycerides: 63 mg/dL (ref 0.0–149.0)
VLDL: 12.6 mg/dL (ref 0.0–40.0)

## 2014-11-08 LAB — HEPATIC FUNCTION PANEL
ALBUMIN: 4.3 g/dL (ref 3.5–5.2)
ALT: 20 U/L (ref 0–35)
AST: 24 U/L (ref 0–37)
Alkaline Phosphatase: 64 U/L (ref 39–117)
Bilirubin, Direct: 0.1 mg/dL (ref 0.0–0.3)
Total Bilirubin: 0.4 mg/dL (ref 0.2–1.2)
Total Protein: 8 g/dL (ref 6.0–8.3)

## 2014-11-08 LAB — HEMOGLOBIN A1C: HEMOGLOBIN A1C: 6 % (ref 4.6–6.5)

## 2014-11-08 LAB — TSH: TSH: 3.23 u[IU]/mL (ref 0.35–4.50)

## 2014-11-08 MED ORDER — HYDROCHLOROTHIAZIDE 25 MG PO TABS: ORAL_TABLET | ORAL | Status: DC

## 2014-11-08 MED ORDER — ALPRAZOLAM 0.25 MG PO TABS: 0.2500 mg | ORAL_TABLET | Freq: Two times a day (BID) | ORAL | Status: DC | PRN

## 2014-11-08 MED ORDER — TIZANIDINE HCL 4 MG PO TABS: 4.0000 mg | ORAL_TABLET | Freq: Three times a day (TID) | ORAL | Status: DC | PRN

## 2014-11-08 MED ORDER — PRAVASTATIN SODIUM 40 MG PO TABS: 40.0000 mg | ORAL_TABLET | Freq: Every day | ORAL | Status: DC

## 2014-11-08 MED ORDER — FLUCONAZOLE 100 MG PO TABS: ORAL_TABLET | ORAL | Status: DC

## 2014-11-08 MED ORDER — GABAPENTIN 300 MG PO CAPS: 600.0000 mg | ORAL_CAPSULE | Freq: Three times a day (TID) | ORAL | Status: DC

## 2014-11-08 MED ORDER — SERTRALINE HCL 100 MG PO TABS: ORAL_TABLET | ORAL | Status: DC

## 2014-11-08 NOTE — Progress Notes (Signed)
Subjective:    Patient ID: [name], female    DOB: 1935/12/12, 78 y.o.   MRN: [id number]  HPI   Here for wellness and f/u;  Overall doing ok;  Pt denies CP, worsening SOB, DOE, wheezing, orthopnea, PND, worsening LE edema, palpitations, dizziness or syncope.  Pt denies neurological change such as new headache, facial or extremity weakness.  Pt denies polydipsia, polyuria, or low sugar symptoms. Pt states overall good compliance with treatment and medications, good tolerability, and has been trying to follow lower cholesterol diet.  Pt denies worsening depressive symptoms, suicidal ideation or panic. No fever, night sweats, wt loss, loss of appetite, or other constitutional symptoms.  Pt states good ability with ADL's, has low fall risk, home safety reviewed and adequate, no other significant changes in hearing or vision, and only occasionally active with exercise.  Does have some left wrist pain radiating to first few fingers.  Pt denies polydipsia, polyuria,\  Past Medical History  Diagnosis Date  . HYPERLIPIDEMIA 06/22/2008  . DEPRESSION 06/13/2007  . Chalazion 10/30/2007  . HYPERTENSION 10/30/2007  . GERD 06/13/2007  . DIVERTICULOSIS, COLON 06/22/2008  . [place] DISEASE, LUMBAR 06/21/2009  . SCIATICA, LEFT 12/28/2008  . OSTEOPENIA 06/13/2007  . FATIGUE 06/21/2009  . PARESTHESIA, HANDS 10/30/2007  . DVT, HX OF 06/13/2007  . Preventative health care 03/31/2011  . Impaired glucose tolerance 04/02/2012  . Restless leg   . Allergy   . Glaucoma    Past Surgical History  Procedure Laterality Date  . Tubal ligation  1970  . Knee arthroscopy Left 2002  . Breast lumpectomy Left April 2008  . S/p right cataract   Feb. 2012  . Back surgery  2011  . Cervical fusion  2011  . Foot surgery Right 1998    reports that she has never smoked. She has never used smokeless tobacco. She reports that she does not drink alcohol or use illicit drugs. family history includes Alzheimer's disease in her mother and  sister; Cancer in her father; Hypertension in her mother; Parkinsonism in her mother. There is no history of Colon cancer. Allergies  Allergen Reactions  . Codeine     REACTION: nausea  . Moxifloxacin     Yeast infection  . Penicillins     Yeast infection  . Pramipexole Dihydrochloride     Per pt: unknown  . Ropinirole Hydrochloride     Per pt: unknown   Current Outpatient Prescriptions on File Prior to Visit  Medication Sig Dispense Refill  . acetaminophen (TYLENOL) 500 MG tablet Take 500 mg by mouth every 6 (six) hours as needed.    . ANUCORT-HC 25 MG suppository UNWRAP AND INSERT 1 SUPPOSITORY RECTALLY TWICE DAILY AS DIRECTED 12 suppository 1  . aspirin 81 MG EC tablet Take 81 mg by mouth daily.      . clobetasol cream (TEMOVATE) 0.05 % Apply topically 2 (two) times daily. Apply topically. 30 g 1  . CRANBERRY FRUIT PO Take by mouth daily.    Marland Kitchen estradiol (ESTRACE) 0.1 MG/GM vaginal cream Place 1 Applicatorful vaginally 2 (two) times a week.    . fexofenadine (ALLEGRA) 180 MG tablet Take 180 mg by mouth daily.      . hydrocortisone (PROCTOZONE-HC) 2.5 % rectal cream Place rectally 2 (two) times daily. 30 g 1  . Lactobacillus (ACIDOPHILUS) TABS Take by mouth daily.      Marland Kitchen latanoprost (XALATAN) 0.005 % ophthalmic solution Place 1 drop into both eyes at bedtime.      Marland Kitchen  Multiple Vitamins-Minerals (MULTIVITAMIN PO) Take by mouth daily.    Marland Kitchen nystatin cream (MYCOSTATIN) Apply 1 application topically as needed for dry skin.    . Omega-3 Fatty Acids (FISH OIL) 1000 MG CAPS Take by mouth daily.      Marland Kitchen PROCTOZONE-HC 2.5 % rectal cream PLACE RECTALLY TWICE DAILY AS DIRECTED 30 g 2  . docusate sodium (COLACE) 100 MG capsule Take 100 mg by mouth daily.     No current facility-administered medications on file prior to visit.   Review of Systems Constitutional: Negative for increased diaphoresis, other activity, appetite or other siginficant weight change  HENT: Negative for worsening hearing  loss, ear pain, facial swelling, mouth sores and neck stiffness.   Eyes: Negative for other worsening pain, redness or visual disturbance.  Respiratory: Negative for shortness of breath and wheezing.   Cardiovascular: Negative for chest pain and palpitations.  Gastrointestinal: Negative for diarrhea, blood in stool, abdominal distention or other pain Genitourinary: Negative for hematuria, flank pain or change in urine volume.  Musculoskeletal: Negative for myalgias or other joint complaints.  Skin: Negative for color change and wound.  Neurological: Negative for syncope and numbness. other than noted Hematological: Negative for adenopathy. or other swelling Psychiatric/Behavioral: Negative for hallucinations, self-injury, decreased concentration or other worsening agitation.      Objective:   Physical Exam BP 132/82 mmHg  Pulse 70  Temp(Src) 98.1 F (36.7 C) (Oral)  Ht 5\' 5"  (1.651 m)  Wt 197 lb 6 oz (89.529 kg)  BMI 32.85 kg/m2  SpO2 95% VS noted,  Constitutional: Pt is oriented to person, place, and time. Appears well-developed and well-nourished.  Head: Normocephalic and atraumatic.  Right Ear: External ear normal.  Left Ear: External ear normal.  Nose: Nose normal.  Mouth/Throat: Oropharynx is clear and moist.  Eyes: Conjunctivae and EOM are normal. Pupils are equal, round, and reactive to light.  Neck: Normal range of motion. Neck supple. No JVD present. No tracheal deviation present.  Cardiovascular: Normal rate, regular rhythm, normal heart sounds and intact distal pulses.   Pulmonary/Chest: Effort normal and breath sounds without rales or wheezing  Abdominal: Soft. Bowel sounds are normal. NT. No HSM  Musculoskeletal: Normal range of motion. Exhibits no edema.  Lymphadenopathy:  Has no cervical adenopathy.  Neurological: Pt is alert and oriented to person, place, and time. Pt has normal reflexes. No cranial nerve deficit. Motor intact Skin: Skin is warm and dry. No rash  noted.  Psychiatric:  Has normal mood and affect. Behavior is normal.  Right wrist FROM, no effusion, NT    Assessment & Plan:

## 2014-11-08 NOTE — Patient Instructions (Signed)
Please continue all other medications as before, and refills have been done if requested - the xanax and gabapentin  Please have the pharmacy call with any other refills you may need.  Please continue your efforts at being more active, low cholesterol diet, and weight control.  You are otherwise up to date with prevention measures today.  Please keep your appointments with your specialists as you may have planned  Please try a left wrist splint for the left wrist pain at night  If not improved, please call for Hand Surgury referral  Please go to the LAB in the Basement (turn left off the elevator) for the tests to be done today  You will be contacted by phone if any changes need to be made immediately.  Otherwise, you will receive a letter about your results with an explanation, but please check with MyChart first.  Please remember to sign up for MyChart if you have not done so, as this will be important to you in the future with finding out test results, communicating by private email, and scheduling acute appointments online when needed.  Please return in 1 year for your yearly visit, or sooner if needed, with Lab testing done 3-5 days before

## 2014-11-08 NOTE — Progress Notes (Signed)
Pre visit review using our clinic review tool, if applicable. No additional management support is needed unless otherwise documented below in the visit note. 

## 2014-11-13 NOTE — Assessment & Plan Note (Signed)
?  neuritic vs msk - for left wrist splint trial, consider further such as NCS if not improved

## 2014-11-13 NOTE — Assessment & Plan Note (Signed)

## 2014-11-13 NOTE — Assessment & Plan Note (Signed)
stable overall by history and exam, recent data reviewed with pt, and pt to continue medical treatment as before,  to f/u any worsening symptoms or concerns Lab Results  Component Value Date   HGBA1C 6.0 11/08/2014

## 2014-11-25 ENCOUNTER — Other Ambulatory Visit: Payer: Self-pay | Admitting: Internal Medicine

## 2014-12-20 ENCOUNTER — Encounter: Payer: Self-pay | Admitting: Internal Medicine

## 2014-12-20 ENCOUNTER — Ambulatory Visit (INDEPENDENT_AMBULATORY_CARE_PROVIDER_SITE_OTHER): Payer: Medicare Other | Admitting: Internal Medicine

## 2014-12-20 ENCOUNTER — Other Ambulatory Visit: Payer: Self-pay | Admitting: Internal Medicine

## 2014-12-20 VITALS — BP 162/80 | HR 69 | Temp 97.5°F | Ht 66.0 in | Wt 186.0 lb

## 2014-12-20 DIAGNOSIS — R42 Dizziness and giddiness: Secondary | ICD-10-CM

## 2014-12-20 DIAGNOSIS — I10 Essential (primary) hypertension: Secondary | ICD-10-CM

## 2014-12-20 DIAGNOSIS — I9589 Other hypotension: Secondary | ICD-10-CM

## 2014-12-20 MED ORDER — AMLODIPINE BESYLATE 2.5 MG PO TABS: 2.5000 mg | ORAL_TABLET | Freq: Every day | ORAL | Status: DC

## 2014-12-20 NOTE — Assessment & Plan Note (Addendum)
With several documented episodes of low blood pressure per home monitor, not orthostatic today, /ECG reviewed as per emr  Diff includes arrythmia or med side effect; to d/c hct, start amlod 2.5 low dose, cardiac event monitor x 2 wks, echo, refer cardiology  Note:  Total time for pt hx, exam, review of record with pt in the room, determination of diagnoses and plan for further eval and tx is > 40 min, with over 50% spent in coordination and counseling of patient

## 2014-12-20 NOTE — Progress Notes (Signed)
Subjective:    Patient ID: [name], female    DOB: 1936/10/23, 79 y.o.   MRN: [id number]  HPI  Here to f/u with several quite low BP's with sbp  In the 80's, always semes to occur first in the AM or overnight with getting up OOB, not assoc with low HR per pt (HR usually in the 60's), but several times assoc with dizziness/swimmyheaded.   Pt denies chest pain, increased sob or doe, wheezing, orthopnea, PND, increased LE swelling, palpitations, or syncope.  Has been on HCT 12.5 only for BP but does take on regular basis, good compliance, has not had significant problem in past.  SBP often in the 150's later in the day.  Pt denies fever, wt loss, night sweats, loss of appetite, or other constitutional symptoms Last ecg 10/2013  - SR.  No hx of tachy/brady or other arrythmia. No prior echo, holter, or stress test. Pt thinks Bp machine at home is reliable Past Medical History  Diagnosis Date  . HYPERLIPIDEMIA 06/22/2008  . DEPRESSION 06/13/2007  . Chalazion 10/30/2007  . HYPERTENSION 10/30/2007  . GERD 06/13/2007  . DIVERTICULOSIS, COLON 06/22/2008  . [place] DISEASE, LUMBAR 06/21/2009  . SCIATICA, LEFT 12/28/2008  . OSTEOPENIA 06/13/2007  . FATIGUE 06/21/2009  . PARESTHESIA, HANDS 10/30/2007  . DVT, HX OF 06/13/2007  . Preventative health care 03/31/2011  . Impaired glucose tolerance 04/02/2012  . Restless leg   . Allergy   . Glaucoma    Past Surgical History  Procedure Laterality Date  . Tubal ligation  1970  . Knee arthroscopy Left 2002  . Breast lumpectomy Left April 2008  . S/p right cataract   Feb. 2012  . Back surgery  2011  . Cervical fusion  2011  . Foot surgery Right 1998    reports that she has never smoked. She has never used smokeless tobacco. She reports that she does not drink alcohol or use illicit drugs. family history includes Alzheimer's disease in her mother and sister; Cancer in her father; Hypertension in her mother; Parkinsonism in her mother. There is no history of Colon  cancer. Allergies  Allergen Reactions  . Codeine     REACTION: nausea  . Moxifloxacin     Yeast infection  . Penicillins     Yeast infection  . Pramipexole Dihydrochloride     Per pt: unknown  . Ropinirole Hydrochloride     Per pt: unknown   Current Outpatient Prescriptions on File Prior to Visit  Medication Sig Dispense Refill  . acetaminophen (TYLENOL) 500 MG tablet Take 500 mg by mouth every 6 (six) hours as needed.    . ALPRAZolam (XANAX) 0.25 MG tablet Take 1 tablet (0.25 mg total) by mouth 2 (two) times daily as needed. 60 tablet 2  . ANUCORT-HC 25 MG suppository UNWRAP AND INSERT 1 SUPPOSITORY RECTALLY TWICE DAILY AS DIRECTED 12 suppository 1  . aspirin 81 MG EC tablet Take 81 mg by mouth daily.      . clobetasol cream (TEMOVATE) 0.05 % Apply topically 2 (two) times daily. Apply topically. 30 g 1  . CRANBERRY FRUIT PO Take by mouth daily.    Marland Kitchen docusate sodium (COLACE) 100 MG capsule Take 100 mg by mouth daily.    Marland Kitchen estradiol (ESTRACE) 0.1 MG/GM vaginal cream Place 1 Applicatorful vaginally 2 (two) times a week.    . fexofenadine (ALLEGRA) 180 MG tablet Take 180 mg by mouth daily.      . fluconazole (DIFLUCAN) 100  MG tablet 1 by mouth once daily for 3 days as needed 12 tablet 2  . gabapentin (NEURONTIN) 300 MG capsule Take 2 capsules (600 mg total) by mouth 3 (three) times daily. 540 capsule 1  . hydrochlorothiazide (HYDRODIURIL) 25 MG tablet TAKE  1/2 TABLET BY MOUTH DAILY IN THE MORNING 100 tablet 3  . hydrocortisone (PROCTOZONE-HC) 2.5 % rectal cream Place rectally 2 (two) times daily. 30 g 1  . latanoprost (XALATAN) 0.005 % ophthalmic solution Place 1 drop into both eyes at bedtime.      . Multiple Vitamins-Minerals (MULTIVITAMIN PO) Take by mouth daily.    Marland Kitchen nystatin cream (MYCOSTATIN) Apply 1 application topically as needed for dry skin.    . Omega-3 Fatty Acids (FISH OIL) 1000 MG CAPS Take by mouth daily.      . pravastatin (PRAVACHOL) 40 MG tablet Take 1 tablet (40 mg  total) by mouth daily. 90 tablet 3  . PROCTOZONE-HC 2.5 % rectal cream PLACE RECTALLY TWICE DAILY AS DIRECTED 30 g 2  . sertraline (ZOLOFT) 100 MG tablet TAKE 2 TABLETS BY MOUTH EVERY DAY 180 tablet 3  . tiZANidine (ZANAFLEX) 4 MG tablet Take 1 tablet (4 mg total) by mouth every 8 (eight) hours as needed for muscle spasms. 90 tablet 3   No current facility-administered medications on file prior to visit.   Review of Systems  Constitutional: Negative for unusual diaphoresis or other sweats  HENT: Negative for ringing in ear Eyes: Negative for double vision or worsening visual disturbance.  Respiratory: Negative for choking and stridor.   Gastrointestinal: Negative for vomiting or other signifcant bowel change Genitourinary: Negative for hematuria or decreased urine volume.  Musculoskeletal: Negative for other MSK pain or swelling Skin: Negative for color change and worsening wound.  Neurological: Negative for tremors and numbness other than noted  Psychiatric/Behavioral: Negative for decreased concentration or agitation other than above       Objective:   Physical Exam BP 152/90 mmHg  Pulse 69  Temp(Src) 97.5 F (36.4 C) (Oral)  Ht 5\' 6"  (1.676 m)  Wt 186 lb (84.369 kg)  BMI 30.04 kg/m2  SpO2 93% Orthostatic BP's reviewed - not orthostatic VS noted,  Constitutional: Pt appears well-developed, well-nourished.  HENT: Head: NCAT.  Right Ear: External ear normal.  Left Ear: External ear normal.  Eyes: . Pupils are equal, round, and reactive to light. Conjunctivae and EOM are normal Neck: Normal range of motion. Neck supple.  Cardiovascular: Normal rate and regular rhythm.   Pulmonary/Chest: Effort normal and breath sounds without rales or wheezing.  Abd:  Soft, NT, +BS, ND Neurological: Pt is alert. Not confused , motor grossly intact Skin: Skin is warm. No rash Psychiatric: Pt behavior is normal. No agitation.     Assessment & Plan:

## 2014-12-20 NOTE — Progress Notes (Signed)
Pre visit review using our clinic review tool, if applicable. No additional management support is needed unless otherwise documented below in the visit note. 

## 2014-12-20 NOTE — Patient Instructions (Signed)
Ok to stop the microzide  Please take all new medication as prescribed - the amlodipine 2.5 mg per day  Please continue all other medications as before, and refills have been done if requested.  Please have the pharmacy call with any other refills you may need.  Please keep your appointments with your specialists as you may have planned  You will be contacted regarding the referral for: Cardiac event monitor, Echocardiogram, and Cardiology

## 2014-12-22 ENCOUNTER — Ambulatory Visit: Payer: Self-pay | Admitting: Internal Medicine

## 2014-12-27 ENCOUNTER — Telehealth: Payer: Self-pay | Admitting: Internal Medicine

## 2014-12-27 NOTE — Telephone Encounter (Signed)
Patient is a t home this morning and will not be leaving for the time being. Please call home # first

## 2014-12-27 NOTE — Telephone Encounter (Signed)
Pt called in and said that meds were changed on last visit and her bp is high and wanted to speak with nurse.     Best number  [phone] daughter cell number

## 2014-12-27 NOTE — Telephone Encounter (Signed)
As long as not feeling weak or dizzy with the lower blood pressures, ok to continue the current medication. thanks

## 2014-12-27 NOTE — Telephone Encounter (Signed)
Tried home number and its not accepting calls.  Left message on cell phone to return call

## 2014-12-27 NOTE — Telephone Encounter (Signed)
Pt started Norvasc and now her BP has been 183/90 p 93 lowest was 113/52 last night.  She had a prednisone injection in her knee and did not know if that would make her BP go up.  She states her BP is up and down a lot.  Please advise

## 2014-12-29 ENCOUNTER — Telehealth: Payer: Self-pay | Admitting: Internal Medicine

## 2014-12-29 ENCOUNTER — Encounter: Payer: Self-pay | Admitting: *Deleted

## 2014-12-29 ENCOUNTER — Ambulatory Visit (HOSPITAL_COMMUNITY): Payer: Medicare Other | Attending: Cardiology

## 2014-12-29 ENCOUNTER — Encounter (INDEPENDENT_AMBULATORY_CARE_PROVIDER_SITE_OTHER): Payer: Medicare Other

## 2014-12-29 DIAGNOSIS — I9589 Other hypotension: Secondary | ICD-10-CM

## 2014-12-29 DIAGNOSIS — I10 Essential (primary) hypertension: Secondary | ICD-10-CM | POA: Diagnosis not present

## 2014-12-29 DIAGNOSIS — R42 Dizziness and giddiness: Secondary | ICD-10-CM

## 2014-12-29 DIAGNOSIS — I340 Nonrheumatic mitral (valve) insufficiency: Secondary | ICD-10-CM

## 2014-12-29 NOTE — Progress Notes (Signed)
2D Echo completed. 12/29/2014

## 2014-12-29 NOTE — Telephone Encounter (Signed)
Dizziness was added a dx.  Cardiology aware

## 2014-12-29 NOTE — Telephone Encounter (Signed)
Left message for pt to call back  °

## 2014-12-29 NOTE — Progress Notes (Signed)
Patient ID: [name], female   DOB: 07-Jan-1936, 79 y.o.   MRN: [id number] [employer] 14 day cardiac event monitor to be applied to patient.

## 2014-12-29 NOTE — Telephone Encounter (Signed)
Pt verbalized understanding and had no questions 

## 2014-12-29 NOTE — Telephone Encounter (Signed)
Ivin Booty from [facility] called stating they cannot use hypertension or hypotension for diagnosis for holter monitor. She states there is mention in the last office note of pt having dizziness. Can we change this dx to dizziness please? Patient is at the facility now.

## 2014-12-30 ENCOUNTER — Other Ambulatory Visit: Payer: Self-pay | Admitting: Internal Medicine

## 2015-02-09 ENCOUNTER — Encounter: Payer: Self-pay | Admitting: Cardiovascular Disease

## 2015-02-09 ENCOUNTER — Ambulatory Visit (INDEPENDENT_AMBULATORY_CARE_PROVIDER_SITE_OTHER): Payer: Medicare Other | Admitting: Cardiovascular Disease

## 2015-02-09 VITALS — BP 172/78 | HR 62 | Ht 65.0 in | Wt 184.8 lb

## 2015-02-09 DIAGNOSIS — I10 Essential (primary) hypertension: Secondary | ICD-10-CM

## 2015-02-09 NOTE — Progress Notes (Signed)
Patient ID: [name], female   DOB: 03/29/1936, 79 y.o.   MRN: [id number]     Cardiology Office Note   Date:  02/09/2015   ID:  [name], DOB [birth year], MRN [id number]  PCP:  Cathlean Cower, MD  Cardiologist:   Jenkins Rouge, MD   No chief complaint on file.     History of Present Illness: [name] is a 79 y.o. female who presents for labile BP.  Review of his records seem to show history of HTN with some low readings at home Diuretic Stopped and now on low dose amlodipine.  Reviewed recent echo and holter monitor both benign with normal EF and no signficiant arrhythmia just PAC  She notes low BP at night  Looking at her meds she takes gabapentin and tizanadine musle relaxant at night  This temporally coincides with her labile  BP since January.  No syncope or palpitations tolerating norvasc that she takes in am     Echo 12/29/14 Study Conclusions  - Left ventricle: The cavity size was normal. There was mild focal basal hypertrophy of the septum. Systolic function was vigorous. The estimated ejection fraction was in the range of 65% to 70%. Wall motion was normal; there were no regional wall motion abnormalities. Features are consistent with a pseudonormal left ventricular filling pattern, with concomitant abnormal relaxation and increased filling pressure (grade 2 diastolic dysfunction). Doppler parameters are consistent with elevated ventricular end-diastolic filling pressure. - Aortic valve: There was no regurgitation. - Aortic root: The aortic root was normal in size. - Mitral valve: There was mild to moderate regurgitation directed centrally. - Left atrium: The atrium was at the upper limits of normal in size. - Right ventricle: Systolic function was normal. - Right atrium: The atrium was normal in size. - Tricuspid valve: There was mild regurgitation. - Pulmonic valve: There was no regurgitation. - Pulmonary arteries: Systolic pressure was  moderately increased. PA peak pressure: 55 mm Hg (S). - Inferior vena cava: The vessel was normal in size. - Pericardium, extracardiac: There was no pericardial effusion.    Past Medical History  Diagnosis Date  . HYPERLIPIDEMIA 06/22/2008  . DEPRESSION 06/13/2007  . Chalazion 10/30/2007  . HYPERTENSION 10/30/2007  . GERD 06/13/2007  . DIVERTICULOSIS, COLON 06/22/2008  . [place] DISEASE, LUMBAR 06/21/2009  . SCIATICA, LEFT 12/28/2008  . OSTEOPENIA 06/13/2007  . FATIGUE 06/21/2009  . PARESTHESIA, HANDS 10/30/2007  . DVT, HX OF 06/13/2007  . Preventative health care 03/31/2011  . Impaired glucose tolerance 04/02/2012  . Restless leg   . Allergy   . Glaucoma     Past Surgical History  Procedure Laterality Date  . Tubal ligation  1970  . Knee arthroscopy Left 2002  . Breast lumpectomy Left April 2008  . S/p right cataract   Feb. 2012  . Back surgery  2011  . Cervical fusion  2011  . Foot surgery Right 1998     Current Outpatient Prescriptions  Medication Sig Dispense Refill  . acetaminophen (TYLENOL) 500 MG tablet Take 500 mg by mouth every 6 (six) hours as needed.    . ALPRAZolam (XANAX) 0.25 MG tablet Take 1 tablet (0.25 mg total) by mouth 2 (two) times daily as needed. 60 tablet 2  . amLODipine (NORVASC) 2.5 MG tablet Take 1 tablet (2.5 mg total) by mouth daily. 90 tablet 3  . ANUCORT-HC 25 MG suppository UNWRAP AND INSERT 1 SUPPOSITORY RECTALLY TWICE DAILY AS DIRECTED 12 suppository 1  . aspirin  81 MG EC tablet Take 81 mg by mouth daily.      . clobetasol cream (TEMOVATE) 0.05 % Apply topically 2 (two) times daily. Apply topically. 30 g 1  . CRANBERRY FRUIT PO Take by mouth daily.    Marland Kitchen docusate sodium (COLACE) 100 MG capsule Take 100 mg by mouth daily.    Marland Kitchen estradiol (ESTRACE) 0.1 MG/GM vaginal cream Place 1 Applicatorful vaginally 2 (two) times a week.    . fexofenadine (ALLEGRA) 180 MG tablet Take 180 mg by mouth daily.      . fluconazole (DIFLUCAN) 100 MG tablet 1 by mouth once  daily for 3 days as needed 12 tablet 2  . gabapentin (NEURONTIN) 300 MG capsule Take 2 capsules (600 mg total) by mouth 3 (three) times daily. 540 capsule 1  . gabapentin (NEURONTIN) 300 MG capsule TAKE 2 CAPSULES BY MOUTH THREE TIMES DAILY 540 capsule 0  . hydrocortisone (PROCTOZONE-HC) 2.5 % rectal cream Place rectally 2 (two) times daily. 30 g 1  . Lactobacillus-Inulin ([place]) CHEW Chew by mouth once.    . latanoprost (XALATAN) 0.005 % ophthalmic solution Place 1 drop into both eyes at bedtime.      . Multiple Vitamins-Minerals (MULTIVITAMIN PO) Take by mouth daily.    Marland Kitchen nystatin cream (MYCOSTATIN) Apply 1 application topically as needed for dry skin.    . Omega-3 Fatty Acids (FISH OIL) 1000 MG CAPS Take by mouth daily.      . pravastatin (PRAVACHOL) 40 MG tablet Take 1 tablet (40 mg total) by mouth daily. 90 tablet 3  . PROCTOZONE-HC 2.5 % rectal cream PLACE RECTALLY TWICE DAILY AS DIRECTED 30 g 2  . sertraline (ZOLOFT) 100 MG tablet TAKE 2 TABLETS BY MOUTH EVERY DAY 180 tablet 3  . sertraline (ZOLOFT) 100 MG tablet TAKE 2 TABLETS BY MOUTH EVERY DAY 180 tablet 3  . tiZANidine (ZANAFLEX) 4 MG tablet Take 1 tablet (4 mg total) by mouth every 8 (eight) hours as needed for muscle spasms. 90 tablet 3   No current facility-administered medications for this visit.    Allergies:   Codeine; Moxifloxacin; Penicillins; Pramipexole dihydrochloride; and Ropinirole hydrochloride    Social History:  The patient  reports that she has never smoked. She has never used smokeless tobacco. She reports that she does not drink alcohol or use illicit drugs.   Family History:  The patient's family history includes Alzheimer's disease in her mother and sister; Cancer in her father; Hypertension in her mother; Parkinsonism in her mother. There is no history of Colon cancer.    ROS:  Please see the history of present illness.   Otherwise, review of systems are positive for none.   All other  systems are reviewed and negative.    PHYSICAL EXAM: VS:  There were no vitals taken for this visit. , BMI There is no weight on file to calculate BMI. GEN: Well nourished, well developed, in no acute distress HEENT: normal Neck: no JVD, carotid bruits, or masses Cardiac:  RRR; no murmurs, rubs, or gallops,no edema  Respiratory:  clear to auscultation bilaterally, normal work of breathing GI: soft, nontender, nondistended, + BS MS: no deformity or atrophy Skin: warm and dry, no rash Neuro:  Strength and sensation are intact Psych: euthymic mood, full affect   EKG:  SR rate 65 normal 12/20/14     Recent Labs: 11/08/2014: ALT 20; BUN 19; Creatinine 0.7; Hemoglobin 13.0; Platelets 250.0; Potassium 4.0; Sodium 135; TSH 3.23  Lipid Panel    Component Value Date/Time   CHOL 191 11/08/2014 1635   TRIG 63.0 11/08/2014 1635   HDL 51.20 11/08/2014 1635   CHOLHDL 4 11/08/2014 1635   VLDL 12.6 11/08/2014 1635   LDLCALC 127* 11/08/2014 1635   LDLDIRECT 158.1 01/02/2011 1122      Wt Readings from Last 3 Encounters:  12/20/14 186 lb (84.369 kg)  11/08/14 197 lb 6 oz (89.529 kg)  03/23/14 180 lb (81.647 kg)      Other studies Reviewed: Additional studies/ records that were reviewed today include: Holter and echo records from Dr Jenny Reichmann see details in HPI.    ASSESSMENT AND PLAN:  1.  Labile BP:  I think the low readings at night are from muscle relaxant and gabapentin She has normal ECG, echo and exam No evidence of cardiac issues  Also no  Evidence of other autonomic dysfunction POTS or other neurologic issue.  Stop these two meds at night and see how she does.  Can titrate up norvasc to max of 10mg  In am Will consider 24 hr BP monitor if she continues to have issues.     Current medicines are reviewed at length with the patient today.  The patient does not have concerns regarding medicines.  The following changes have been made:  Stop meds as above   Labs/ tests ordered  today include: None  No orders of the defined types were placed in this encounter.     Disposition:   FU with PRN i    Signed, Jenkins Rouge, MD  02/09/2015 10:54 AM    [facility] [place], [place], [place]  [address] Phone: [phone]; Fax: [phone]

## 2015-02-09 NOTE — Patient Instructions (Signed)
Your physician recommends that you schedule a follow-up appointment in: AS NEEDED  Your physician recommends that you continue on your current medications as directed. Please refer to the Current Medication list given to you today.  

## 2015-02-16 ENCOUNTER — Ambulatory Visit (INDEPENDENT_AMBULATORY_CARE_PROVIDER_SITE_OTHER): Payer: Medicare Other | Admitting: Internal Medicine

## 2015-02-16 ENCOUNTER — Encounter: Payer: Self-pay | Admitting: Internal Medicine

## 2015-02-16 ENCOUNTER — Telehealth: Payer: Self-pay | Admitting: Internal Medicine

## 2015-02-16 VITALS — BP 132/80 | HR 63 | Temp 98.1°F | Resp 18 | Ht 65.0 in | Wt 185.0 lb

## 2015-02-16 DIAGNOSIS — R7302 Impaired glucose tolerance (oral): Secondary | ICD-10-CM | POA: Diagnosis not present

## 2015-02-16 DIAGNOSIS — I519 Heart disease, unspecified: Secondary | ICD-10-CM | POA: Diagnosis not present

## 2015-02-16 DIAGNOSIS — I5189 Other ill-defined heart diseases: Secondary | ICD-10-CM

## 2015-02-16 DIAGNOSIS — I10 Essential (primary) hypertension: Secondary | ICD-10-CM

## 2015-02-16 NOTE — Assessment & Plan Note (Signed)
Somewhat labile, pt without documented low BP off the muscle relaxer and gabapentin, and asympt, BP stable, today, .stable overall by history and exam, , and pt to continue medical treatment as before,  to f/u any worsening symptoms or concerns BP Readings from Last 3 Encounters:  02/16/15 132/80  02/09/15 172/78  12/20/14 162/80

## 2015-02-16 NOTE — Progress Notes (Signed)
Pre visit review using our clinic review tool, if applicable. No additional management support is needed unless otherwise documented below in the visit note. 

## 2015-02-16 NOTE — Telephone Encounter (Signed)
emmi emailed °

## 2015-02-16 NOTE — Assessment & Plan Note (Signed)
Asympt, stable overall by history and exam, recent data reviewed with pt, and pt to continue medical treatment as before,  to f/u any worsening symptoms or concerns 

## 2015-02-16 NOTE — Assessment & Plan Note (Signed)
Gr 2 by echo, no recent worsening volume, cont meds as is

## 2015-02-16 NOTE — Patient Instructions (Signed)
Please continue all other medications as before, and refills have been done if requested.  Please have the pharmacy call with any other refills you may need.  Please keep your appointments with your specialists as you may have planned    

## 2015-02-16 NOTE — Progress Notes (Signed)
Subjective:    Patient ID: [name], female    DOB: 1936/01/27, 79 y.o.   MRN: [id number]  HPI  Here to f/u; overall doing ok,  Pt denies chest pain, increasing sob or doe, wheezing, orthopnea, PND, increased LE swelling, palpitations, dizziness or syncope.  Pt denies new neurological symptoms such as new headache, or facial or extremity weakness or numbness.  Pt denies polydipsia, polyuria, or low sugar episode.   Pt denies new neurological symptoms such as new headache, or facial or extremity weakness or numbness.   Pt states overall good compliance with meds, mostly trying to follow appropriate diet, with wt overall stable,  but little exercise however.  Has felt BP more stable off the muscle relaxer and gabapentin after seeing Dr Johnsie Cancel last wk BP Readings from Last 3 Encounters:  02/16/15 132/80  02/09/15 172/78  12/20/14 162/80   Past Medical History  Diagnosis Date  . HYPERLIPIDEMIA 06/22/2008  . DEPRESSION 06/13/2007  . Chalazion 10/30/2007  . HYPERTENSION 10/30/2007  . GERD 06/13/2007  . DIVERTICULOSIS, COLON 06/22/2008  . [place] DISEASE, LUMBAR 06/21/2009  . SCIATICA, LEFT 12/28/2008  . OSTEOPENIA 06/13/2007  . FATIGUE 06/21/2009  . PARESTHESIA, HANDS 10/30/2007  . DVT, HX OF 06/13/2007  . Preventative health care 03/31/2011  . Impaired glucose tolerance 04/02/2012  . Restless leg   . Allergy   . Glaucoma    Past Surgical History  Procedure Laterality Date  . Tubal ligation  1970  . Knee arthroscopy Left 2002  . Breast lumpectomy Left April 2008  . S/p right cataract   Feb. 2012  . Back surgery  2011  . Cervical fusion  2011  . Foot surgery Right 1998    reports that she has never smoked. She has never used smokeless tobacco. She reports that she does not drink alcohol or use illicit drugs. family history includes Alzheimer's disease in her mother and sister; Cancer in her father; Hypertension in her mother; Parkinsonism in her mother. There is no history of Colon  cancer. Allergies  Allergen Reactions  . Codeine     REACTION: nausea  . Moxifloxacin     Yeast infection  . Penicillins     Yeast infection  . Pramipexole Dihydrochloride     Per pt: unknown  . Ropinirole Hydrochloride     Per pt: unknown   Current Outpatient Prescriptions on File Prior to Visit  Medication Sig Dispense Refill  . acetaminophen (TYLENOL) 500 MG tablet Take 500 mg by mouth every 6 (six) hours as needed.    . ALPRAZolam (XANAX) 0.5 MG tablet Take 0.5 mg by mouth daily.  0  . amLODipine (NORVASC) 2.5 MG tablet Take 1 tablet (2.5 mg total) by mouth daily. 90 tablet 3  . ANUCORT-HC 25 MG suppository UNWRAP AND INSERT 1 SUPPOSITORY RECTALLY TWICE DAILY AS DIRECTED 12 suppository 1  . aspirin 81 MG EC tablet Take 81 mg by mouth daily.      . clobetasol cream (TEMOVATE) 0.05 % Apply topically 2 (two) times daily. Apply topically. 30 g 1  . CRANBERRY FRUIT PO Take by mouth daily.    Marland Kitchen docusate sodium (COLACE) 100 MG capsule Take 100 mg by mouth daily.    Marland Kitchen EPIPEN 2-PAK 0.3 MG/0.3ML SOAJ injection   12  . estradiol (ESTRACE) 0.1 MG/GM vaginal cream Place 1 Applicatorful vaginally 2 (two) times a week.    . fexofenadine (ALLEGRA) 180 MG tablet Take 180 mg by mouth daily.      Marland Kitchen  fluconazole (DIFLUCAN) 100 MG tablet 1 by mouth once daily for 3 days as needed 12 tablet 2  . gabapentin (NEURONTIN) 300 MG capsule Take 2 capsules (600 mg total) by mouth 3 (three) times daily. 540 capsule 1  . gentamicin ointment (GARAMYCIN) 0.1 % Apply 1 application topically 3 (three) times daily. Apply ointment to nose TID  15  . Lactobacillus-Inulin ([place]) CHEW Chew by mouth once.    . latanoprost (XALATAN) 0.005 % ophthalmic solution Place 1 drop into both eyes at bedtime.      . Multiple Vitamins-Minerals (MULTIVITAMIN PO) Take by mouth daily.    . nitrofurantoin, macrocrystal-monohydrate, (MACROBID) 100 MG capsule Take 100 mg by mouth as needed. For bladder infection  3   . nystatin cream (MYCOSTATIN) Apply 1 application topically as needed for dry skin.    . Omega-3 Fatty Acids (FISH OIL) 1000 MG CAPS Take by mouth daily.      . pravastatin (PRAVACHOL) 40 MG tablet Take 1 tablet (40 mg total) by mouth daily. 90 tablet 3  . PROCTOZONE-HC 2.5 % rectal cream PLACE RECTALLY TWICE DAILY AS DIRECTED 30 g 2  . sertraline (ZOLOFT) 100 MG tablet TAKE 2 TABLETS BY MOUTH EVERY DAY 180 tablet 3   No current facility-administered medications on file prior to visit.     Review of Systems  Constitutional: Negative for unusual diaphoresis or night sweats HENT: Negative for ringing in ear or discharge Eyes: Negative for double vision or worsening visual disturbance.  Respiratory: Negative for choking and stridor.   Gastrointestinal: Negative for vomiting or other signifcant bowel change Genitourinary: Negative for hematuria or change in urine volume.  Musculoskeletal: Negative for other MSK pain or swelling Skin: Negative for color change and worsening wound.  Neurological: Negative for tremors and numbness other than noted  Psychiatric/Behavioral: Negative for decreased concentration or agitation other than above       Objective:   Physical Exam BP 132/80 mmHg  Pulse 63  Temp(Src) 98.1 F (36.7 C) (Oral)  Resp 18  Ht 5\' 5"  (1.651 m)  Wt 185 lb (83.915 kg)  BMI 30.79 kg/m2  SpO2 96% VS noted,  Constitutional: Pt appears in no significant distress HENT: Head: NCAT.  Right Ear: External ear normal.  Left Ear: External ear normal.  Eyes: . Pupils are equal, round, and reactive to light. Conjunctivae and EOM are normal Neck: Normal range of motion. Neck supple.  Cardiovascular: Normal rate and regular rhythm.   Pulmonary/Chest: Effort normal and breath sounds without rales or wheezing.  Abd:  Soft, NT, ND, + BS Neurological: Pt is alert. Not confused , motor grossly intact Skin: Skin is warm. No rash, no LE edema except trace right pedal Psychiatric: Pt  behavior is normal. No agitation.  .    Assessment & Plan:

## 2015-03-12 NOTE — Op Note (Signed)
PATIENT NAME:  [name], [name] MR#:  [id number] DATE OF BIRTH:  [birth year]  DATE OF PROCEDURE:  12/17/2011  PREOPERATIVE DIAGNOSIS:  Senile cataract left eye.  POSTOPERATIVE DIAGNOSIS:  Senile cataract left eye.  PROCEDURE:  Phacoemulsification with posterior chamber intraocular lens implantation of the left eye.  LENS:  SN60WF 18.5-diopter posterior chamber intraocular lens.  ULTRASOUND TIME:  10% of 1 minute, 2 seconds. CDE 6.5.3  SURGEON:  Mali Zelena Bushong, MD  ANESTHESIA:  Topical with tetracaine drops and 2% Xylocaine jelly.  COMPLICATIONS:  None.  DESCRIPTION OF PROCEDURE:  The patient was identified in the holding room and transported to the operating room and placed in the supine position under the operating microscope.  The left eye was identified as the operative eye and it was prepped and draped in the usual sterile ophthalmic fashion.  A 1 millimeter clear-corneal paracentesis was made at the 1:30 position.  The anterior chamber was filled with Viscoat viscoelastic.  A 2.4 millimeter keratome was used to make a near-clear corneal incision at the 10:30 position.  A curvilinear capsulorrhexis was made with a cystotome and capsulorrhexis forceps.  Balanced salt solution was used to hydrodissect and hydrodelineate the nucleus.  Phacoemulsification was then used in horizontal chopping fashion to remove the lens nucleus and epinucleus.  The remaining cortex was then removed using the irrigation and aspiration handpiece. Provisc was then placed into the capsular bag to distend it for lens placement.  An SN60WF 18.5-diopter lens was then injected into the capsular bag.  The remaining viscoelastic was aspirated.  Wounds were hydrated with balanced salt solution.  The anterior chamber was inflated to a physiologic pressure with balanced salt solution.  Miostat was placed into the anterior chamber to constrict the pupil.  No wound leaks were noted.  Topical Vigamox drops and Maxitrol ointment  were applied to the eye.  The patient was taken to the recovery room in stable condition without complications of anesthesia or surgery.  ____________________________ Wyonia Hough, MD crb:cms D: 12/17/2011 10:17:22 ET T: 12/17/2011 10:47:33 ET JOB#: [id number]  cc: Wyonia Hough, MD, <Dictator> Leandrew Koyanagi MD ELECTRONICALLY SIGNED 12/17/2011 10:55

## 2015-03-24 ENCOUNTER — Other Ambulatory Visit: Payer: Self-pay | Admitting: Internal Medicine

## 2015-05-11 ENCOUNTER — Other Ambulatory Visit: Payer: Self-pay | Admitting: Internal Medicine

## 2015-05-11 NOTE — Telephone Encounter (Signed)
Done hardcopy to steph 

## 2015-05-12 ENCOUNTER — Other Ambulatory Visit: Payer: Self-pay | Admitting: Internal Medicine

## 2015-05-12 NOTE — Telephone Encounter (Signed)
rx faxed

## 2015-05-15 ENCOUNTER — Encounter: Payer: Self-pay | Admitting: Internal Medicine

## 2015-06-28 ENCOUNTER — Other Ambulatory Visit: Payer: Self-pay | Admitting: Internal Medicine

## 2015-09-11 ENCOUNTER — Encounter: Payer: Self-pay | Admitting: Internal Medicine

## 2015-09-21 ENCOUNTER — Other Ambulatory Visit: Payer: Self-pay | Admitting: Internal Medicine

## 2015-09-28 ENCOUNTER — Other Ambulatory Visit: Payer: Self-pay | Admitting: Internal Medicine

## 2015-09-28 NOTE — Telephone Encounter (Signed)
Please advise, thanks.

## 2015-10-02 ENCOUNTER — Ambulatory Visit: Payer: Self-pay | Admitting: Podiatry

## 2015-10-04 ENCOUNTER — Ambulatory Visit (INDEPENDENT_AMBULATORY_CARE_PROVIDER_SITE_OTHER): Payer: Medicare Other

## 2015-10-04 ENCOUNTER — Encounter: Payer: Self-pay | Admitting: Podiatry

## 2015-10-04 ENCOUNTER — Ambulatory Visit (INDEPENDENT_AMBULATORY_CARE_PROVIDER_SITE_OTHER): Payer: Medicare Other | Admitting: Podiatry

## 2015-10-04 VITALS — BP 126/52 | HR 65 | Resp 18

## 2015-10-04 DIAGNOSIS — R52 Pain, unspecified: Secondary | ICD-10-CM | POA: Diagnosis not present

## 2015-10-04 DIAGNOSIS — G5761 Lesion of plantar nerve, right lower limb: Secondary | ICD-10-CM | POA: Diagnosis not present

## 2015-10-04 NOTE — Progress Notes (Signed)
Presents today chief complaint of pain to the third interspace of the right foot. Since been going on for quite some time.  Objective: Vital signs are stable she is alert and oriented 3 pulses are strongly palpable. Neurologic and sensorium is intact her Semmes-Weinstein monofilament. She does have a palpable Mulder's click to the third interdigital space of the right foot. Hammertoe deformities are also noted.  Assessment: Neuroma third interdigital space right foot.  Plan: Injected Kenalog to the point of muscle tenderness third of theright foot. Follow up with her in 1 month if needed. We discussed appropriate shoe gear stretching exercises ice therapy and shoe gear modifications.

## 2015-10-09 ENCOUNTER — Other Ambulatory Visit: Payer: Self-pay | Admitting: Internal Medicine

## 2015-10-10 NOTE — Telephone Encounter (Signed)
Done hardcopy to Dahlia  

## 2015-10-10 NOTE — Telephone Encounter (Signed)
Rx faxed to pharmacy  

## 2015-10-16 ENCOUNTER — Other Ambulatory Visit: Payer: Self-pay | Admitting: Internal Medicine

## 2015-10-17 NOTE — Telephone Encounter (Signed)
Xanax already done nov 22

## 2015-10-18 ENCOUNTER — Other Ambulatory Visit: Payer: Self-pay | Admitting: Internal Medicine

## 2015-11-01 ENCOUNTER — Encounter: Payer: Self-pay | Admitting: Podiatry

## 2015-11-01 ENCOUNTER — Ambulatory Visit (INDEPENDENT_AMBULATORY_CARE_PROVIDER_SITE_OTHER): Payer: Medicare Other | Admitting: Podiatry

## 2015-11-01 VITALS — BP 139/66 | HR 65 | Resp 18

## 2015-11-01 DIAGNOSIS — G5761 Lesion of plantar nerve, right lower limb: Secondary | ICD-10-CM | POA: Diagnosis not present

## 2015-11-01 NOTE — Progress Notes (Signed)
She presents today for follow-up of a neuroma which we injected last visit third interdigital space of the right foot. She states that is doing so much better. She states that I had a little bit of pain on the left foot but it has since resolved.  Objective: Vital signs are stable she is alert and oriented 3. Pulses are strongly palpable. Neurologic sensorium is intact per Semmes-Weinstein monofilament. Deep tendon reflexes are intact. She has minimal pain on palpation of the third interdigital space bilaterally today much decreased from previous evaluation.  Assessment: Well-healing neuroma third interdigital space bilateral.  Plan: Follow up with her as needed and we discussed the possible need for alcohol injections.

## 2015-11-21 ENCOUNTER — Encounter: Payer: Medicare Other | Admitting: Internal Medicine

## 2015-11-28 ENCOUNTER — Encounter: Payer: Medicare Other | Admitting: Internal Medicine

## 2015-12-10 ENCOUNTER — Other Ambulatory Visit: Payer: Self-pay | Admitting: Internal Medicine

## 2015-12-14 ENCOUNTER — Telehealth: Payer: Self-pay

## 2015-12-14 MED ORDER — ALPRAZOLAM 0.25 MG PO TABS: 0.2500 mg | ORAL_TABLET | Freq: Two times a day (BID) | ORAL | Status: DC | PRN

## 2015-12-14 NOTE — Telephone Encounter (Signed)
Done hardcopy to Corinne  

## 2015-12-14 NOTE — Addendum Note (Signed)
Addended by: Biagio Borg on: 12/14/2015 07:02 PM   Modules accepted: Orders

## 2015-12-14 NOTE — Telephone Encounter (Signed)
Patients pharmacy sent over refill request for Alprazolam 0.25mg  Tablet. Please advise.

## 2015-12-15 NOTE — Telephone Encounter (Signed)
Medication printed signed and faxed

## 2015-12-20 ENCOUNTER — Ambulatory Visit (INDEPENDENT_AMBULATORY_CARE_PROVIDER_SITE_OTHER): Payer: Medicare Other | Admitting: Internal Medicine

## 2015-12-20 ENCOUNTER — Encounter: Payer: Self-pay | Admitting: Internal Medicine

## 2015-12-20 ENCOUNTER — Other Ambulatory Visit: Payer: Self-pay

## 2015-12-20 ENCOUNTER — Other Ambulatory Visit (INDEPENDENT_AMBULATORY_CARE_PROVIDER_SITE_OTHER): Payer: Medicare Other

## 2015-12-20 VITALS — BP 128/82 | HR 60 | Temp 98.3°F | Resp 20 | Ht 65.0 in | Wt 189.0 lb

## 2015-12-20 DIAGNOSIS — Z0000 Encounter for general adult medical examination without abnormal findings: Secondary | ICD-10-CM | POA: Diagnosis not present

## 2015-12-20 DIAGNOSIS — R1031 Right lower quadrant pain: Secondary | ICD-10-CM | POA: Insufficient documentation

## 2015-12-20 DIAGNOSIS — R7302 Impaired glucose tolerance (oral): Secondary | ICD-10-CM

## 2015-12-20 DIAGNOSIS — R1030 Lower abdominal pain, unspecified: Secondary | ICD-10-CM | POA: Diagnosis not present

## 2015-12-20 DIAGNOSIS — I10 Essential (primary) hypertension: Secondary | ICD-10-CM | POA: Diagnosis not present

## 2015-12-20 DIAGNOSIS — E785 Hyperlipidemia, unspecified: Secondary | ICD-10-CM | POA: Diagnosis not present

## 2015-12-20 DIAGNOSIS — M25561 Pain in right knee: Secondary | ICD-10-CM

## 2015-12-20 DIAGNOSIS — R1032 Left lower quadrant pain: Secondary | ICD-10-CM

## 2015-12-20 DIAGNOSIS — M25562 Pain in left knee: Secondary | ICD-10-CM

## 2015-12-20 LAB — URINALYSIS, ROUTINE W REFLEX MICROSCOPIC
Bilirubin Urine: NEGATIVE
Hgb urine dipstick: NEGATIVE
KETONES UR: NEGATIVE
Leukocytes, UA: NEGATIVE
NITRITE: NEGATIVE
PH: 6 (ref 5.0–8.0)
SPECIFIC GRAVITY, URINE: 1.015 (ref 1.000–1.030)
Total Protein, Urine: NEGATIVE
Urine Glucose: NEGATIVE
Urobilinogen, UA: 0.2 (ref 0.0–1.0)

## 2015-12-20 LAB — BASIC METABOLIC PANEL WITH GFR
BUN: 14 mg/dL (ref 6–23)
CO2: 29 meq/L (ref 19–32)
Calcium: 9.6 mg/dL (ref 8.4–10.5)
Chloride: 102 meq/L (ref 96–112)
Creatinine, Ser: 0.7 mg/dL (ref 0.40–1.20)
GFR: 86.13 mL/min
Glucose, Bld: 79 mg/dL (ref 70–99)
Potassium: 4.1 meq/L (ref 3.5–5.1)
Sodium: 138 meq/L (ref 135–145)

## 2015-12-20 LAB — HEPATIC FUNCTION PANEL
ALT: 17 U/L (ref 0–35)
AST: 20 U/L (ref 0–37)
Albumin: 4.3 g/dL (ref 3.5–5.2)
Alkaline Phosphatase: 65 U/L (ref 39–117)
BILIRUBIN DIRECT: 0.1 mg/dL (ref 0.0–0.3)
TOTAL PROTEIN: 8 g/dL (ref 6.0–8.3)
Total Bilirubin: 0.5 mg/dL (ref 0.2–1.2)

## 2015-12-20 LAB — TSH: TSH: 3.38 u[IU]/mL (ref 0.35–4.50)

## 2015-12-20 LAB — LIPID PANEL
Cholesterol: 167 mg/dL (ref 0–200)
HDL: 52.6 mg/dL (ref >39.00)
LDL Cholesterol: 98 mg/dL (ref 0–99)
NONHDL: 114.68
TRIGLYCERIDES: 82 mg/dL (ref 0.0–149.0)
Total CHOL/HDL Ratio: 3
VLDL: 16.4 mg/dL (ref 0.0–40.0)

## 2015-12-20 LAB — HEMOGLOBIN A1C: Hgb A1c MFr Bld: 5.6 % (ref 4.6–6.5)

## 2015-12-20 MED ORDER — ALPRAZOLAM 0.25 MG PO TABS: 0.2500 mg | ORAL_TABLET | Freq: Two times a day (BID) | ORAL | Status: DC | PRN

## 2015-12-20 MED ORDER — AMLODIPINE BESYLATE 2.5 MG PO TABS: 2.5000 mg | ORAL_TABLET | Freq: Every day | ORAL | Status: DC

## 2015-12-20 MED ORDER — GENTAMICIN SULFATE 0.1 % EX OINT: 1.0000 "application " | TOPICAL_OINTMENT | Freq: Three times a day (TID) | CUTANEOUS | Status: DC

## 2015-12-20 MED ORDER — GABAPENTIN 300 MG PO CAPS: ORAL_CAPSULE | ORAL | Status: DC

## 2015-12-20 MED ORDER — PRAVASTATIN SODIUM 40 MG PO TABS: 40.0000 mg | ORAL_TABLET | Freq: Every day | ORAL | Status: DC

## 2015-12-20 MED ORDER — SERTRALINE HCL 100 MG PO TABS: ORAL_TABLET | ORAL | Status: DC

## 2015-12-20 MED ORDER — FLUCONAZOLE 100 MG PO TABS: ORAL_TABLET | ORAL | Status: DC

## 2015-12-20 NOTE — Progress Notes (Signed)
Subjective:    Patient ID: [name], female    DOB: 1936/06/20, 80 y.o.   MRN: [id number]  HPI  Here for wellness and f/u;  Overall doing ok;  Pt denies Chest pain, worsening SOB, DOE, wheezing, orthopnea, PND, worsening LE edema, palpitations, dizziness or syncope.  Pt denies neurological change such as new headache, facial or extremity weakness.  Pt denies polydipsia, polyuria, or low sugar symptoms. Pt states overall good compliance with treatment and medications, good tolerability, and has been trying to follow appropriate diet.  Pt denies worsening depressive symptoms, suicidal ideation or panic. No fever, night sweats, wt loss, loss of appetite, or other constitutional symptoms.  Pt states good ability with ADL's, has low fall risk, home safety reviewed and adequate, no other significant changes in hearing or vision, and only occasionally active with exercise, due to ongoing mild to mod but persistent bilat groin/hip pain and bilat knee pain. Wt up 5 lbs Wt Readings from Last 3 Encounters:  12/20/15 189 lb (85.73 kg)  02/16/15 185 lb (83.915 kg)  02/09/15 184 lb 12.8 oz (83.825 kg)   Past Medical History  Diagnosis Date  . HYPERLIPIDEMIA 06/22/2008  . DEPRESSION 06/13/2007  . Chalazion 10/30/2007  . HYPERTENSION 10/30/2007  . GERD 06/13/2007  . DIVERTICULOSIS, COLON 06/22/2008  . [place] DISEASE, LUMBAR 06/21/2009  . SCIATICA, LEFT 12/28/2008  . OSTEOPENIA 06/13/2007  . FATIGUE 06/21/2009  . PARESTHESIA, HANDS 10/30/2007  . DVT, HX OF 06/13/2007  . Preventative health care 03/31/2011  . Impaired glucose tolerance 04/02/2012  . Restless leg   . Allergy   . Glaucoma    Past Surgical History  Procedure Laterality Date  . Tubal ligation  1970  . Knee arthroscopy Left 2002  . Breast lumpectomy Left April 2008  . S/p right cataract   Feb. 2012  . Back surgery  2011  . Cervical fusion  2011  . Foot surgery Right 1998    reports that she has never smoked. She has never used smokeless  tobacco. She reports that she does not drink alcohol or use illicit drugs. family history includes Alzheimer's disease in her mother and sister; Cancer in her father; Hypertension in her mother; Parkinsonism in her mother. There is no history of Colon cancer. Allergies  Allergen Reactions  . Codeine     REACTION: nausea  . Moxifloxacin     Yeast infection  . Penicillins     Yeast infection  . Pramipexole Dihydrochloride     Per pt: unknown  . Ropinirole Hydrochloride     Per pt: unknown   Current Outpatient Prescriptions on File Prior to Visit  Medication Sig Dispense Refill  . acetaminophen (TYLENOL) 500 MG tablet Take 500 mg by mouth every 6 (six) hours as needed.    . ALPRAZolam (XANAX) 0.25 MG tablet Take 1 tablet (0.25 mg total) by mouth 2 (two) times daily as needed. 60 tablet 1  . amLODipine (NORVASC) 2.5 MG tablet TAKE 1 TABLET BY MOUTH EVERY DAY 90 tablet 0  . ANUCORT-HC 25 MG suppository UNWRAP AND INSERT 1 SUPPOSITORY RECTALLY TWICE DAILY AS DIRECTED 12 suppository 0  . aspirin 81 MG EC tablet Take 81 mg by mouth daily.      . clobetasol cream (TEMOVATE) 0.05 % Apply topically 2 (two) times daily. Apply topically. 30 g 1  . CRANBERRY FRUIT PO Take by mouth daily.    Marland Kitchen docusate sodium (COLACE) 100 MG capsule Take 100 mg by mouth daily.    Marland Kitchen  EPIPEN 2-PAK 0.3 MG/0.3ML SOAJ injection   12  . estradiol (ESTRACE) 0.1 MG/GM vaginal cream Place 1 Applicatorful vaginally 2 (two) times a week.    . fexofenadine (ALLEGRA) 180 MG tablet Take 180 mg by mouth daily.      . fluconazole (DIFLUCAN) 100 MG tablet 1 by mouth once daily for 3 days as needed 12 tablet 2  . gabapentin (NEURONTIN) 300 MG capsule TAKE 2 CAPSULE BY MOUTH THREE TIMES DAILY 540 capsule 1  . gentamicin ointment (GARAMYCIN) 0.1 % Apply 1 application topically 3 (three) times daily. Apply ointment to nose TID  15  . Lactobacillus-Inulin ([place]) CHEW Chew by mouth once.    . latanoprost (XALATAN)  0.005 % ophthalmic solution Place 1 drop into both eyes at bedtime.      . Multiple Vitamins-Minerals (MULTIVITAMIN PO) Take by mouth daily.    . nitrofurantoin, macrocrystal-monohydrate, (MACROBID) 100 MG capsule Take 100 mg by mouth as needed. For bladder infection  3  . nystatin cream (MYCOSTATIN) Apply 1 application topically as needed for dry skin.    . Omega-3 Fatty Acids (FISH OIL) 1000 MG CAPS Take by mouth daily.      . pravastatin (PRAVACHOL) 40 MG tablet Take 1 tablet (40 mg total) by mouth daily. 90 tablet 0  . PROCTOZONE-HC 2.5 % rectal cream APPLY CREAM RECTALLY TWICE DAILY AS DIRECTED 30 g 0  . sertraline (ZOLOFT) 100 MG tablet TAKE 2 TABLETS BY MOUTH EVERY DAY 180 tablet 3   No current facility-administered medications on file prior to visit.   Review of Systems Constitutional: Negative for increased diaphoresis, other activity, appetite or siginficant weight change other than noted HENT: Negative for worsening hearing loss, ear pain, facial swelling, mouth sores and neck stiffness.   Eyes: Negative for other worsening pain, redness or visual disturbance.  Respiratory: Negative for shortness of breath and wheezing  Cardiovascular: Negative for chest pain and palpitations.  Gastrointestinal: Negative for diarrhea, blood in stool, abdominal distention or other pain Genitourinary: Negative for hematuria, flank pain or change in urine volume.  Musculoskeletal: Negative for myalgias or other joint complaints.  Skin: Negative for color change and wound or drainage.  Neurological: Negative for syncope and numbness. other than noted Hematological: Negative for adenopathy. or other swelling Psychiatric/Behavioral: Negative for hallucinations, SI, self-injury, decreased concentration or other worsening agitation.       Objective:   Physical Exam BP 128/82 mmHg  Pulse 60  Temp(Src) 98.3 F (36.8 C) (Oral)  Resp 20  Ht 5\' 5"  (1.651 m)  Wt 189 lb (85.73 kg)  BMI 31.45 kg/m2   SpO2 94% VS noted, no ill appearing Constitutional: Pt is oriented to person, place, and time. Appears well-developed and well-nourished, in no significant distress Head: Normocephalic and atraumatic.  Right Ear: External ear normal.  Left Ear: External ear normal.  Nose: Nose normal.  Mouth/Throat: Oropharynx is clear and moist.  Eyes: Conjunctivae and EOM are normal. Pupils are equal, round, and reactive to light.  Neck: Normal range of motion. Neck supple. No JVD present. No tracheal deviation present or significant neck LA or mass Cardiovascular: Normal rate, regular rhythm, normal heart sounds and intact distal pulses.   Pulmonary/Chest: Effort normal and breath sounds without rales or wheezing  Abdominal: Soft. Bowel sounds are normal. NT. No HSM  Musculoskeletal: Normal range of motion. Exhibits no edema.  Lymphadenopathy:  Has no cervical adenopathy.  Neurological: Pt is alert and oriented to person, place, and time. Pt  has normal reflexes. No cranial nerve deficit. Motor grossly intact Bilat hips with mild pain on flexion with reduced ROM Bilat knees without tender or swelling, FROM but crepitus bilat Skin: Skin is warm and dry. No rash noted.  Psychiatric:  Has normal mood and affect. Behavior is normal.     Assessment & Plan:

## 2015-12-20 NOTE — Assessment & Plan Note (Signed)

## 2015-12-20 NOTE — Assessment & Plan Note (Signed)
stable overall by history and exam, recent data reviewed with pt, and pt to continue medical treatment as before,  to f/u any worsening symptoms or concerns BP Readings from Last 3 Encounters:  12/20/15 128/82  11/01/15 139/66  10/04/15 126/52

## 2015-12-20 NOTE — Assessment & Plan Note (Signed)
stable overall by history and exam, recent data reviewed with pt, and pt to continue medical treatment as before,  to f/u any worsening symptoms or concerns Lab Results  Component Value Date   HGBA1C 6.0 11/08/2014    

## 2015-12-20 NOTE — Assessment & Plan Note (Signed)
likelyt djd related, to cont tylenol, topical pain med, also refer Sport med for ? Cortisone as is activity limiting, consider ortho eval, will hold on imaging for now

## 2015-12-20 NOTE — Patient Instructions (Signed)
Your EKG was OK today  Please continue all other medications as before, and refills have been done if requested.  Please have the pharmacy call with any other refills you may need.  Please continue your efforts at being more active, low cholesterol diet, and weight control.  You are otherwise up to date with prevention measures today.  Please keep your appointments with your specialists as you may have planned  You will be contacted regarding the referral for: Dr Tamala Julian - sports medicine  Please go to the LAB in the Basement (turn left off the elevator) for the tests to be done today  You will be contacted by phone if any changes need to be made immediately.  Otherwise, you will receive a letter about your results with an explanation, but please check with MyChart first.  Please remember to sign up for MyChart if you have not done so, as this will be important to you in the future with finding out test results, communicating by private email, and scheduling acute appointments online when needed.  Please return in 1 year for your yearly visit, or sooner if needed, with Lab testing done 3-5 days before

## 2015-12-20 NOTE — Progress Notes (Signed)
Pre visit review using our clinic review tool, if applicable. No additional management support is needed unless otherwise documented below in the visit note. 

## 2015-12-20 NOTE — Assessment & Plan Note (Signed)
I suspect likely related to bilat hip DJD, also activity limiting and unable to walk outside for exercise, ok for tylenol prn, also refer Sport med as well

## 2015-12-22 ENCOUNTER — Other Ambulatory Visit: Payer: Self-pay | Admitting: Obstetrics and Gynecology

## 2015-12-22 DIAGNOSIS — R928 Other abnormal and inconclusive findings on diagnostic imaging of breast: Secondary | ICD-10-CM

## 2015-12-27 ENCOUNTER — Ambulatory Visit
Admission: RE | Admit: 2015-12-27 | Discharge: 2015-12-27 | Disposition: A | Discharge status: Home / Self Care | Payer: Medicare Other | Source: Ambulatory Visit | Attending: Obstetrics and Gynecology | Admitting: Obstetrics and Gynecology

## 2015-12-27 DIAGNOSIS — R928 Other abnormal and inconclusive findings on diagnostic imaging of breast: Secondary | ICD-10-CM

## 2015-12-31 ENCOUNTER — Encounter: Payer: Self-pay | Admitting: Family Medicine

## 2016-01-01 ENCOUNTER — Ambulatory Visit: Payer: Medicare Other | Admitting: Family Medicine

## 2016-01-12 ENCOUNTER — Ambulatory Visit: Payer: Medicare Other | Admitting: Family Medicine

## 2016-01-22 ENCOUNTER — Other Ambulatory Visit: Payer: Self-pay | Admitting: Internal Medicine

## 2016-01-24 ENCOUNTER — Encounter: Payer: Self-pay | Admitting: Family Medicine

## 2016-01-24 ENCOUNTER — Other Ambulatory Visit (INDEPENDENT_AMBULATORY_CARE_PROVIDER_SITE_OTHER): Payer: Medicare Other

## 2016-01-24 ENCOUNTER — Ambulatory Visit (INDEPENDENT_AMBULATORY_CARE_PROVIDER_SITE_OTHER): Payer: Medicare Other | Admitting: Family Medicine

## 2016-01-24 ENCOUNTER — Ambulatory Visit (INDEPENDENT_AMBULATORY_CARE_PROVIDER_SITE_OTHER)
Admission: RE | Admit: 2016-01-24 | Discharge: 2016-01-24 | Disposition: A | Discharge status: Home / Self Care | Payer: Medicare Other | Source: Ambulatory Visit | Attending: Family Medicine | Admitting: Family Medicine

## 2016-01-24 VITALS — BP 130/74 | HR 68 | Ht 65.0 in | Wt 189.0 lb

## 2016-01-24 DIAGNOSIS — M170 Bilateral primary osteoarthritis of knee: Secondary | ICD-10-CM

## 2016-01-24 DIAGNOSIS — M25561 Pain in right knee: Secondary | ICD-10-CM

## 2016-01-24 NOTE — Progress Notes (Signed)
Pre visit review using our clinic review tool, if applicable. No additional management support is needed unless otherwise documented below in the visit note. 

## 2016-01-24 NOTE — Progress Notes (Signed)
[facility] [address], [place] [address] Phone: [phone] Subjective:    I'm seeing this patient by the request  of:  Cathlean Cower, MD   CC: bilateral knee pain  [id number] [name] is a 80 y.o. female coming in with complaint of bilateral knee pain. Complaint of right greater than left knee pain. Patient states that any activity more than her regular daily activities can be painful. Going up and down stairs become very difficult. Patient did see another provider who had suggested possible surgical intervention. Patient has been given 3 injection last greater than 6 months ago.patient states though that it is even given her some trouble at night. Sometimes has some mild instability.denies any radiation down the leg. Denies any numbness. Rates the severity pain is 6 out of 10.     Past Medical History  Diagnosis Date  . HYPERLIPIDEMIA 06/22/2008  . DEPRESSION 06/13/2007  . Chalazion 10/30/2007  . HYPERTENSION 10/30/2007  . GERD 06/13/2007  . DIVERTICULOSIS, COLON 06/22/2008  . [place] DISEASE, LUMBAR 06/21/2009  . SCIATICA, LEFT 12/28/2008  . OSTEOPENIA 06/13/2007  . FATIGUE 06/21/2009  . PARESTHESIA, HANDS 10/30/2007  . DVT, HX OF 06/13/2007  . Preventative health care 03/31/2011  . Impaired glucose tolerance 04/02/2012  . Restless leg   . Allergy   . Glaucoma    Past Surgical History  Procedure Laterality Date  . Tubal ligation  1970  . Knee arthroscopy Left 2002  . Breast lumpectomy Left April 2008  . S/p right cataract   Feb. 2012  . Back surgery  2011  . Cervical fusion  2011  . Foot surgery Right 1998   Social History   Social History  . Marital Status: Married    Spouse Name: N/A  . Number of Children: 2  . Years of Education: N/A   Occupational History  . retired Building control surveyor    Social History Main Topics  . Smoking status: Never Smoker   . Smokeless tobacco: Never Used  . Alcohol Use: No  . Drug Use: No  .  Sexual Activity: Not Asked   Other Topics Concern  . None   Social History Narrative   Allergies  Allergen Reactions  . Codeine     REACTION: nausea  . Moxifloxacin     Yeast infection  . Penicillins     Yeast infection  . Pramipexole Dihydrochloride     Per pt: unknown  . Ropinirole Hydrochloride     Per pt: unknown   Family History  Problem Relation Age of Onset  . Hypertension Mother   . Alzheimer's disease Mother   . Parkinsonism Mother   . Cancer Father     prostate cancer  . Alzheimer's disease Sister   . Colon cancer Neg Hx     Past medical history, social, surgical and family history all reviewed in electronic medical record.  No pertanent information unless stated regarding to the chief complaint.   Review of Systems: No headache, visual changes, nausea, vomiting, diarrhea, constipation, dizziness, abdominal pain, skin rash, fevers, chills, night sweats, weight loss, swollen lymph nodes, body aches, joint swelling, muscle aches, chest pain, shortness of breath, mood changes.   Objective Blood pressure 130/74, pulse 68, height 5\' 5"  (1.651 m), weight 189 lb (85.73 kg), SpO2 94 %.  General: No apparent distress alert and oriented x3 mood and affect normal, dressed appropriately.  HEENT: Pupils equal, extraocular movements intact  Respiratory: Patient's speak in full  sentences and does not appear short of breath  Cardiovascular: No lower extremity edema, non tender, no erythema  Skin: Warm dry intact with no signs of infection or rash on extremities or on axial skeleton.  Abdomen: Soft nontender  Neuro: Cranial nerves II through XII are intact, neurovascularly intact in all extremities with 2+ DTRs and 2+ pulses.  Lymph: No lymphadenopathy of posterior or anterior cervical chain or axillae bilaterally.  Gait normal with good balance and coordination.  MSK:  Non tender with full range of motion and good stability and symmetric strength and tone of shoulders, elbows,  wrist, hip, and ankles bilaterally.  Knee:bilateral Severe varus deformity of the knees bilaterallylarge thigh to calf ratio Tender to palpation of the medial and lateral joint lines bilaterally right greater than left. Patient also has a lipoma overlying the right anterior lateral joint lines Lacks last 5 of flexion bilaterally Instability noted with valgus force bilaterally Negative Mcmurray's, Apley's, and Thessalonian tests.  painful patellar compression. Patellar glide mild to moderatecrepitus. Patellar and quadriceps tendons unremarkable. Hamstring and quadriceps strength is normal.   MSK US performed of: right knee This study was ordered, performed, and interpreted by Charlann Boxer D.O.  Knee: All structures visualized. Moderate narrowing of the medial and lateral joint lines bilaterally right greater than leftt. Patellar Tendon unremarkable on long and transverse views without effusion. No abnormality of prepatellar bursa. LCL and MCL unremarkable on long and transverse views. No abnormality of origin of medial or lateral head of the gastrocnemius.  IMPRESSION:  Moderate to severe arthritis of the knee.  After informed written and verbal consent, patient was seated on exam table. Right knee was prepped with alcohol swab and utilizing anterolateral approach, patient's right knee space was injected with 4:1  marcaine 0.5%: Kenalog 40mg /dL. Patient tolerated the procedure well without immediate complications.   Impression and Recommendations:     This case required medical decision making of moderate complexity.      Note: This dictation was prepared with Dragon dictation along with smaller phrase technology. Any transcriptional errors that result from this process are unintentional.

## 2016-01-24 NOTE — Assessment & Plan Note (Addendum)
Patient was given an injection of the right knee today. Hopefully this will be beneficial for patient. We discussed over-the-counter medications.  Prescription for topical anti-inflammatorieswas given. We discussed icing regimen. We discussed proper shoes. Patient had a make these different changes and come back in 4 weeks. Patient could be a candidate for viscous supplementation with her failing all other conservative therapy previously. X-rays are pending.

## 2016-01-24 NOTE — Patient Instructions (Signed)
Good to see you.  Ice 20 minutes 2 times daily. Usually after activity and before bed. Stay active Take tylenol 500 mg three times a day is the best evidence based medicine we have for arthritis.  Vitamin D 2000 IU daily Fish oil 2 grams daily.  Tumeric 500mg  twice daily.  pennsaid pinkie amount topically 2 times daily as needed.   Cortisone injections are an option if these interventions do not seem to make a difference or need more relief.  If cortisone injections do not help, there are different types of shots that may help but they take longer to take effect.  We can discuss this at follow up.  It's important that you continue to stay active. Controlling your weight is important.  Shoe inserts with good arch support may be helpful.  [place] orthotics at [employer] [facility] could help.  Water aerobics and cycling with low resistance are the best two types of exercise for arthritis. Come back and see me in 4 weeks.

## 2016-01-25 ENCOUNTER — Other Ambulatory Visit: Payer: Self-pay | Admitting: Internal Medicine

## 2016-02-21 ENCOUNTER — Ambulatory Visit: Payer: Medicare Other | Admitting: Family Medicine

## 2016-05-05 ENCOUNTER — Other Ambulatory Visit: Payer: Self-pay | Admitting: Internal Medicine

## 2016-05-06 NOTE — Telephone Encounter (Signed)
Faxed script back to [facility].../lmb 

## 2016-05-06 NOTE — Telephone Encounter (Signed)
MD is out of office until Wed 05/08/16 pls advise on refill...[name]

## 2016-05-20 ENCOUNTER — Ambulatory Visit (INDEPENDENT_AMBULATORY_CARE_PROVIDER_SITE_OTHER): Payer: Medicare Other | Admitting: Vascular Surgery

## 2016-05-20 ENCOUNTER — Encounter: Payer: Self-pay | Admitting: Vascular Surgery

## 2016-05-20 ENCOUNTER — Ambulatory Visit (HOSPITAL_COMMUNITY)
Admission: RE | Admit: 2016-05-20 | Discharge: 2016-05-20 | Disposition: A | Discharge status: Home / Self Care | Payer: Medicare Other | Source: Ambulatory Visit | Attending: Vascular Surgery | Admitting: Vascular Surgery

## 2016-05-20 ENCOUNTER — Other Ambulatory Visit: Payer: Self-pay | Admitting: *Deleted

## 2016-05-20 VITALS — BP 128/73 | HR 61 | Temp 97.3°F | Resp 14 | Ht 65.5 in | Wt 179.0 lb

## 2016-05-20 DIAGNOSIS — I83891 Varicose veins of right lower extremities with other complications: Secondary | ICD-10-CM | POA: Diagnosis not present

## 2016-05-20 DIAGNOSIS — I83811 Varicose veins of right lower extremities with pain: Secondary | ICD-10-CM

## 2016-05-20 NOTE — Progress Notes (Signed)
Subjective:     Patient ID: [name], female   DOB: Feb 26, 1936, 80 y.o.   MRN: [id number]  HPI this 80 year old female is a self-referral for painful varicosities right leg. She describes pain in both knees right worse than left. She has previously had injections in her left knee but not on the right. She has had treatment of varicose veins over the past 10 years in [place]. She has had laser treatment but she is not certain which veins were treated. She is also had sclerotherapy on multiple occasions in [place]. She denies any distal edema. She does not elastic compression stockings. She has no history of DVT or thrombophlebitis stasis ulcers or bleeding.  Past Medical History  Diagnosis Date  . HYPERLIPIDEMIA 06/22/2008  . DEPRESSION 06/13/2007  . Chalazion 10/30/2007  . HYPERTENSION 10/30/2007  . GERD 06/13/2007  . DIVERTICULOSIS, COLON 06/22/2008  . [place] DISEASE, LUMBAR 06/21/2009  . SCIATICA, LEFT 12/28/2008  . OSTEOPENIA 06/13/2007  . FATIGUE 06/21/2009  . PARESTHESIA, HANDS 10/30/2007  . DVT, HX OF 06/13/2007  . Preventative health care 03/31/2011  . Impaired glucose tolerance 04/02/2012  . Restless leg   . Allergy   . Glaucoma     Social History  Substance Use Topics  . Smoking status: Never Smoker   . Smokeless tobacco: Never Used  . Alcohol Use: No    Family History  Problem Relation Age of Onset  . Hypertension Mother   . Alzheimer's disease Mother   . Parkinsonism Mother   . Cancer Father     prostate cancer  . Alzheimer's disease Sister   . Colon cancer Neg Hx     Allergies  Allergen Reactions  . Codeine     REACTION: nausea  . Moxifloxacin     Yeast infection  . Penicillins     Yeast infection  . Pramipexole Dihydrochloride     Per pt: unknown  . Ropinirole Hydrochloride     Per pt: unknown     Current outpatient prescriptions:  .  acetaminophen (TYLENOL) 500 MG tablet, Take 500 mg by mouth every 6 (six) hours as needed., Disp: , Rfl:  .   ALPRAZolam (XANAX) 0.25 MG tablet, TAKE 1 TABLET BY MOUTH TWICE DAILY AS NEEDED, Disp: 60 tablet, Rfl: 0 .  amLODipine (NORVASC) 2.5 MG tablet, Take 1 tablet (2.5 mg total) by mouth daily., Disp: 90 tablet, Rfl: 0 .  ANUCORT-HC 25 MG suppository, UNWRAP AND INSERT 1 SUPPOSITORY RECTALLY TWICE DAILY AS DIRECTED, Disp: 12 suppository, Rfl: 0 .  clobetasol cream (TEMOVATE) 0.05 %, Apply topically 2 (two) times daily. Apply topically., Disp: 30 g, Rfl: 1 .  CRANBERRY FRUIT PO, Take by mouth daily., Disp: , Rfl:  .  docusate sodium (COLACE) 100 MG capsule, Take 100 mg by mouth daily., Disp: , Rfl:  .  EPIPEN 2-PAK 0.3 MG/0.3ML SOAJ injection, , Disp: , Rfl: 12 .  estradiol (ESTRACE) 0.1 MG/GM vaginal cream, Place 1 Applicatorful vaginally 2 (two) times a week., Disp: , Rfl:  .  fexofenadine (ALLEGRA) 180 MG tablet, Take 180 mg by mouth daily.  , Disp: , Rfl:  .  fluconazole (DIFLUCAN) 100 MG tablet, 1 by mouth once daily for 3 days as needed, Disp: 12 tablet, Rfl: 2 .  gabapentin (NEURONTIN) 300 MG capsule, TAKE 2 CAPSULE BY MOUTH THREE TIMES DAILY, Disp: 540 capsule, Rfl: 1 .  gentamicin ointment (GARAMYCIN) 0.1 %, Apply 1 application topically 3 (three) times daily. Apply ointment to nose TID, Disp:  15 g, Rfl: 15 .  Lactobacillus-Inulin ([place]) CHEW, Chew by mouth once., Disp: , Rfl:  .  latanoprost (XALATAN) 0.005 % ophthalmic solution, Place 1 drop into both eyes at bedtime.  , Disp: , Rfl:  .  Multiple Vitamins-Minerals (MULTIVITAMIN PO), Take by mouth daily., Disp: , Rfl:  .  nitrofurantoin, macrocrystal-monohydrate, (MACROBID) 100 MG capsule, Take 100 mg by mouth as needed. For bladder infection, Disp: , Rfl: 3 .  nystatin cream (MYCOSTATIN), Apply 1 application topically as needed for dry skin., Disp: , Rfl:  .  Omega-3 Fatty Acids (FISH OIL) 1000 MG CAPS, Take by mouth daily.  , Disp: , Rfl:  .  pravastatin (PRAVACHOL) 40 MG tablet, Take 1 tablet (40 mg total) by mouth  daily., Disp: 90 tablet, Rfl: 0 .  PROCTOZONE-HC 2.5 % rectal cream, APPLY CREAM RECTALLY TWICE DAILY AS DIRECTED, Disp: 30 g, Rfl: 0 .  sertraline (ZOLOFT) 100 MG tablet, TAKE 2 TABLETS BY MOUTH EVERY DAY, Disp: 180 tablet, Rfl: 3 .  aspirin 81 MG EC tablet, Take 81 mg by mouth daily. Reported on 05/20/2016, Disp: , Rfl:   Filed Vitals:   05/20/16 1426  BP: 128/73  Pulse: 61  Temp: 97.3 F (36.3 C)  Resp: 14  Height: 5' 5.5" (1.664 m)  Weight: 179 lb (81.194 kg)  SpO2: 97%    Body mass index is 29.32 kg/(m^2).           Review of Systems denies chest pain, dyspnea on exertion, PND, orthopnea, hemoptysis. Complains of significant bilateral knee discomfort right worse than left other systems negative and complete review of systems.    Objective:   Physical Exam BP 128/73 mmHg  Pulse 61  Temp(Src) 97.3 F (36.3 C)  Resp 14  Ht 5' 5.5" (1.664 m)  Wt 179 lb (81.194 kg)  BMI 29.32 kg/m2  SpO2 97%    Gen.-alert and oriented x3 in no apparent distress HEENT normal for age Lungs no rhonchi or wheezing Cardiovascular regular rhythm no murmurs carotid pulses 3+ palpable no bruits audible Abdomen soft nontender no palpable masses Musculoskeletal free of  major deformities Skin clear -no rashes Neurologic normal Lower extremities 3+ femoral and dorsalis pedis pulses palpable bilaterally with no edema Both legs with scattered spider and reticular veins right worse than left. Mostly these are concentrated in the lower third of the leg. No hyperpigmentation or ulceration noted. A few small varicosities in the right posterior calf. No large bulging varicosities in either lower extremity. No hyperpigmentation.  I ordered a venous duplex exam the right leg which I reviewed and interpreted. There is no DVT. The right great saphenous vein is absent. The right small saphenous vein is tortuous with some reflux.       Assessment:     Bilateral lower extremity pain mostly due to  arthritis bilateral knees right worse than left History of laser ablation most likely in right leg-great saphenous and [place] 2-3 years ago and also history of sclerotherapy right leg and [place] No indication for any treatment at this time other than foam sclerotherapy of small varicosities right calf if patient should decide she would like to proceed with this    Plan:     Patient will get bilateral knees evaluated for arthritic pain and return to see [place] on when necessary basis

## 2016-05-24 ENCOUNTER — Encounter: Payer: Medicare Other | Admitting: Vascular Surgery

## 2016-05-24 ENCOUNTER — Encounter (HOSPITAL_COMMUNITY): Payer: Medicare Other

## 2016-05-30 ENCOUNTER — Encounter: Payer: Self-pay | Admitting: Family Medicine

## 2016-05-30 ENCOUNTER — Ambulatory Visit (INDEPENDENT_AMBULATORY_CARE_PROVIDER_SITE_OTHER): Payer: Medicare Other | Admitting: Family Medicine

## 2016-05-30 VITALS — BP 120/70 | HR 60 | Ht 65.0 in | Wt 182.0 lb

## 2016-05-30 DIAGNOSIS — M170 Bilateral primary osteoarthritis of knee: Secondary | ICD-10-CM | POA: Diagnosis not present

## 2016-05-30 MED ORDER — MELOXICAM 7.5 MG PO TABS: 7.5000 mg | ORAL_TABLET | Freq: Every day | ORAL | Status: DC

## 2016-05-30 NOTE — Assessment & Plan Note (Signed)
Patient is having worsening symptoms. Patient will be fitted for a stability brace. I think will be beneficial. We discussed icing regimen. Oral anti-inflammatory prescribed. Follow-up in 4 weeks. She would be a candidate for viscous supplementation. Otherwise he can continue with corticosteroid injections every 3-4 months.

## 2016-05-30 NOTE — Progress Notes (Signed)
[facility] [address], [place] [address] Phone: [phone] Subjective:    I'm seeing this patient by the request  of:  Cathlean Cower, MD   CC: Right knee pain follow-up  [id number] [name] is a 80 y.o. female coming in with complaint of right knee pain. Patient does have bilateral moderate to severe degenerative joint disease. Patient was given an injection in the right knee 4 months ago. Was doing significantly better for 3 months but over the last month has started having worsening pain again. Rates the severity pain is 8 out of 10. Having increasing instability. Starting to affect daily activities and she is avoiding certain activities.     Past Medical History  Diagnosis Date  . HYPERLIPIDEMIA 06/22/2008  . DEPRESSION 06/13/2007  . Chalazion 10/30/2007  . HYPERTENSION 10/30/2007  . GERD 06/13/2007  . DIVERTICULOSIS, COLON 06/22/2008  . [place] DISEASE, LUMBAR 06/21/2009  . SCIATICA, LEFT 12/28/2008  . OSTEOPENIA 06/13/2007  . FATIGUE 06/21/2009  . PARESTHESIA, HANDS 10/30/2007  . DVT, HX OF 06/13/2007  . Preventative health care 03/31/2011  . Impaired glucose tolerance 04/02/2012  . Restless leg   . Allergy   . Glaucoma    Past Surgical History  Procedure Laterality Date  . Tubal ligation  1970  . Knee arthroscopy Left 2002  . Breast lumpectomy Left April 2008  . S/p right cataract   Feb. 2012  . Back surgery  2011  . Cervical fusion  2011  . Foot surgery Right 1998   Social History   Social History  . Marital Status: Married    Spouse Name: N/A  . Number of Children: 2  . Years of Education: N/A   Occupational History  . retired Building control surveyor    Social History Main Topics  . Smoking status: Never Smoker   . Smokeless tobacco: Never Used  . Alcohol Use: No  . Drug Use: No  . Sexual Activity: Not Asked   Other Topics Concern  . None   Social History Narrative   Allergies  Allergen Reactions  . Codeine    REACTION: nausea  . Moxifloxacin     Yeast infection  . Penicillins     Yeast infection  . Pramipexole Dihydrochloride     Per pt: unknown  . Ropinirole Hydrochloride     Per pt: unknown   Family History  Problem Relation Age of Onset  . Hypertension Mother   . Alzheimer's disease Mother   . Parkinsonism Mother   . Cancer Father     prostate cancer  . Alzheimer's disease Sister   . Colon cancer Neg Hx     Past medical history, social, surgical and family history all reviewed in electronic medical record.  No pertanent information unless stated regarding to the chief complaint.   Review of Systems: No headache, visual changes, nausea, vomiting, diarrhea, constipation, dizziness, abdominal pain, skin rash, fevers, chills, night sweats, weight loss, swollen lymph nodes, body aches, joint swelling, muscle aches, chest pain, shortness of breath, mood changes.   Objective Blood pressure 120/70, pulse 60, height 5\' 5"  (1.651 m), weight 182 lb (82.555 kg), SpO2 92 %.  General: No apparent distress alert and oriented x3 mood and affect normal, dressed appropriately.  HEENT: Pupils equal, extraocular movements intact  Respiratory: Patient's speak in full sentences and does not appear short of breath  Cardiovascular: No lower extremity edema, non tender, no erythema  Skin: Warm dry intact with no  signs of infection or rash on extremities or on axial skeleton.  Abdomen: Soft nontender  Neuro: Cranial nerves II through XII are intact, neurovascularly intact in all extremities with 2+ DTRs and 2+ pulses.  Lymph: No lymphadenopathy of posterior or anterior cervical chain or axillae bilaterally.  Gait normal with good balance and coordination.  MSK:  Non tender with full range of motion and good stability and symmetric strength and tone of shoulders, elbows, wrist, hip, and ankles bilaterally.  Knee:bilateral Severe varus deformity of the knees bilaterally large thigh to calf ratio Tender to  palpation of the medial and lateral joint lines bilaterally right greater than left. Patient also has a lipoma overlying the right anterior lateral joint lines Near full range of motion Instability noted with valgus force bilaterally Negative Mcmurray's, Apley's, and Thessalonian tests.  painful patellar compression. Patellar glide mild to moderatecrepitus. Patellar and quadriceps tendons unremarkable. Hamstring and quadriceps strength is normal.  Contralateral knee mild laterally tender as well.   After informed written and verbal consent, patient was seated on exam table. Right knee was prepped with alcohol swab and utilizing anterolateral approach, patient's right knee space was injected with 4:1  marcaine 0.5%: Kenalog 40mg /dL. Patient tolerated the procedure well without immediate complications.   Impression and Recommendations:     This case required medical decision making of moderate complexity.      Note: This dictation was prepared with Dragon dictation along with smaller phrase technology. Any transcriptional errors that result from this process are unintentional.

## 2016-05-30 NOTE — Patient Instructions (Signed)
Good to see you  Ice is your friend Try the meloxicam daily as needed for severe pain  They will call you on the brace If injection works we can do every 3 months.  If not then come back in 4 weeks and we can start the other injections.

## 2016-06-17 ENCOUNTER — Other Ambulatory Visit: Payer: Self-pay | Admitting: Internal Medicine

## 2016-06-24 ENCOUNTER — Other Ambulatory Visit: Payer: Self-pay | Admitting: Internal Medicine

## 2016-06-24 NOTE — Telephone Encounter (Signed)
Please advise, thanks.

## 2016-06-25 ENCOUNTER — Telehealth: Payer: Self-pay

## 2016-06-25 NOTE — Telephone Encounter (Signed)
Patient called and said that she needs the number to the guy that came into the office to fit her for a knee brace. She states he told her once she got the brace she needed to call him. But he never gave her his number to call. She ask to talk to Nankin about this. So maybe you can help her. Thank you.

## 2016-06-25 NOTE — Telephone Encounter (Signed)
Done hardcopy to Corinne  

## 2016-06-25 NOTE — Telephone Encounter (Signed)
Medication refill sent to pharmacy  

## 2016-06-25 NOTE — Telephone Encounter (Signed)
Spoke with pt & advised her matt will be calling her to schedule a time to meet.

## 2016-06-27 ENCOUNTER — Encounter: Payer: Self-pay | Admitting: Internal Medicine

## 2016-06-27 ENCOUNTER — Ambulatory Visit (INDEPENDENT_AMBULATORY_CARE_PROVIDER_SITE_OTHER): Payer: Medicare Other | Admitting: Internal Medicine

## 2016-06-27 ENCOUNTER — Ambulatory Visit (INDEPENDENT_AMBULATORY_CARE_PROVIDER_SITE_OTHER): Payer: Medicare Other | Admitting: Family Medicine

## 2016-06-27 VITALS — BP 122/76 | HR 60 | Temp 98.0°F | Resp 20 | Wt 182.0 lb

## 2016-06-27 DIAGNOSIS — I10 Essential (primary) hypertension: Secondary | ICD-10-CM

## 2016-06-27 DIAGNOSIS — R6889 Other general symptoms and signs: Secondary | ICD-10-CM

## 2016-06-27 DIAGNOSIS — Z0001 Encounter for general adult medical examination with abnormal findings: Secondary | ICD-10-CM

## 2016-06-27 DIAGNOSIS — R7302 Impaired glucose tolerance (oral): Secondary | ICD-10-CM | POA: Diagnosis not present

## 2016-06-27 DIAGNOSIS — M170 Bilateral primary osteoarthritis of knee: Secondary | ICD-10-CM

## 2016-06-27 DIAGNOSIS — E785 Hyperlipidemia, unspecified: Secondary | ICD-10-CM

## 2016-06-27 NOTE — Patient Instructions (Signed)
Please continue all other medications as before, and refills have been done if requested.  Please have the pharmacy call with any other refills you may need.  Please continue your efforts at being more active, low cholesterol diet, and weight control.  You are otherwise up to date with prevention measures today.  Please keep your appointments with your specialists as you may have planned  Please return in 6 months, or sooner if needed, with Lab testing done 3-5 days before  

## 2016-06-27 NOTE — Patient Instructions (Signed)
trd monovisc today, may take a week or 10 days Ice is yoru friend Call [facility] and they will get you set up for the brace See me again in 4 weeks to make sure you are making improvement.

## 2016-06-27 NOTE — Progress Notes (Signed)
Pre visit review using our clinic review tool, if applicable. No additional management support is needed unless otherwise documented below in the visit note. 

## 2016-06-27 NOTE — Assessment & Plan Note (Addendum)
Patient given injection today and tolerated the procedure well. Patient will be fitted for custom brace that I think also be beneficial. We discussed at length about her as well as dance imaging. Patient wanted to have this done and we discussed with the amount of arthritis she had likely her only surgical intervention would be knee replacement. Patient wants to avoid that at this time and we'll continue with conservative therapy. Patient come back again in 4 weeks for further evaluation. Cannot repeat viscous supple mentation for 6 months. Spent  25 minutes with patient face-to-face and had greater than 50% of counseling including as described above in assessment and plan.

## 2016-06-27 NOTE — Progress Notes (Signed)
Subjective:    Patient ID: [name], female    DOB: Aug 03, 1936, 80 y.o.   MRN: [id number]  HPI  Here to f/u; overall doing ok,  Pt denies chest pain, increasing sob or doe, wheezing, orthopnea, PND, increased LE swelling, palpitations, dizziness or syncope.  Pt denies new neurological symptoms such as new headache, or facial or extremity weakness or numbness.  Pt denies polydipsia, polyuria, or low sugar episode.   Pt denies new neurological symptoms such as new headache, or facial or extremity weakness or numbness.   Pt states overall good compliance with meds, mostly trying to follow appropriate diet, with wt overall stable,  but little exercise however. Past Medical History:  Diagnosis Date  . Allergy   . Chalazion 10/30/2007  . DEPRESSION 06/13/2007  . [place] DISEASE, LUMBAR 06/21/2009  . DIVERTICULOSIS, COLON 06/22/2008  . DVT, HX OF 06/13/2007  . FATIGUE 06/21/2009  . GERD 06/13/2007  . Glaucoma   . HYPERLIPIDEMIA 06/22/2008  . HYPERTENSION 10/30/2007  . Impaired glucose tolerance 04/02/2012  . OSTEOPENIA 06/13/2007  . PARESTHESIA, HANDS 10/30/2007  . Preventative health care 03/31/2011  . Restless leg   . SCIATICA, LEFT 12/28/2008   Past Surgical History:  Procedure Laterality Date  . BACK SURGERY  2011  . BREAST LUMPECTOMY Left April 2008  . CERVICAL FUSION  2011  . FOOT SURGERY Right 1998  . KNEE ARTHROSCOPY Left 2002  . s/p right cataract   Feb. 2012  . TUBAL LIGATION  1970    reports that she has never smoked. She has never used smokeless tobacco. She reports that she does not drink alcohol or use drugs. family history includes Alzheimer's disease in her mother and sister; Cancer in her father; Hypertension in her mother; Parkinsonism in her mother. Allergies  Allergen Reactions  . Codeine     REACTION: nausea  . Moxifloxacin     Yeast infection  . Penicillins     Yeast infection  . Pramipexole Dihydrochloride     Per pt: unknown  . Ropinirole Hydrochloride     Per pt:  unknown   Current Outpatient Prescriptions on File Prior to Visit  Medication Sig Dispense Refill  . acetaminophen (TYLENOL) 500 MG tablet Take 500 mg by mouth every 6 (six) hours as needed.    . ALPRAZolam (XANAX) 0.25 MG tablet TAKE 1 TABLET BY MOUTH TWICE DAILY AS NEEDED 60 tablet 2  . amLODipine (NORVASC) 2.5 MG tablet TAKE 1 TABLET(2.5 MG) BY MOUTH DAILY 90 tablet 0  . ANUCORT-HC 25 MG suppository UNWRAP AND INSERT 1 SUPPOSITORY RECTALLY TWICE DAILY AS DIRECTED 12 suppository 0  . aspirin 81 MG EC tablet Take 81 mg by mouth daily. Reported on 05/20/2016    . clobetasol cream (TEMOVATE) 0.05 % Apply topically 2 (two) times daily. Apply topically. 30 g 1  . CRANBERRY FRUIT PO Take by mouth daily.    Marland Kitchen docusate sodium (COLACE) 100 MG capsule Take 100 mg by mouth daily.    Marland Kitchen EPIPEN 2-PAK 0.3 MG/0.3ML SOAJ injection   12  . estradiol (ESTRACE) 0.1 MG/GM vaginal cream Place 1 Applicatorful vaginally 2 (two) times a week.    . fexofenadine (ALLEGRA) 180 MG tablet Take 180 mg by mouth daily.      . fluconazole (DIFLUCAN) 100 MG tablet 1 by mouth once daily for 3 days as needed 12 tablet 2  . gabapentin (NEURONTIN) 300 MG capsule TAKE 2 CAPSULE BY MOUTH THREE TIMES DAILY 540 capsule 1  .  gentamicin ointment (GARAMYCIN) 0.1 % Apply 1 application topically 3 (three) times daily. Apply ointment to nose TID 15 g 15  . Lactobacillus-Inulin ([place]) CHEW Chew by mouth once.    . latanoprost (XALATAN) 0.005 % ophthalmic solution Place 1 drop into both eyes at bedtime.      . meloxicam (MOBIC) 7.5 MG tablet Take 1 tablet (7.5 mg total) by mouth daily. 30 tablet 0  . Multiple Vitamins-Minerals (MULTIVITAMIN PO) Take by mouth daily.    . nitrofurantoin, macrocrystal-monohydrate, (MACROBID) 100 MG capsule Take 100 mg by mouth as needed. For bladder infection  3  . nystatin cream (MYCOSTATIN) Apply 1 application topically as needed for dry skin.    . Omega-3 Fatty Acids (FISH OIL) 1000 MG  CAPS Take by mouth daily.      . pravastatin (PRAVACHOL) 40 MG tablet TAKE 1 TABLET(40 MG) BY MOUTH DAILY 90 tablet 0  . PROCTOZONE-HC 2.5 % rectal cream APPLY CREAM RECTALLY TWICE DAILY AS DIRECTED 30 g 0  . sertraline (ZOLOFT) 100 MG tablet TAKE 2 TABLETS BY MOUTH EVERY DAY 180 tablet 3   No current facility-administered medications on file prior to visit.    Review of Systems  Constitutional: Negative for unusual diaphoresis or night sweats HENT: Negative for ear swelling or discharge Eyes: Negative for worsening visual haziness  Respiratory: Negative for choking and stridor.   Gastrointestinal: Negative for distension or worsening eructation Genitourinary: Negative for retention or change in urine volume.  Musculoskeletal: Negative for other MSK pain or swelling Skin: Negative for color change and worsening wound Neurological: Negative for tremors and numbness other than noted  Psychiatric/Behavioral: Negative for decreased concentration or agitation other than above       Objective:   Physical Exam BP 122/76   Pulse 60   Temp 98 F (36.7 C) (Oral)   Resp 20   Wt 182 lb (82.6 kg)   SpO2 95%   BMI 30.29 kg/m  VS noted,  Constitutional: Pt appears in no apparent distress HENT: Head: NCAT.  Right Ear: External ear normal.  Left Ear: External ear normal.  Eyes: . Pupils are equal, round, and reactive to light. Conjunctivae and EOM are normal Neck: Normal range of motion. Neck supple.  Cardiovascular: Normal rate and regular rhythm.   Pulmonary/Chest: Effort normal and breath sounds without rales or wheezing.  Abd:  Soft, NT, ND, + BS Neurological: Pt is alert. Not confused , motor grossly intact Skin: Skin is warm. No rash, no LE edema Psychiatric: Pt behavior is normal. No agitation.   Lab Results  Component Value Date   WBC 8.5 11/08/2014   HGB 13.0 11/08/2014   HCT 39.4 11/08/2014   PLT 250.0 11/08/2014   GLUCOSE 79 12/20/2015   CHOL 167 12/20/2015   TRIG 82.0  12/20/2015   HDL 52.60 12/20/2015   LDLDIRECT 158.1 01/02/2011   LDLCALC 98 12/20/2015   ALT 17 12/20/2015   AST 20 12/20/2015   NA 138 12/20/2015   K 4.1 12/20/2015   CL 102 12/20/2015   CREATININE 0.70 12/20/2015   BUN 14 12/20/2015   CO2 29 12/20/2015   TSH 3.38 12/20/2015   HGBA1C 5.6 12/20/2015       Assessment & Plan:

## 2016-06-27 NOTE — Progress Notes (Signed)
[facility] [address] [place],  [address] Phone: [phone] Subjective:    I'm seeing this patient by the request  of:  Cathlean Cower, MD   CC: Right knee pain follow-up  [id number]  [name] is a 80 y.o. female coming in with complaint of right knee pain. Patient does have bilateral moderate to severe degenerative joint disease.  Patient given a right knee injection 4 weeks ago. States that it worked for proximal nail week and is having worsening pain. Patient states that the pain is severe enough that stops her from activity. Patient states going up and down stairs can give her difficulty and is having instability. Patient is being fitted for a different brace. Patient was to avoid any type of surgical intervention at this time..     Past Medical History:  Diagnosis Date  . Allergy   . Chalazion 10/30/2007  . DEPRESSION 06/13/2007  . [place] DISEASE, LUMBAR 06/21/2009  . DIVERTICULOSIS, COLON 06/22/2008  . DVT, HX OF 06/13/2007  . FATIGUE 06/21/2009  . GERD 06/13/2007  . Glaucoma   . HYPERLIPIDEMIA 06/22/2008  . HYPERTENSION 10/30/2007  . Impaired glucose tolerance 04/02/2012  . OSTEOPENIA 06/13/2007  . PARESTHESIA, HANDS 10/30/2007  . Preventative health care 03/31/2011  . Restless leg   . SCIATICA, LEFT 12/28/2008   Past Surgical History:  Procedure Laterality Date  . BACK SURGERY  2011  . BREAST LUMPECTOMY Left April 2008  . CERVICAL FUSION  2011  . FOOT SURGERY Right 1998  . KNEE ARTHROSCOPY Left 2002  . s/p right cataract   Feb. 2012  . TUBAL LIGATION  1970   Social History   Social History  . Marital status: Married    Spouse name: N/A  . Number of children: 2  . Years of education: N/A   Occupational History  . retired Building control surveyor Retired   Social History Main Topics  . Smoking status: Never Smoker  . Smokeless tobacco: Never Used  . Alcohol use No  . Drug use: No  . Sexual activity: Not on file   Other  Topics Concern  . Not on file   Social History Narrative  . No narrative on file   Allergies  Allergen Reactions  . Codeine     REACTION: nausea  . Moxifloxacin     Yeast infection  . Penicillins     Yeast infection  . Pramipexole Dihydrochloride     Per pt: unknown  . Ropinirole Hydrochloride     Per pt: unknown   Family History  Problem Relation Age of Onset  . Hypertension Mother   . Alzheimer's disease Mother   . Parkinsonism Mother   . Cancer Father     prostate cancer  . Alzheimer's disease Sister   . Colon cancer Neg Hx     Past medical history, social, surgical and family history all reviewed in electronic medical record.  No pertanent information unless stated regarding to the chief complaint.   Review of Systems: No headache, visual changes, nausea, vomiting, diarrhea, constipation, dizziness, abdominal pain, skin rash, fevers, chills, night sweats, weight loss, swollen lymph nodes, body aches, joint swelling, muscle aches, chest pain, shortness of breath, mood changes.   Objective  There were no vitals taken for this visit.  General: No apparent distress alert and oriented x3 mood and affect normal, dressed appropriately.  HEENT: Pupils equal, extraocular movements intact  Respiratory: Patient's speak in full sentences and does not appear  short of breath  Cardiovascular: No lower extremity edema, non tender, no erythema  Skin: Warm dry intact with no signs of infection or rash on extremities or on axial skeleton.  Abdomen: Soft nontender  Neuro: Cranial nerves II through XII are intact, neurovascularly intact in all extremities with 2+ DTRs and 2+ pulses.  Lymph: No lymphadenopathy of posterior or anterior cervical chain or axillae bilaterally.  Gait normal with good balance and coordination.  MSK:  Non tender with full range of motion and good stability and symmetric strength and tone of shoulders, elbows, wrist, hip, and ankles bilaterally.   Knee:bilateral Severe varus deformity of the knees bilaterally large thigh to calf ratio Tender to palpation of the medial and lateral joint lines bilaterally right greater than left. Patient also has a lipoma overlying the right anterior lateral joint lines Near full range of motion Instability noted with valgus force bilaterally Negative Mcmurray's, Apley's, and Thessalonian tests.  painful patellar compression. Patellar glide mild to moderatecrepitus. Patellar and quadriceps tendons unremarkable. Hamstring and quadriceps strength is normal.  Contralateral knee mild laterally tender as well.   After informed written and verbal consent, patient was seated on exam table. Right knee was prepped with alcohol swab and utilizing anterolateral approach, patient's right knee space was injected with 25 mg/1 mL of Monovisc (sodium hyaluronate) in a prefilled syringe was injected easily into the knee through a 22-gauge needle.. Patient tolerated the procedure well without immediate complications.   Impression and Recommendations:     This case required medical decision making of moderate complexity.      Note: This dictation was prepared with Dragon dictation along with smaller phrase technology. Any transcriptional errors that result from this process are unintentional.

## 2016-06-28 ENCOUNTER — Other Ambulatory Visit: Payer: Self-pay | Admitting: Family Medicine

## 2016-06-30 NOTE — Assessment & Plan Note (Signed)
stable overall by history and exam, recent data reviewed with pt, and pt to continue medical treatment as before,  to f/u any worsening symptoms or concerns Lab Results  Component Value Date   LDLCALC 98 12/20/2015

## 2016-06-30 NOTE — Assessment & Plan Note (Signed)
stable overall by history and exam, recent data reviewed with pt, and pt to continue medical treatment as before,  to f/u any worsening symptoms or concerns Lab Results  Component Value Date   HGBA1C 5.6 12/20/2015   

## 2016-06-30 NOTE — Assessment & Plan Note (Signed)
stable overall by history and exam, recent data reviewed with pt, and pt to continue medical treatment as before,  to f/u any worsening symptoms or concerns BP Readings from Last 3 Encounters:  06/27/16 122/76  05/30/16 120/70  05/20/16 128/73

## 2016-07-01 NOTE — Telephone Encounter (Signed)
Refill done.  

## 2016-07-29 ENCOUNTER — Other Ambulatory Visit: Payer: Self-pay | Admitting: Family Medicine

## 2016-07-30 NOTE — Telephone Encounter (Signed)
Refill done.  

## 2016-08-01 ENCOUNTER — Ambulatory Visit: Payer: Medicare Other | Admitting: Family Medicine

## 2016-08-18 ENCOUNTER — Other Ambulatory Visit: Payer: Self-pay | Admitting: Internal Medicine

## 2016-08-20 NOTE — Telephone Encounter (Signed)
Too soon, pt already received 1 mo with 2 refills (total 3 mo) on aug 8)

## 2016-08-21 ENCOUNTER — Encounter: Payer: Self-pay | Admitting: Internal Medicine

## 2016-08-26 ENCOUNTER — Encounter: Payer: Self-pay | Admitting: Internal Medicine

## 2016-08-28 ENCOUNTER — Other Ambulatory Visit: Payer: Self-pay | Admitting: Internal Medicine

## 2016-09-09 ENCOUNTER — Encounter: Payer: Self-pay | Admitting: Internal Medicine

## 2016-09-10 NOTE — Telephone Encounter (Signed)
Pts daughter called and asked if the form for the placard could be faxed to her instead of her picking it up. Fax # is [phone]. Pt is still waiting on knee replacement forms to be faxed over to Dr Veverly Fells at [facility]. She asked that you give her a call back at [phone]. Thanks.

## 2016-09-13 ENCOUNTER — Other Ambulatory Visit: Payer: Self-pay | Admitting: Internal Medicine

## 2016-09-21 ENCOUNTER — Other Ambulatory Visit: Payer: Self-pay | Admitting: Internal Medicine

## 2016-09-22 ENCOUNTER — Other Ambulatory Visit: Payer: Self-pay | Admitting: Internal Medicine

## 2016-09-23 ENCOUNTER — Encounter: Payer: Self-pay | Admitting: Internal Medicine

## 2016-09-24 ENCOUNTER — Telehealth: Payer: Self-pay | Admitting: Emergency Medicine

## 2016-09-24 NOTE — Telephone Encounter (Signed)
[name] called and they have not received a fax from PCP stated the patient has medical clearance. Can those papers be refaxed. Fax # is [phone]. Thanks.

## 2016-09-24 NOTE — Telephone Encounter (Signed)
This has been faxed again

## 2016-09-28 ENCOUNTER — Other Ambulatory Visit: Payer: Self-pay | Admitting: Internal Medicine

## 2016-10-14 ENCOUNTER — Other Ambulatory Visit: Payer: Self-pay | Admitting: Internal Medicine

## 2016-10-31 NOTE — H&P (Signed)
[name] is an 80 y.o. female.    Chief Complaint: right knee pain  HPI: Pt is a 80 y.o. female complaining of right knee pain for multiple years. Pain had continually increased since the beginning. X-rays in the clinic show end-stage arthritic changes of the right knee. Pt has tried various conservative treatments which have failed to alleviate their symptoms, including injections and therapy. Various options are discussed with the patient. Risks, benefits and expectations were discussed with the patient. Patient understand the risks, benefits and expectations and wishes to proceed with surgery.   PCP:  Cathlean Cower, MD  D/C Plans: Home  PMH: Past Medical History:  Diagnosis Date  . Allergy   . Chalazion 10/30/2007  . DEPRESSION 06/13/2007  . [place] DISEASE, LUMBAR 06/21/2009  . DIVERTICULOSIS, COLON 06/22/2008  . DVT, HX OF 06/13/2007  . FATIGUE 06/21/2009  . GERD 06/13/2007  . Glaucoma   . HYPERLIPIDEMIA 06/22/2008  . HYPERTENSION 10/30/2007  . Impaired glucose tolerance 04/02/2012  . OSTEOPENIA 06/13/2007  . PARESTHESIA, HANDS 10/30/2007  . Preventative health care 03/31/2011  . Restless leg   . SCIATICA, LEFT 12/28/2008    PSH: Past Surgical History:  Procedure Laterality Date  . BACK SURGERY  2011  . BREAST LUMPECTOMY Left April 2008  . CERVICAL FUSION  2011  . FOOT SURGERY Right 1998  . KNEE ARTHROSCOPY Left 2002  . s/p right cataract   Feb. 2012  . TUBAL LIGATION  1970    Social History:  reports that she has never smoked. She has never used smokeless tobacco. She reports that she does not drink alcohol or use drugs.  Allergies:  Allergies  Allergen Reactions  . Codeine Nausea And Vomiting  . Moxifloxacin     Yeast infection  . Penicillins Itching    Yeast infection Has patient had a PCN reaction causing immediate rash, facial/tongue/throat swelling, SOB or lightheadedness with hypotension: No Has patient had a PCN reaction causing severe rash involving mucus  membranes or skin necrosis: No Has patient had a PCN reaction that required hospitalization No Has patient had a PCN reaction occurring within the last 10 years: No If all of the above answers are "NO", then may proceed with Cephalosporin use.   . Pramipexole Dihydrochloride     Per pt: unknown  . Ropinirole Hydrochloride     Per pt: unknown    Medications: No current facility-administered medications for this encounter.    Current Outpatient Prescriptions  Medication Sig Dispense Refill  . acetaminophen (TYLENOL) 500 MG tablet Take 500 mg by mouth daily as needed for moderate pain or headache.     . ALPRAZolam (XANAX) 0.25 MG tablet TAKE 1 TABLET BY MOUTH TWICE DAILY AS NEEDED (Patient taking differently: Take 0.25mg  by mouth every morning) 60 tablet 2  . amLODipine (NORVASC) 2.5 MG tablet TAKE 1 TABLET(2.5 MG) BY MOUTH DAILY 90 tablet 0  . ANUCORT-HC 25 MG suppository UNWRAP AND INSERT 1 SUPPOSITORY RECTALLY TWICE DAILY AS DIRECTED (Patient taking differently: UNWRAP AND INSERT 1 SUPPOSITORY RECTALLY TWICE DAILY AS NEEDED FOR HEMORRHOIDS) 12 suppository 0  . aspirin EC 325 MG tablet Take 650 mg by mouth daily as needed for moderate pain.    . Biotin (BIOTIN 5000) 5 MG CAPS Take 5,000 mg by mouth daily.    . clobetasol cream (TEMOVATE) 0.05 % Apply topically 2 (two) times daily. Apply topically. (Patient taking differently: Apply 1 application topically daily as needed (rash). ) 30 g 1  . CRANBERRY  FRUIT PO Take 2 tablets by mouth daily.     Marland Kitchen docusate sodium (COLACE) 100 MG capsule Take 100 mg by mouth at bedtime.     Marland Kitchen EPIPEN 2-PAK 0.3 MG/0.3ML SOAJ injection Inject 0.3 mg into the skin as needed (allergic reaction).   12  . estradiol (ESTRACE) 0.1 MG/GM vaginal cream Place 1 Applicatorful vaginally 2 (two) times a week.    . fexofenadine (ALLEGRA) 180 MG tablet Take 180 mg by mouth daily as needed for allergies.     . fluconazole (DIFLUCAN) 100 MG tablet 1 by mouth once daily for 3  days as needed 12 tablet 2  . gabapentin (NEURONTIN) 300 MG capsule TAKE 2 CAPSULE BY MOUTH THREE TIMES DAILY (Patient taking differently: Take 600 mg by mouth 2 (two) times daily. ) 540 capsule 1  . gentamicin ointment (GARAMYCIN) 0.1 % Apply 1 application topically 3 (three) times daily. Apply ointment to nose TID (Patient taking differently: Apply 1 application topically 2 (two) times daily. Apply ointment to nose) 15 g 15  . Lactobacillus-Inulin (CULTURELLE DIGESTIVE HEALTH) CHEW Chew 1 capsule by mouth daily.     Marland Kitchen latanoprost (XALATAN) 0.005 % ophthalmic solution Place 1 drop into both eyes at bedtime.      . Multiple Vitamins-Minerals (MULTIVITAMIN PO) Take 2 tablets by mouth daily.     . nitrofurantoin, macrocrystal-monohydrate, (MACROBID) 100 MG capsule Take 100 mg by mouth as needed. For bladder infection  3  . nystatin cream (MYCOSTATIN) Apply 1 application topically as needed for dry skin.    . pravastatin (PRAVACHOL) 40 MG tablet TAKE 1 TABLET(40 MG) BY MOUTH DAILY 90 tablet 0  . PROCTOZONE-HC 2.5 % rectal cream APPLY CREAM RECTALLY TWICE DAILY AS DIRECTED 30 g 0  . sertraline (ZOLOFT) 100 MG tablet TAKE 2 TABLETS BY MOUTH EVERY DAY 180 tablet 3  . terconazole (TERAZOL 7) 0.4 % vaginal cream Place 1 applicator vaginally daily as needed (discomfort).    . timolol (BETIMOL) 0.5 % ophthalmic solution Place 1 drop into both eyes daily.    . traMADol (ULTRAM) 50 MG tablet Take 50 mg by mouth 3 (three) times daily as needed for pain.  0  . meloxicam (MOBIC) 7.5 MG tablet TAKE 1 TABLET(7.5 MG) BY MOUTH DAILY (Patient not taking: Reported on 10/31/2016) 30 tablet 3    No results found for this or any previous visit (from the past 48 hour(s)). No results found.  ROS: Pain with rom of the right lower extremity  Physical Exam:  Alert and oriented 80 y.o. female in no acute distress Cranial nerves 2-12 intact Cervical spine: full rom with no tenderness, nv intact distally Chest: active  breath sounds bilaterally, no wheeze rhonchi or rales Heart: regular rate and rhythm, no murmur Abd: non tender non distended with active bowel sounds Hip is stable with rom  Right knee crepitus with rom nv intact distally No rashes or edema Antalgic gait  Assessment/Plan Assessment: right knee end stage osteoarthritis  Plan: Patient will undergo a right total knee by Dr. Veverly Fells at [facility]. Risks benefits and expectations were discussed with the patient. Patient understand risks, benefits and expectations and wishes to proceed.

## 2016-11-02 ENCOUNTER — Other Ambulatory Visit: Payer: Self-pay | Admitting: Internal Medicine

## 2016-11-04 ENCOUNTER — Encounter (HOSPITAL_COMMUNITY)
Admission: RE | Admit: 2016-11-04 | Discharge: 2016-11-04 | Disposition: A | Discharge status: Home / Self Care | Payer: Medicare Other | Source: Ambulatory Visit | Attending: Orthopedic Surgery | Admitting: Orthopedic Surgery

## 2016-11-04 ENCOUNTER — Encounter (HOSPITAL_COMMUNITY): Payer: Self-pay

## 2016-11-04 DIAGNOSIS — Z01818 Encounter for other preprocedural examination: Secondary | ICD-10-CM | POA: Diagnosis present

## 2016-11-04 DIAGNOSIS — M1711 Unilateral primary osteoarthritis, right knee: Secondary | ICD-10-CM | POA: Diagnosis not present

## 2016-11-04 LAB — SURGICAL PCR SCREEN
MRSA, PCR: NEGATIVE
STAPHYLOCOCCUS AUREUS: POSITIVE — AB

## 2016-11-04 LAB — BASIC METABOLIC PANEL
Anion gap: 6 (ref 5–15)
BUN: 15 mg/dL (ref 6–20)
CALCIUM: 9.7 mg/dL (ref 8.9–10.3)
CO2: 28 mmol/L (ref 22–32)
CREATININE: 0.7 mg/dL (ref 0.44–1.00)
Chloride: 106 mmol/L (ref 101–111)
Glucose, Bld: 103 mg/dL — ABNORMAL HIGH (ref 65–99)
Potassium: 4.3 mmol/L (ref 3.5–5.1)
SODIUM: 140 mmol/L (ref 135–145)

## 2016-11-04 LAB — CBC
HCT: 41.3 % (ref 36.0–46.0)
Hemoglobin: 13.1 g/dL (ref 12.0–15.0)
MCH: 31.3 pg (ref 26.0–34.0)
MCHC: 31.7 g/dL (ref 30.0–36.0)
MCV: 98.6 fL (ref 78.0–100.0)
PLATELETS: 216 10*3/uL (ref 150–400)
RBC: 4.19 MIL/uL (ref 3.87–5.11)
RDW: 13.4 % (ref 11.5–15.5)
WBC: 8.4 10*3/uL (ref 4.0–10.5)

## 2016-11-04 NOTE — Progress Notes (Signed)
PCP: Dr. James John 

## 2016-11-04 NOTE — Pre-Procedure Instructions (Signed)
    [name]  11/04/2016      [id number] - [place], [address] [place] [address] Phone: [phone] Fax: [phone]  [facility] [place], [place] [place] [address] Phone: [phone] Fax: [phone]    Your procedure is scheduled on Friday, December 29.  Report to [facility] Admitting at 11:30 AM                Your surgery or procedure is scheduled for 1:30 PM   Call this number if you have problems the morning of surgery: [phone]     Remember:  Do not eat food or drink liquids after midnight Thursday, December 28.  Take these medicines the morning of surgery with A SIP OF WATER:amLODipine (NORVASC), gabapentin (NEURONTIN), sertraline (ZOLOFT).                  May take if needed: traMADol Veatrice Bourbon), fexofenadine (ALLEGRA).                1 Week prior to surgery STOP taking Aspirin, Aspirin Products (Goody Powder, Excedrin Migraine), Ibuprofen (Advil), Naproxen (Aleve), Vitamins and Herbal Products (ie Fish Oil)   Do not wear jewelry, make-up or nail polish.  Do not wear lotions, powders, or perfumes, or deodorant.  Do not shave 48 hours prior to surgery.     Do not bring valuables to the hospital.  [facility] is not responsible for any belongings or valuables.  Contacts, dentures or bridgework may not be worn into surgery.  Leave your suitcase in the car.  After surgery it may be brought to your room.  For patients admitted to the hospital, discharge time will be determined by your treatment team.  Special instructions:  Review  [place] - Preparing For Surgery.  Please  read over the following fact sheets that you were given:[place]- Preparing For Surgery and Patient Instructions for Mupirocin Application, Incentive Spirometry, Pain Booklet

## 2016-11-05 ENCOUNTER — Telehealth: Payer: Self-pay

## 2016-11-05 MED ORDER — HYDROCORTISONE 2.5 % RE CREA: TOPICAL_CREAM | RECTAL | 0 refills | Status: DC

## 2016-11-05 NOTE — Telephone Encounter (Signed)
Refill request for medication done.

## 2016-11-14 MED ORDER — CLINDAMYCIN PHOSPHATE 900 MG/50ML IV SOLN
900.0000 mg | INTRAVENOUS | Status: AC
  Administered 2016-11-15: 900 mg via INTRAVENOUS
  Filled 2016-11-14: qty 50

## 2016-11-14 MED ORDER — TRANEXAMIC ACID 1000 MG/10ML IV SOLN
2000.0000 mg | INTRAVENOUS | Status: AC
  Administered 2016-11-15: 2000 mg via TOPICAL
  Filled 2016-11-14: qty 20

## 2016-11-15 ENCOUNTER — Encounter (HOSPITAL_COMMUNITY)
Admission: RE | Disposition: A | Discharge status: Skilled Nursing Facility | Payer: Self-pay | Source: Ambulatory Visit | Attending: Orthopedic Surgery

## 2016-11-15 ENCOUNTER — Inpatient Hospital Stay (HOSPITAL_COMMUNITY)
Admission: RE | Admit: 2016-11-15 | Discharge: 2016-11-19 | DRG: 470 | Disposition: A | Discharge status: Skilled Nursing Facility | Payer: Medicare Other | Source: Ambulatory Visit | Attending: Orthopedic Surgery | Admitting: Orthopedic Surgery

## 2016-11-15 ENCOUNTER — Inpatient Hospital Stay (HOSPITAL_COMMUNITY): Payer: Medicare Other | Admitting: Anesthesiology

## 2016-11-15 ENCOUNTER — Encounter (HOSPITAL_COMMUNITY): Payer: Self-pay | Admitting: Surgery

## 2016-11-15 ENCOUNTER — Inpatient Hospital Stay (HOSPITAL_COMMUNITY): Payer: Medicare Other

## 2016-11-15 DIAGNOSIS — Z888 Allergy status to other drugs, medicaments and biological substances status: Secondary | ICD-10-CM

## 2016-11-15 DIAGNOSIS — Z881 Allergy status to other antibiotic agents status: Secondary | ICD-10-CM | POA: Diagnosis not present

## 2016-11-15 DIAGNOSIS — M8580 Other specified disorders of bone density and structure, unspecified site: Secondary | ICD-10-CM | POA: Diagnosis present

## 2016-11-15 DIAGNOSIS — Z885 Allergy status to narcotic agent status: Secondary | ICD-10-CM

## 2016-11-15 DIAGNOSIS — M25561 Pain in right knee: Secondary | ICD-10-CM | POA: Diagnosis present

## 2016-11-15 DIAGNOSIS — E785 Hyperlipidemia, unspecified: Secondary | ICD-10-CM | POA: Diagnosis present

## 2016-11-15 DIAGNOSIS — M5432 Sciatica, left side: Secondary | ICD-10-CM | POA: Diagnosis present

## 2016-11-15 DIAGNOSIS — Z880 Allergy status to penicillin: Secondary | ICD-10-CM | POA: Diagnosis not present

## 2016-11-15 DIAGNOSIS — Z7982 Long term (current) use of aspirin: Secondary | ICD-10-CM

## 2016-11-15 DIAGNOSIS — G2581 Restless legs syndrome: Secondary | ICD-10-CM | POA: Diagnosis present

## 2016-11-15 DIAGNOSIS — Z981 Arthrodesis status: Secondary | ICD-10-CM

## 2016-11-15 DIAGNOSIS — K219 Gastro-esophageal reflux disease without esophagitis: Secondary | ICD-10-CM | POA: Diagnosis present

## 2016-11-15 DIAGNOSIS — G8918 Other acute postprocedural pain: Secondary | ICD-10-CM

## 2016-11-15 DIAGNOSIS — I10 Essential (primary) hypertension: Secondary | ICD-10-CM | POA: Diagnosis present

## 2016-11-15 DIAGNOSIS — Z96651 Presence of right artificial knee joint: Secondary | ICD-10-CM

## 2016-11-15 DIAGNOSIS — M1711 Unilateral primary osteoarthritis, right knee: Principal | ICD-10-CM | POA: Diagnosis present

## 2016-11-15 DIAGNOSIS — R262 Difficulty in walking, not elsewhere classified: Secondary | ICD-10-CM

## 2016-11-15 DIAGNOSIS — M25661 Stiffness of right knee, not elsewhere classified: Secondary | ICD-10-CM

## 2016-11-15 HISTORY — PX: TOTAL KNEE ARTHROPLASTY: SHX125

## 2016-11-15 LAB — PROTIME-INR
INR: 1.09
Prothrombin Time: 14.1 seconds (ref 11.4–15.2)

## 2016-11-15 LAB — CBC
HCT: 41.7 % (ref 36.0–46.0)
Hemoglobin: 13.3 g/dL (ref 12.0–15.0)
MCH: 30.9 pg (ref 26.0–34.0)
MCHC: 31.9 g/dL (ref 30.0–36.0)
MCV: 97 fL (ref 78.0–100.0)
PLATELETS: 187 10*3/uL (ref 150–400)
RBC: 4.3 MIL/uL (ref 3.87–5.11)
RDW: 12.9 % (ref 11.5–15.5)
WBC: 8.4 10*3/uL (ref 4.0–10.5)

## 2016-11-15 LAB — CREATININE, SERUM
CREATININE: 0.68 mg/dL (ref 0.44–1.00)
GFR calc Af Amer: >60 mL/min (ref >60)

## 2016-11-15 SURGERY — ARTHROPLASTY, KNEE, TOTAL
Anesthesia: Monitor Anesthesia Care | Site: Knee | Laterality: Right

## 2016-11-15 MED ORDER — BUPIVACAINE IN DEXTROSE 0.75-8.25 % IT SOLN
INTRATHECAL | Status: DC | PRN
  Administered 2016-11-15: 2 mL via INTRATHECAL

## 2016-11-15 MED ORDER — ASPIRIN EC 325 MG PO TBEC: 650.0000 mg | DELAYED_RELEASE_TABLET | Freq: Every day | ORAL | Status: DC | PRN

## 2016-11-15 MED ORDER — FLUCONAZOLE 100 MG PO TABS
100.0000 mg | ORAL_TABLET | Freq: Every day | ORAL | Status: DC
  Administered 2016-11-16 – 2016-11-19 (×4): 100 mg via ORAL
  Filled 2016-11-15 (×4): qty 1

## 2016-11-15 MED ORDER — FERROUS SULFATE 325 (65 FE) MG PO TABS
325.0000 mg | ORAL_TABLET | Freq: Three times a day (TID) | ORAL | Status: DC
  Administered 2016-11-15 – 2016-11-19 (×11): 325 mg via ORAL
  Filled 2016-11-15 (×12): qty 1

## 2016-11-15 MED ORDER — SODIUM CHLORIDE 0.9 % IV SOLN
INTRAVENOUS | Status: DC
  Administered 2016-11-15: 1 mL via INTRAVENOUS

## 2016-11-15 MED ORDER — WARFARIN VIDEO
Freq: Once | Status: AC
  Administered 2016-11-16: 13:00:00

## 2016-11-15 MED ORDER — DEXAMETHASONE SODIUM PHOSPHATE 10 MG/ML IJ SOLN
INTRAMUSCULAR | Status: AC
  Filled 2016-11-15: qty 1

## 2016-11-15 MED ORDER — ENOXAPARIN SODIUM 30 MG/0.3ML ~~LOC~~ SOLN
30.0000 mg | Freq: Two times a day (BID) | SUBCUTANEOUS | Status: DC
  Administered 2016-11-16 – 2016-11-19 (×7): 30 mg via SUBCUTANEOUS
  Filled 2016-11-15 (×7): qty 0.3

## 2016-11-15 MED ORDER — BISACODYL 10 MG RE SUPP: 10.0000 mg | Freq: Every day | RECTAL | Status: DC | PRN

## 2016-11-15 MED ORDER — FENTANYL CITRATE (PF) 100 MCG/2ML IJ SOLN
INTRAMUSCULAR | Status: AC
  Administered 2016-11-15: 50 ug
  Filled 2016-11-15: qty 2

## 2016-11-15 MED ORDER — ACETAMINOPHEN 650 MG RE SUPP: 650.0000 mg | Freq: Four times a day (QID) | RECTAL | Status: DC | PRN

## 2016-11-15 MED ORDER — AMLODIPINE BESYLATE 2.5 MG PO TABS
2.5000 mg | ORAL_TABLET | Freq: Every day | ORAL | Status: DC
Start: 2016-11-16 — End: 2016-11-19
  Administered 2016-11-16 – 2016-11-19 (×4): 2.5 mg via ORAL
  Filled 2016-11-15 (×4): qty 1

## 2016-11-15 MED ORDER — CLOTRIMAZOLE 2 % VA CREA
1.0000 | TOPICAL_CREAM | Freq: Every day | VAGINAL | Status: AC
  Administered 2016-11-15: 1 via VAGINAL
  Filled 2016-11-15: qty 21

## 2016-11-15 MED ORDER — OXYCODONE HCL 5 MG PO TABS
5.0000 mg | ORAL_TABLET | ORAL | Status: DC | PRN
  Administered 2016-11-15 – 2016-11-16 (×5): 10 mg via ORAL
  Filled 2016-11-15 (×5): qty 2

## 2016-11-15 MED ORDER — ONDANSETRON HCL 4 MG/2ML IJ SOLN: 4.0000 mg | Freq: Once | INTRAMUSCULAR | Status: DC | PRN

## 2016-11-15 MED ORDER — NYSTATIN 100000 UNIT/GM EX CREA
1.0000 "application " | TOPICAL_CREAM | CUTANEOUS | Status: DC | PRN
  Filled 2016-11-15: qty 15

## 2016-11-15 MED ORDER — PRAVASTATIN SODIUM 40 MG PO TABS
40.0000 mg | ORAL_TABLET | Freq: Every day | ORAL | Status: DC
  Administered 2016-11-15 – 2016-11-18 (×4): 40 mg via ORAL
  Filled 2016-11-15 (×4): qty 1

## 2016-11-15 MED ORDER — ROPIVACAINE HCL 7.5 MG/ML IJ SOLN
INTRAMUSCULAR | Status: DC | PRN
  Administered 2016-11-15: 20 mL via PERINEURAL

## 2016-11-15 MED ORDER — SACCHAROMYCES BOULARDII 250 MG PO CAPS
250.0000 mg | ORAL_CAPSULE | Freq: Every day | ORAL | Status: DC
  Administered 2016-11-16 – 2016-11-19 (×4): 250 mg via ORAL
  Filled 2016-11-15 (×4): qty 1

## 2016-11-15 MED ORDER — FENTANYL CITRATE (PF) 100 MCG/2ML IJ SOLN
25.0000 ug | INTRAMUSCULAR | Status: DC | PRN
  Administered 2016-11-15 (×2): 50 ug via INTRAVENOUS

## 2016-11-15 MED ORDER — LATANOPROST 0.005 % OP SOLN
1.0000 [drp] | Freq: Every day | OPHTHALMIC | Status: DC
  Administered 2016-11-15 – 2016-11-18 (×4): 1 [drp] via OPHTHALMIC
  Filled 2016-11-15: qty 2.5

## 2016-11-15 MED ORDER — ESTRADIOL 0.1 MG/GM VA CREA
1.0000 | TOPICAL_CREAM | VAGINAL | Status: DC
  Administered 2016-11-15: 1 via VAGINAL
  Filled 2016-11-15: qty 42.5

## 2016-11-15 MED ORDER — OXYCODONE-ACETAMINOPHEN 5-325 MG PO TABS: 1.0000 | ORAL_TABLET | ORAL | 0 refills | Status: DC | PRN

## 2016-11-15 MED ORDER — BIOTIN 5 MG PO CAPS: 5000.0000 mg | ORAL_CAPSULE | Freq: Every day | ORAL | Status: DC

## 2016-11-15 MED ORDER — CLOBETASOL PROPIONATE 0.05 % EX CREA
TOPICAL_CREAM | Freq: Two times a day (BID) | CUTANEOUS | Status: DC
  Administered 2016-11-15: 20:00:00 via TOPICAL
  Filled 2016-11-15: qty 15

## 2016-11-15 MED ORDER — METHOCARBAMOL 500 MG PO TABS: 500.0000 mg | ORAL_TABLET | Freq: Three times a day (TID) | ORAL | 0 refills | Status: DC | PRN

## 2016-11-15 MED ORDER — DOCUSATE SODIUM 100 MG PO CAPS
100.0000 mg | ORAL_CAPSULE | Freq: Two times a day (BID) | ORAL | Status: DC
  Administered 2016-11-15 – 2016-11-19 (×8): 100 mg via ORAL
  Filled 2016-11-15 (×8): qty 1

## 2016-11-15 MED ORDER — PROPOFOL 500 MG/50ML IV EMUL
INTRAVENOUS | Status: DC | PRN
  Administered 2016-11-15: 100 ug/kg/min via INTRAVENOUS

## 2016-11-15 MED ORDER — ACETAMINOPHEN 325 MG PO TABS
650.0000 mg | ORAL_TABLET | Freq: Four times a day (QID) | ORAL | Status: DC | PRN
  Administered 2016-11-18 (×2): 650 mg via ORAL
  Filled 2016-11-15 (×3): qty 2

## 2016-11-15 MED ORDER — SODIUM CHLORIDE 0.9 % IR SOLN
Status: DC | PRN
  Administered 2016-11-15: 3000 mL

## 2016-11-15 MED ORDER — FENTANYL CITRATE (PF) 100 MCG/2ML IJ SOLN
INTRAMUSCULAR | Status: AC
  Filled 2016-11-15: qty 2

## 2016-11-15 MED ORDER — LORATADINE 10 MG PO TABS
10.0000 mg | ORAL_TABLET | Freq: Every day | ORAL | Status: DC
  Administered 2016-11-16 – 2016-11-19 (×4): 10 mg via ORAL
  Filled 2016-11-15 (×4): qty 1

## 2016-11-15 MED ORDER — ENOXAPARIN SODIUM 40 MG/0.4ML ~~LOC~~ SOLN: 40.0000 mg | SUBCUTANEOUS | 0 refills | Status: DC

## 2016-11-15 MED ORDER — DOCUSATE SODIUM 100 MG PO CAPS: 100.0000 mg | ORAL_CAPSULE | Freq: Every day | ORAL | Status: DC

## 2016-11-15 MED ORDER — NITROFURANTOIN MONOHYD MACRO 100 MG PO CAPS
100.0000 mg | ORAL_CAPSULE | Freq: Every day | ORAL | Status: DC
  Administered 2016-11-17 – 2016-11-18 (×2): 100 mg via ORAL
  Filled 2016-11-15 (×4): qty 1

## 2016-11-15 MED ORDER — EPINEPHRINE 0.3 MG/0.3ML IJ SOAJ: 0.3000 mg | INTRAMUSCULAR | Status: DC | PRN

## 2016-11-15 MED ORDER — METOCLOPRAMIDE HCL 5 MG PO TABS: 5.0000 mg | ORAL_TABLET | Freq: Three times a day (TID) | ORAL | Status: DC | PRN

## 2016-11-15 MED ORDER — ONDANSETRON HCL 4 MG PO TABS: 4.0000 mg | ORAL_TABLET | Freq: Four times a day (QID) | ORAL | Status: DC | PRN

## 2016-11-15 MED ORDER — ALPRAZOLAM 0.25 MG PO TABS: 0.2500 mg | ORAL_TABLET | Freq: Two times a day (BID) | ORAL | Status: DC | PRN

## 2016-11-15 MED ORDER — GABAPENTIN 300 MG PO CAPS
600.0000 mg | ORAL_CAPSULE | Freq: Two times a day (BID) | ORAL | Status: DC
  Administered 2016-11-15 – 2016-11-19 (×8): 600 mg via ORAL
  Filled 2016-11-15 (×8): qty 2

## 2016-11-15 MED ORDER — GENTAMICIN SULFATE 0.1 % EX OINT
1.0000 "application " | TOPICAL_OINTMENT | Freq: Three times a day (TID) | CUTANEOUS | Status: DC
  Administered 2016-11-15: 1 via TOPICAL
  Filled 2016-11-15: qty 15

## 2016-11-15 MED ORDER — SERTRALINE HCL 100 MG PO TABS
200.0000 mg | ORAL_TABLET | Freq: Every day | ORAL | Status: DC
  Administered 2016-11-16 – 2016-11-19 (×4): 200 mg via ORAL
  Filled 2016-11-15 (×4): qty 2

## 2016-11-15 MED ORDER — POLYETHYLENE GLYCOL 3350 17 G PO PACK
17.0000 g | PACK | Freq: Every day | ORAL | Status: DC | PRN
  Administered 2016-11-17 – 2016-11-18 (×2): 17 g via ORAL
  Filled 2016-11-15 (×2): qty 1

## 2016-11-15 MED ORDER — METOCLOPRAMIDE HCL 5 MG/ML IJ SOLN: 5.0000 mg | Freq: Three times a day (TID) | INTRAMUSCULAR | Status: DC | PRN

## 2016-11-15 MED ORDER — HYDROMORPHONE HCL 2 MG/ML IJ SOLN
0.5000 mg | INTRAMUSCULAR | Status: DC | PRN
  Administered 2016-11-15 – 2016-11-16 (×3): 1 mg via INTRAVENOUS
  Filled 2016-11-15 (×3): qty 1

## 2016-11-15 MED ORDER — PHENOL 1.4 % MT LIQD: 1.0000 | OROMUCOSAL | Status: DC | PRN

## 2016-11-15 MED ORDER — HYDROCORTISONE ACETATE 25 MG RE SUPP
25.0000 mg | Freq: Two times a day (BID) | RECTAL | Status: DC | PRN
  Filled 2016-11-15: qty 1

## 2016-11-15 MED ORDER — TRAMADOL HCL 50 MG PO TABS
50.0000 mg | ORAL_TABLET | Freq: Four times a day (QID) | ORAL | Status: DC | PRN
  Administered 2016-11-16 – 2016-11-17 (×2): 50 mg via ORAL
  Administered 2016-11-17 (×2): 100 mg via ORAL
  Administered 2016-11-18 – 2016-11-19 (×3): 50 mg via ORAL
  Administered 2016-11-19: 100 mg via ORAL
  Filled 2016-11-15: qty 1
  Filled 2016-11-15: qty 2
  Filled 2016-11-15 (×3): qty 1
  Filled 2016-11-15 (×2): qty 2
  Filled 2016-11-15: qty 1

## 2016-11-15 MED ORDER — ONDANSETRON HCL 4 MG/2ML IJ SOLN
4.0000 mg | Freq: Four times a day (QID) | INTRAMUSCULAR | Status: DC | PRN
  Administered 2016-11-16: 4 mg via INTRAVENOUS
  Filled 2016-11-15: qty 2

## 2016-11-15 MED ORDER — ONDANSETRON HCL 4 MG/2ML IJ SOLN
INTRAMUSCULAR | Status: AC
  Filled 2016-11-15: qty 2

## 2016-11-15 MED ORDER — 0.9 % SODIUM CHLORIDE (POUR BTL) OPTIME
TOPICAL | Status: DC | PRN
  Administered 2016-11-15: 1000 mL

## 2016-11-15 MED ORDER — TIMOLOL MALEATE 0.5 % OP SOLN
1.0000 [drp] | Freq: Every day | OPHTHALMIC | Status: DC
  Administered 2016-11-16 – 2016-11-19 (×4): 1 [drp] via OPHTHALMIC
  Filled 2016-11-15: qty 5

## 2016-11-15 MED ORDER — DEXTROSE 5 % IV SOLN
500.0000 mg | Freq: Four times a day (QID) | INTRAVENOUS | Status: DC | PRN
  Filled 2016-11-15: qty 5

## 2016-11-15 MED ORDER — PATIENT'S GUIDE TO USING COUMADIN BOOK
Freq: Once | Status: AC
  Administered 2016-11-15: 18:00:00
  Filled 2016-11-15: qty 1

## 2016-11-15 MED ORDER — TRAMADOL HCL 50 MG PO TABS: 50.0000 mg | ORAL_TABLET | Freq: Four times a day (QID) | ORAL | 0 refills | Status: DC | PRN

## 2016-11-15 MED ORDER — PROPOFOL 10 MG/ML IV BOLUS
INTRAVENOUS | Status: AC
  Filled 2016-11-15: qty 20

## 2016-11-15 MED ORDER — CHLORHEXIDINE GLUCONATE 4 % EX LIQD: 60.0000 mL | Freq: Once | CUTANEOUS | Status: DC

## 2016-11-15 MED ORDER — WARFARIN SODIUM 5 MG PO TABS
5.0000 mg | ORAL_TABLET | Freq: Once | ORAL | Status: AC
  Administered 2016-11-15: 5 mg via ORAL
  Filled 2016-11-15: qty 1

## 2016-11-15 MED ORDER — PHENYLEPHRINE HCL 10 MG/ML IJ SOLN
INTRAVENOUS | Status: DC | PRN
  Administered 2016-11-15: 25 ug/min via INTRAVENOUS

## 2016-11-15 MED ORDER — WARFARIN SODIUM 5 MG PO TABS: 5.0000 mg | ORAL_TABLET | Freq: Every day | ORAL | 0 refills | Status: DC

## 2016-11-15 MED ORDER — CLINDAMYCIN PHOSPHATE 600 MG/50ML IV SOLN
600.0000 mg | Freq: Four times a day (QID) | INTRAVENOUS | Status: AC
  Administered 2016-11-15 (×2): 600 mg via INTRAVENOUS
  Filled 2016-11-15 (×2): qty 50

## 2016-11-15 MED ORDER — HYDROCORTISONE 2.5 % RE CREA
TOPICAL_CREAM | Freq: Two times a day (BID) | RECTAL | Status: DC
  Administered 2016-11-15: 20:00:00 via RECTAL
  Filled 2016-11-15: qty 28.35

## 2016-11-15 MED ORDER — ACETAMINOPHEN 500 MG PO TABS: 500.0000 mg | ORAL_TABLET | Freq: Every day | ORAL | Status: DC | PRN

## 2016-11-15 MED ORDER — WARFARIN - PHARMACIST DOSING INPATIENT
Freq: Every day | Status: DC
  Administered 2016-11-15 – 2016-11-18 (×2)

## 2016-11-15 MED ORDER — LACTATED RINGERS IV SOLN
INTRAVENOUS | Status: DC
  Administered 2016-11-15: 11:00:00 via INTRAVENOUS

## 2016-11-15 MED ORDER — MENTHOL 3 MG MT LOZG: 1.0000 | LOZENGE | OROMUCOSAL | Status: DC | PRN

## 2016-11-15 MED ORDER — DEXAMETHASONE SODIUM PHOSPHATE 10 MG/ML IJ SOLN
INTRAMUSCULAR | Status: DC | PRN
  Administered 2016-11-15: 10 mg via INTRAVENOUS

## 2016-11-15 MED ORDER — METHOCARBAMOL 500 MG PO TABS
500.0000 mg | ORAL_TABLET | Freq: Four times a day (QID) | ORAL | Status: DC | PRN
  Administered 2016-11-15 – 2016-11-16 (×2): 500 mg via ORAL
  Filled 2016-11-15 (×2): qty 1

## 2016-11-15 MED ORDER — MIDAZOLAM HCL 2 MG/2ML IJ SOLN
INTRAMUSCULAR | Status: AC
  Filled 2016-11-15: qty 2

## 2016-11-15 MED ORDER — ONDANSETRON HCL 4 MG/2ML IJ SOLN
INTRAMUSCULAR | Status: DC | PRN
  Administered 2016-11-15: 4 mg via INTRAVENOUS

## 2016-11-15 SURGICAL SUPPLY — 67 items
BANDAGE ESMARK 6X9 LF (GAUZE/BANDAGES/DRESSINGS) ×1 IMPLANT
BLADE SAG 18X100X1.27 (BLADE) ×3 IMPLANT
BLADE SAW SGTL 13X75X1.27 (BLADE) ×3 IMPLANT
BLADE SAW SGTL 18X1.27X75 (BLADE) ×2 IMPLANT
BLADE SAW SGTL 18X1.27X75MM (BLADE) ×1
BNDG CMPR 9X6 STRL LF SNTH (GAUZE/BANDAGES/DRESSINGS) ×1
BNDG CMPR MED 10X6 ELC LF (GAUZE/BANDAGES/DRESSINGS) ×1
BNDG ELASTIC 6X10 VLCR STRL LF (GAUZE/BANDAGES/DRESSINGS) ×3 IMPLANT
BNDG ESMARK 6X9 LF (GAUZE/BANDAGES/DRESSINGS) ×3
BNDG GAUZE ELAST 4 BULKY (GAUZE/BANDAGES/DRESSINGS) ×6 IMPLANT
BOWL SMART MIX CTS (DISPOSABLE) ×3 IMPLANT
CAP KNEE TOTAL 3 SIGMA ×2 IMPLANT
CEMENT HV SMART SET (Cement) ×6 IMPLANT
CLOSURE WOUND 1/2 X4 (GAUZE/BANDAGES/DRESSINGS) ×1
COVER SURGICAL LIGHT HANDLE (MISCELLANEOUS) ×3 IMPLANT
CUFF TOURNIQUET SINGLE 34IN LL (TOURNIQUET CUFF) ×2 IMPLANT
CUFF TOURNIQUET SINGLE 44IN (TOURNIQUET CUFF) IMPLANT
DRAPE EXTREMITY T 121X128X90 (DRAPE) ×2 IMPLANT
DRAPE HALF SHEET 40X57 (DRAPES) ×3 IMPLANT
DRAPE IMP U-DRAPE 54X76 (DRAPES) ×3 IMPLANT
DRAPE ORTHO SPLIT 77X108 STRL (DRAPES) ×9
DRAPE PROXIMA HALF (DRAPES) ×3 IMPLANT
DRAPE SURG ORHT 6 SPLT 77X108 (DRAPES) IMPLANT
DRAPE U-SHAPE 47X51 STRL (DRAPES) ×3 IMPLANT
DRSG ADAPTIC 3X8 NADH LF (GAUZE/BANDAGES/DRESSINGS) ×5 IMPLANT
DRSG PAD ABDOMINAL 8X10 ST (GAUZE/BANDAGES/DRESSINGS) ×2 IMPLANT
DURAPREP 26ML APPLICATOR (WOUND CARE) ×5 IMPLANT
ELECT CAUTERY BLADE 6.4 (BLADE) ×3 IMPLANT
ELECT REM PT RETURN 9FT ADLT (ELECTROSURGICAL) ×3
ELECTRODE REM PT RTRN 9FT ADLT (ELECTROSURGICAL) ×1 IMPLANT
GAUZE SPONGE 4X4 12PLY STRL (GAUZE/BANDAGES/DRESSINGS) ×3 IMPLANT
GLOVE BIOGEL PI ORTHO PRO 7.5 (GLOVE) ×2
GLOVE BIOGEL PI ORTHO PRO SZ7 (GLOVE) ×2
GLOVE BIOGEL PI ORTHO PRO SZ8 (GLOVE) ×2
GLOVE ORTHO TXT STRL SZ7.5 (GLOVE) ×3 IMPLANT
GLOVE PI ORTHO PRO STRL 7.5 (GLOVE) ×1 IMPLANT
GLOVE PI ORTHO PRO STRL SZ7 (GLOVE) ×1 IMPLANT
GLOVE PI ORTHO PRO STRL SZ8 (GLOVE) ×1 IMPLANT
GLOVE SURG ORTHO 8.5 STRL (GLOVE) ×3 IMPLANT
GOWN STRL REUS W/ TWL XL LVL3 (GOWN DISPOSABLE) ×3 IMPLANT
GOWN STRL REUS W/TWL XL LVL3 (GOWN DISPOSABLE) ×9
HANDPIECE INTERPULSE COAX TIP (DISPOSABLE) ×3
IMMOBILIZER KNEE 22 UNIV (SOFTGOODS) ×2 IMPLANT
KIT BASIN OR (CUSTOM PROCEDURE TRAY) ×3 IMPLANT
KIT MANIFOLD (MISCELLANEOUS) ×3 IMPLANT
KIT ROOM TURNOVER OR (KITS) ×3 IMPLANT
MANIFOLD NEPTUNE II (INSTRUMENTS) ×3 IMPLANT
NS IRRIG 1000ML POUR BTL (IV SOLUTION) ×3 IMPLANT
PACK TOTAL JOINT (CUSTOM PROCEDURE TRAY) ×3 IMPLANT
PACK UNIVERSAL I (CUSTOM PROCEDURE TRAY) ×3 IMPLANT
PAD ARMBOARD 7.5X6 YLW CONV (MISCELLANEOUS) ×6 IMPLANT
SET HNDPC FAN SPRY TIP SCT (DISPOSABLE) ×1 IMPLANT
STRIP CLOSURE SKIN 1/2X4 (GAUZE/BANDAGES/DRESSINGS) ×3 IMPLANT
SUCTION FRAZIER HANDLE 10FR (MISCELLANEOUS) ×2
SUCTION TUBE FRAZIER 10FR DISP (MISCELLANEOUS) ×1 IMPLANT
SUT MNCRL AB 3-0 PS2 18 (SUTURE) ×3 IMPLANT
SUT VIC AB 0 CT1 27 (SUTURE) ×6
SUT VIC AB 0 CT1 27XBRD ANBCTR (SUTURE) ×2 IMPLANT
SUT VIC AB 1 CT1 27 (SUTURE) ×9
SUT VIC AB 1 CT1 27XBRD ANBCTR (SUTURE) ×3 IMPLANT
SUT VIC AB 2-0 CT1 27 (SUTURE) ×6
SUT VIC AB 2-0 CT1 TAPERPNT 27 (SUTURE) ×2 IMPLANT
TOWEL OR 17X24 6PK STRL BLUE (TOWEL DISPOSABLE) ×3 IMPLANT
TOWEL OR 17X26 10 PK STRL BLUE (TOWEL DISPOSABLE) ×3 IMPLANT
TRAY CATH 16FR W/PLASTIC CATH (SET/KITS/TRAYS/PACK) IMPLANT
TRAY FOLEY CATH 16FRSI W/METER (SET/KITS/TRAYS/PACK) ×2 IMPLANT
WATER STERILE IRR 1000ML POUR (IV SOLUTION) ×6 IMPLANT

## 2016-11-15 NOTE — Evaluation (Signed)
Physical Therapy Evaluation Patient Details Name: [name] MRN: [id number] DOB: Apr 07, 1936 Today's Date: 11/15/2016   History of Present Illness  80 y.o. female admitted to [facility] on 11/15/16 for elective R TKA.  Pt with significant PMHx of sciatica L, paresthesia (hands), HTN, glaucoma, DVT, lumbar disc disease, R foot surgery, back surgery, cervical fusion.    Clinical Impression  Pt is POD #0 and is moving well. Min assist with RW OOB to recliner chair.  She had very minimal lightheadedness EOB and was able to continue to mobilize.  Knee precautions and knee exercise handout education initiated.   PT to follow acutely for deficits listed below.       Follow Up Recommendations Home health PT;Supervision for mobility/OOB    Equipment Recommendations  None recommended by PT    Recommendations for Other Services   NA    Precautions / Restrictions Precautions Precautions: Knee Precaution Booklet Issued: Yes (comment) Precaution Comments: knee exercise handout given Required Braces or Orthoses:  (KI in room and used POD #0) Restrictions Weight Bearing Restrictions: Yes RLE Weight Bearing: Weight bearing as tolerated      Mobility  Bed Mobility Overal bed mobility: Needs Assistance Bed Mobility: Supine to Sit     Supine to sit: Min assist;HOB elevated     General bed mobility comments: Min assist to help progress right leg to EOB.  Verbal cues for sequencing and hand placement.  Pt using railing for leverage at trunk.   Transfers Overall transfer level: Needs assistance Equipment used: Rolling walker (2 wheeled) Transfers: Sit to/from Stand Sit to Stand: Min assist         General transfer comment: Min assist to support trunk during transitions.  Verbal cues for safe hand placement and RW use.   Ambulation/Gait Ambulation/Gait assistance: Min assist Ambulation Distance (Feet): 3 Feet Assistive device: Rolling walker (2 wheeled) Gait Pattern/deviations: Step-to  pattern;Antalgic     General Gait Details: Verbal cues for safe RW use, LE sequencing. Min assist for balance.          Balance Overall balance assessment: Needs assistance Sitting-balance support: Feet supported;No upper extremity supported Sitting balance-Leahy Scale: Good     Standing balance support: Bilateral upper extremity supported Standing balance-Leahy Scale: Poor                               Pertinent Vitals/Pain Pain Assessment: 0-10 Pain Score: 8  Pain Location: right knee Pain Descriptors / Indicators: Aching;Burning Pain Intervention(s): Limited activity within patient's tolerance;Monitored during session;Repositioned    Home Living Family/patient expects to be discharged to:: Private residence Living Arrangements: Alone (and a cat) Available Help at Discharge: Family;Available 24 hours/day (daughter for at least a week) Type of Home: House Home Access: Stairs to enter Entrance Stairs-Rails: None Entrance Stairs-Number of Steps: 1 Home Layout: One level Home Equipment: Cane - single point;Walker - 2 wheels;Other (comment);Shower seat (has access to one, neighbor is bringing shower seat)      Prior Function Level of Independence: Independent with assistive device(s)         Comments: used cane both inside and outside PTA, still drives, retired     Journalist, newspaper   Dominant Hand: Right    Extremity/Trunk Assessment   Upper Extremity Assessment Upper Extremity Assessment: Defer to OT evaluation    Lower Extremity Assessment Lower Extremity Assessment: LLE deficits/detail LLE Deficits / Details: left leg with normal post op pain  and weakness, ankle at least 3/5, knee 2/5, hip flexion 2+/5    Cervical / Trunk Assessment Cervical / Trunk Assessment: Other exceptions Cervical / Trunk Exceptions: h/o back surgery and neck surgery  Communication   Communication: No difficulties  Cognition Arousal/Alertness: Awake/alert Behavior  During Therapy: WFL for tasks assessed/performed Overall Cognitive Status: Within Functional Limits for tasks assessed                         Exercises Total Joint Exercises Ankle Circles/Pumps: AROM;Both;20 reps   Assessment/Plan    PT Assessment Patient needs continued PT services  PT Problem List Decreased strength;Decreased range of motion;Decreased activity tolerance;Decreased balance;Decreased mobility;Decreased knowledge of use of DME;Decreased knowledge of precautions;Pain          PT Treatment Interventions DME instruction;Gait training;Stair training;Functional mobility training;Therapeutic activities;Therapeutic exercise;Balance training;Patient/family education;Manual techniques;Modalities    PT Goals (Current goals can be found in the Care Plan section)  Acute Rehab PT Goals Patient Stated Goal: to go home at d/c PT Goal Formulation: With patient Time For Goal Achievement: 11/22/16 Potential to Achieve Goals: Good    Frequency 7X/week           End of Session Equipment Utilized During Treatment: Gait belt Activity Tolerance: Patient limited by pain Patient left: in chair;with call bell/phone within reach;with family/visitor present           Time: [id number] PT Time Calculation (min) (ACUTE ONLY): 27 min   Charges:   PT Evaluation $PT Eval Moderate Complexity: 1 Procedure PT Treatments $Therapeutic Activity: 8-22 mins        Kellye Mizner B. Aysia Lowder, PT, DPT [phone]   11/15/2016, 5:10 PM

## 2016-11-15 NOTE — Brief Op Note (Signed)
11/15/2016  2:34 PM  PATIENT:  [name]  80 y.o. female  PRE-OPERATIVE DIAGNOSIS:  RIGHT KNEE END STAGE OA  POST-OPERATIVE DIAGNOSIS:  RIGHT KNEE END STAGE OA  PROCEDURE:  Procedure(s): RIGHT TOTAL KNEE ARTHROPLASTY (Right) DePuy Sigma RP  SURGEON:  Surgeon(s) and Role:    * Netta Cedars, MD - Primary  PHYSICIAN ASSISTANT:   ASSISTANTS: Ventura Bruns, PA-C   ANESTHESIA:   regional and spinal  EBL:  Total I/O In: 700 [I.V.:700] Out: 225 [Urine:175; Blood:50]  BLOOD ADMINISTERED:none  DRAINS: none   LOCAL MEDICATIONS USED:  NONE  SPECIMEN:  No Specimen  DISPOSITION OF SPECIMEN:  N/A  COUNTS:  YES  TOURNIQUET:   Total Tourniquet Time Documented: Thigh (Right) - 98 minutes Total: Thigh (Right) - 98 minutes   DICTATION: .Other Dictation: Dictation Number [id number]  PLAN OF CARE: Admit to inpatient   PATIENT DISPOSITION:  PACU - hemodynamically stable.   Delay start of Pharmacological VTE agent (>24hrs) due to surgical blood loss or risk of bleeding: no

## 2016-11-15 NOTE — Anesthesia Preprocedure Evaluation (Addendum)
Anesthesia Evaluation  Patient identified by MRN, date of birth, ID band Patient awake    Reviewed: Allergy & Precautions, NPO status , Patient's Chart, lab work & pertinent test results  Airway Mallampati: II  TM Distance: >3 FB Neck ROM: Full    Dental  (+) Teeth Intact, Dental Advisory Given, Implants, Caps   Pulmonary neg pulmonary ROS,    Pulmonary exam normal breath sounds clear to auscultation       Cardiovascular hypertension, Pt. on medications (-) angina+ Peripheral Vascular Disease and + DVT  (-) CAD, (-) Past MI and (-) CHF Normal cardiovascular exam Rhythm:Regular Rate:Normal     Neuro/Psych PSYCHIATRIC DISORDERS Depression  Neuromuscular disease    GI/Hepatic Neg liver ROS, GERD  Medicated,  Endo/Other  negative endocrine ROS  Renal/GU negative Renal ROS     Musculoskeletal  (+) Arthritis , Osteoarthritis,    Abdominal   Peds  Hematology Plt 216k   Anesthesia Other Findings Day of surgery medications reviewed with the patient.  Reproductive/Obstetrics                           Anesthesia Physical Anesthesia Plan  ASA: II  Anesthesia Plan: Spinal and MAC   Post-op Pain Management:  Regional for Post-op pain   Induction: Intravenous  Airway Management Planned: Simple Face Mask  Additional Equipment:   Intra-op Plan:   Post-operative Plan:   Informed Consent: I have reviewed the patients History and Physical, chart, labs and discussed the procedure including the risks, benefits and alternatives for the proposed anesthesia with the patient or authorized representative who has indicated his/her understanding and acceptance.   Dental advisory given  Plan Discussed with: CRNA  Anesthesia Plan Comments: (Discussed risks and benefits of and differences between spinal and general. Discussed risks of spinal including headache, backache, failure, bleeding, infection, and  nerve damage. Patient consents to spinal. Questions answered. Coagulation studies and platelet count acceptable.  Discussed risks and benefits of adductor canal block including failure, bleeding, infection, nerve damage, weakness. Discussed that the block may not prevent all of the pain in the knee. Questions answered. Patient consents to block. )       Anesthesia Quick Evaluation

## 2016-11-15 NOTE — Anesthesia Procedure Notes (Signed)
Procedure Name: MAC Date/Time: 11/15/2016 12:45 PM Performed by: Kyung Rudd Pre-anesthesia Checklist: Patient identified, Emergency Drugs available, Suction available and Patient being monitored Patient Re-evaluated:Patient Re-evaluated prior to inductionOxygen Delivery Method: Simple face mask Intubation Type: IV induction Placement Confirmation: positive ETCO2

## 2016-11-15 NOTE — Anesthesia Procedure Notes (Signed)
Anesthesia Regional Block:  Adductor canal block  Pre-Anesthetic Checklist: ,, timeout performed, Correct Patient, Correct Site, Correct Laterality, Correct Procedure, Correct Position, site marked, Risks and benefits discussed,  Surgical consent,  Pre-op evaluation,  At surgeon's request and post-op pain management  Laterality: Right  Prep: chloraprep       Needles:  Injection technique: Single-shot  Needle Type: Echogenic Needle     Needle Length: 9cm 9 cm Needle Gauge: 21 and 21 G    Additional Needles:  Procedures: ultrasound guided (picture in chart) Adductor canal block Narrative:  Start time: 11/15/2016 11:22 AM End time: 11/15/2016 11:27 AM Injection made incrementally with aspirations every 5 mL.  Performed by: Personally  Anesthesiologist: Catalina Gravel  Additional Notes: No pain on injection. No increased resistance to injection. Injection made in 5cc increments.  Good needle visualization.  Patient tolerated procedure well.

## 2016-11-15 NOTE — Progress Notes (Signed)
ANTICOAGULATION CONSULT NOTE - Initial Consult  Pharmacy Consult for Coumadin Indication: VTE prophylaxis s/p right TKA  Allergies  Allergen Reactions  . Moxifloxacin Other (See Comments)    YEAST SUPERINFECTION  . Penicillins Itching and Other (See Comments)    YEAST SUPERINFECTION DUE TO PCN USE  Has patient had a PCN reaction causing immediate rash, facial/tongue/throat swelling, SOB or lightheadedness with hypotension: No Has patient had a PCN reaction causing severe rash involving mucus membranes or skin necrosis: No Has patient had a PCN reaction that required hospitalization No Has patient had a PCN reaction occurring within the last 10 years: No If all of the above answers are "NO", then may proceed with Cephalosporin use.   . Quinine Derivatives Other (See Comments)    DIZZY "CRAZY"  . Pramipexole Dihydrochloride     UNSPECIFIED REACTION   . Ropinirole Hydrochloride Other (See Comments)    UNSPECIFIED REACTION   . Codeine Nausea And Vomiting    Patient Measurements: Height: 5' 5.5" (166.4 cm) Weight: 181 lb 3.5 oz (82.2 kg) IBW/kg (Calculated) : 58.15  Vital Signs: Temp: 97.6 F (36.4 C) (12/29 1540) Temp Source: Oral (12/29 1124) BP: 141/63 (12/29 1540) Pulse Rate: 80 (12/29 1540)  Labs:  Recent Labs  11/15/16 1614  HGB 13.3  HCT 41.7  PLT 187    Estimated Creatinine Clearance: 62 mL/min (by C-G formula based on SCr of 0.7 mg/dL).   Medical History: Past Medical History:  Diagnosis Date  . Allergy   . Chalazion 10/30/2007  . DEPRESSION 06/13/2007  . [place] DISEASE, LUMBAR 06/21/2009  . DIVERTICULOSIS, COLON 06/22/2008  . DVT, HX OF 06/13/2007  . FATIGUE 06/21/2009  . GERD 06/13/2007  . Glaucoma   . HYPERLIPIDEMIA 06/22/2008  . HYPERTENSION 10/30/2007  . Impaired glucose tolerance 04/02/2012  . OSTEOPENIA 06/13/2007  . PARESTHESIA, HANDS 10/30/2007  . Preventative health care 03/31/2011  . Restless leg   . SCIATICA, LEFT 12/28/2008  . Urinary tract  infection    frequently   Assessment:  80 yr old female to begin Coumadin for VTE prophylaxis s/p right TKA.  History noted DVT in 2008, but patient reports no prior DVT, but has family history of phelbitis.    No baseline PT/INR. To begin Lovenox 30 mg sq q12hrs in am.   Expecting Coumadin for 1 month post-op  Goal of Therapy:  INR 2-3 Monitor platelets by anticoagulation protocol: Yes   Plan:   Baseline PT/INR ordered.  Coumadin 5 mg x 1 tonight.  Daily PT/INR while in the hospital.  Lovenox 30 mg sq q12hrs to begin 12/30 am and stop when INR > or = 1.8.  Coumadin education prior to discharge.  Coumadin booklet and video ordered.    Arty Baumgartner, [place] Pager: [phone] 11/15/2016,5:09 PM

## 2016-11-15 NOTE — Transfer of Care (Signed)
Immediate Anesthesia Transfer of Care Note  Patient: [name]  Procedure(s) Performed: Procedure(s): RIGHT TOTAL KNEE ARTHROPLASTY (Right)  Patient Location: PACU  Anesthesia Type:Spinal  Level of Consciousness: awake, alert  and oriented  Airway & Oxygen Therapy: Patient Spontanous Breathing and Patient connected to face mask oxygen  Post-op Assessment: Report given to RN, Post -op Vital signs reviewed and stable and Patient moving all extremities  Post vital signs: Reviewed and stable  Last Vitals:  Vitals:   11/15/16 1135 11/15/16 1140  BP: (!) 150/56 (!) 150/60  Pulse: 67 63  Resp: 13 15  Temp:      Last Pain:  Vitals:   11/15/16 1124  TempSrc: Oral      Patients Stated Pain Goal: 4 ([id number])  Complications: No apparent anesthesia complications

## 2016-11-15 NOTE — Interval H&P Note (Signed)
History and Physical Interval Note:  11/15/2016 12:01 PM  [name]  has presented today for surgery, with the diagnosis of RIGHT KNEE END STAGE OA  The various methods of treatment have been discussed with the patient and family. After consideration of risks, benefits and other options for treatment, the patient has consented to  Procedure(s): RIGHT TOTAL KNEE ARTHROPLASTY (Right) as a surgical intervention .  The patient's history has been reviewed, patient examined, no change in status, stable for surgery.  I have reviewed the patient's chart and labs.  Questions were answered to the patient's satisfaction.     Hollister Wessler,STEVEN R

## 2016-11-15 NOTE — Progress Notes (Signed)
Orthopedic Tech Progress Note Patient Details:  [name] 10-24-1936 [id number]  CPM Right Knee CPM Right Knee: On Right Knee Flexion (Degrees): 90 Right Knee Extension (Degrees): 0 Additional Comments: Applied CPM at 0-90 on right leg/knee.  Provided and applied Overhead frame with Trapeze.  Nurse was at bedside. Pt tolerated CPM Well.   Kristopher Oppenheim 11/15/2016, 3:30 PM

## 2016-11-15 NOTE — Anesthesia Procedure Notes (Signed)
Spinal  Patient location during procedure: OR Start time: 11/15/2016 12:30 PM End time: 11/15/2016 12:34 PM Staffing Anesthesiologist: Catalina Gravel Performed: anesthesiologist  Preanesthetic Checklist Completed: patient identified, surgical consent, pre-op evaluation, timeout performed, IV checked, risks and benefits discussed and monitors and equipment checked Spinal Block Patient position: sitting Prep: site prepped and draped and DuraPrep Patient monitoring: continuous pulse ox and blood pressure Approach: midline Location: L3-4 Needle Needle type: Pencan  Needle gauge: 25 G Needle length: 9 cm Assessment Sensory level: T8 Additional Notes Functioning IV was confirmed and monitors were applied. Sterile prep and drape, including hand hygiene, mask and sterile gloves were used. The patient was positioned and the spine was prepped. The skin was anesthetized with lidocaine.  Free flow of clear CSF was obtained prior to injecting local anesthetic into the CSF.  The spinal needle aspirated freely following injection.  The needle was carefully withdrawn.  The patient tolerated the procedure well. Consent was obtained prior to procedure with all questions answered and concerns addressed. Risks including but not limited to bleeding, infection, nerve damage, paralysis, failed block, inadequate analgesia, allergic reaction, high spinal, itching and headache were discussed and the patient wished to proceed.   Hoy Morn, MD

## 2016-11-15 NOTE — Anesthesia Postprocedure Evaluation (Signed)
Anesthesia Post Note  Patient: [name]  Procedure(s) Performed: Procedure(s) (LRB): RIGHT TOTAL KNEE ARTHROPLASTY (Right)  Patient location during evaluation: PACU Anesthesia Type: Spinal Level of consciousness: oriented and awake and alert Pain management: pain level controlled Vital Signs Assessment: post-procedure vital signs reviewed and stable Respiratory status: spontaneous breathing, respiratory function stable and patient connected to nasal cannula oxygen Cardiovascular status: blood pressure returned to baseline and stable Postop Assessment: no headache and no backache Anesthetic complications: no       Last Vitals:  Vitals:   11/15/16 1443 11/15/16 1458  BP: 122/62 139/74  Pulse: 79 85  Resp: 13 20  Temp: 36.4 C     Last Pain:  Vitals:   11/15/16 1443  TempSrc:   PainSc: 0-No pain                 Catalina Gravel

## 2016-11-16 LAB — CBC
HCT: 38.5 % (ref 36.0–46.0)
HEMOGLOBIN: 12.4 g/dL (ref 12.0–15.0)
MCH: 31.1 pg (ref 26.0–34.0)
MCHC: 32.2 g/dL (ref 30.0–36.0)
MCV: 96.5 fL (ref 78.0–100.0)
Platelets: 202 10*3/uL (ref 150–400)
RBC: 3.99 MIL/uL (ref 3.87–5.11)
RDW: 12.7 % (ref 11.5–15.5)
WBC: 10.2 10*3/uL (ref 4.0–10.5)

## 2016-11-16 LAB — BASIC METABOLIC PANEL
Anion gap: 8 (ref 5–15)
BUN: 12 mg/dL (ref 6–20)
CHLORIDE: 103 mmol/L (ref 101–111)
CO2: 27 mmol/L (ref 22–32)
Calcium: 8.8 mg/dL — ABNORMAL LOW (ref 8.9–10.3)
Creatinine, Ser: 0.6 mg/dL (ref 0.44–1.00)
GFR calc Af Amer: >60 mL/min (ref >60)
GFR calc non Af Amer: >60 mL/min (ref >60)
GLUCOSE: 113 mg/dL — AB (ref 65–99)
POTASSIUM: 4.2 mmol/L (ref 3.5–5.1)
Sodium: 138 mmol/L (ref 135–145)

## 2016-11-16 LAB — PROTIME-INR
INR: 1.14
Prothrombin Time: 14.6 seconds (ref 11.4–15.2)

## 2016-11-16 MED ORDER — WARFARIN SODIUM 4 MG PO TABS
4.0000 mg | ORAL_TABLET | Freq: Once | ORAL | Status: AC
  Administered 2016-11-16: 4 mg via ORAL
  Filled 2016-11-16: qty 1

## 2016-11-16 NOTE — Evaluation (Signed)
Occupational Therapy Evaluation Patient Details Name: [name] MRN: [id number] DOB: 03/27/1936 Today's Date: 11/16/2016    History of Present Illness 80 y.o. female admitted to [facility] on 11/15/16 for elective R TKA.  Pt with significant PMHx of sciatica L, paresthesia (hands), HTN, glaucoma, DVT, lumbar disc disease, R foot surgery, back surgery, cervical fusion.     Clinical Impression   PTA Pt independent in ADL and mobility. Pt currently max assist for ADL and +2 assist for mobility (stand pivot) with RW. Suspect that current status is due to lethargy caused by medication. Please see performance level below. Pt will benefit from skilled OT in the acute care setting prior to d/c home with good family support. Next session to address LB ADL and attempt standing grooming tasks.     Follow Up Recommendations  No OT follow up;Supervision/Assistance - 24 hour    Equipment Recommendations  3 in 1 bedside commode    Recommendations for Other Services       Precautions / Restrictions Precautions Precautions: Knee Precaution Booklet Issued: Yes (comment) Precaution Comments: reviewed no pillow under knee Required Braces or Orthoses: Knee Immobilizer - Right (not used this session) Restrictions Weight Bearing Restrictions: Yes RLE Weight Bearing: Weight bearing as tolerated      Mobility Bed Mobility Overal bed mobility: Needs Assistance Bed Mobility: Supine to Sit     Supine to sit: Mod assist;HOB elevated     General bed mobility comments: assist for BLE to EOB, and assist to lift trunk, use of bed pad to bring hips EOB.   Transfers Overall transfer level: Needs assistance Equipment used: Rolling walker (2 wheeled) Transfers: Sit to/from Stand Sit to Stand: Min assist;+2 safety/equipment         General transfer comment: Min a for safety from recliner with second person for safety with standing initially    Balance Overall balance assessment: Needs  assistance Sitting-balance support: Feet supported;Bilateral upper extremity supported Sitting balance-Leahy Scale: Fair Sitting balance - Comments: sitting EOB with no back support Postural control: Posterior lean Standing balance support: Bilateral upper extremity supported Standing balance-Leahy Scale: Poor Standing balance comment: two person min assist with RW to maintain standing.                             ADL Overall ADL's : Needs assistance/impaired Eating/Feeding: Set up;Sitting   Grooming: Wash/dry face;Oral care;Set up;Sitting Grooming Details (indicate cue type and reason): in recliner Upper Body Bathing: Minimal assistance;Sitting   Lower Body Bathing: Maximal assistance;Sitting/lateral leans   Upper Body Dressing : Minimal assistance;Sitting   Lower Body Dressing: Total assistance;Sit to/from stand   Toilet Transfer: Moderate assistance;+2 for physical assistance;+2 for safety/equipment;Stand-pivot;BSC;RW;Cueing for sequencing Toilet Transfer Details (indicate cue type and reason): Pt required max verbal cues to move feet, and for attention to task for stand pivot. Toileting- Clothing Manipulation and Hygiene: Maximal assistance;Cueing for sequencing;Cueing for safety;Sit to/from stand Toileting - Clothing Manipulation Details (indicate cue type and reason): verbal cues for attention to task, and assist with underwear       General ADL Comments: Pt limited by lethargy suspect to medication     Vision     Perception     Praxis      Pertinent Vitals/Pain Pain Assessment: 0-10 Pain Score: 8  Pain Location: right knee Pain Descriptors / Indicators: Aching;Burning Pain Intervention(s): Limited activity within patient's tolerance;Repositioned;Ice applied     Hand Dominance Right  Extremity/Trunk Assessment Upper Extremity Assessment Upper Extremity Assessment: Generalized weakness   Lower Extremity Assessment Lower Extremity Assessment:  LLE deficits/detail LLE Deficits / Details: left leg with normal post op pain and weakness   Cervical / Trunk Assessment Cervical / Trunk Assessment: Other exceptions Cervical / Trunk Exceptions: h/o back surgery and neck surgery   Communication Communication Communication: No difficulties   Cognition Arousal/Alertness: Lethargic;Suspect due to medications Behavior During Therapy: [facility] for tasks assessed/performed Overall Cognitive Status: Impaired/Different from baseline                 General Comments: likely due to pain meds, pt is very hard to keep aroused. Daughter states it is better than this morning.   General Comments       Exercises      Shoulder Instructions      Home Living Family/patient expects to be discharged to:: Private residence Living Arrangements: Alone (with a cat) Available Help at Discharge: Family;Available 24 hours/day (daughter for at least a week) Type of Home: House Home Access: Stairs to enter [employer] of Steps: 1 Entrance Stairs-Rails: None Home Layout: One level     Bathroom Shower/Tub: Tub/shower unit Shower/tub characteristics: Curtain Biochemist, clinical: Handicapped height Bathroom Accessibility: Yes   Home Equipment: [place] - single point;Walker - 2 wheels;Other (comment);Shower seat (has access to one, neighbor is bringing shower seat)          Prior Functioning/Environment Level of Independence: Independent with assistive device(s)        Comments: used cane both inside and outside PTA, still drives, retired        OT Problem List: Decreased strength;Decreased range of motion;Decreased activity tolerance;Impaired balance (sitting and/or standing);Decreased safety awareness;Decreased knowledge of use of DME or AE;Pain   OT Treatment/Interventions: Self-care/ADL training;DME and/or AE instruction;Patient/family education;Balance training    OT Goals(Current goals can be found in the care plan section) Acute  Rehab OT Goals Patient Stated Goal: to go home OT Goal Formulation: With patient Time For Goal Achievement: 11/23/16 Potential to Achieve Goals: Good  OT Frequency: Min 2X/week   Barriers to D/C:            Co-evaluation              End of Session Equipment Utilized During Treatment: Gait belt;Rolling walker;Oxygen CPM Right Knee CPM Right Knee: Off Nurse Communication: Mobility status  Activity Tolerance: Patient limited by lethargy Patient left: in chair;with call bell/phone within reach;with family/visitor present   Time: [id number] OT Time Calculation (min): 41 min Charges:  OT General Charges $OT Visit: 1 Procedure OT Evaluation $OT Eval Moderate Complexity: 1 Procedure OT Treatments $Self Care/Home Management : 23-37 mins G-Codes:    Merri Ray Tasnim Balentine Dec 06, 2016, 4:21 PM Hulda Humphrey OTR/L [phone]

## 2016-11-16 NOTE — Progress Notes (Signed)
Physical Therapy Treatment Patient Details Name: [name] MRN: [id number] DOB: 1936-11-13 Today's Date: 11/16/2016    History of Present Illness 80 y.o. female admitted to [facility] on 11/15/16 for elective R TKA.  Pt with significant PMHx of sciatica L, paresthesia (hands), HTN, glaucoma, DVT, lumbar disc disease, R foot surgery, back surgery, cervical fusion.      PT Comments    Significant more help needed today, but pt falling asleep on her feet while we are attempting to walk.  I think as we find the correct balance of pain meds and pain tolerance, pt will progress back to where she was last night (min assist with RW).  Until then, PT will continue to follow acutely and progress as able.    Follow Up Recommendations  Home health PT;Supervision for mobility/OOB     Equipment Recommendations  None recommended by PT    Recommendations for Other Services   NA     Precautions / Restrictions Precautions Precautions: Knee Precaution Booklet Issued: Yes (comment) Precaution Comments: knee exercise handout given Required Braces or Orthoses: Knee Immobilizer - Right (used today) Restrictions RLE Weight Bearing: Weight bearing as tolerated    Mobility  Bed Mobility Overal bed mobility: Needs Assistance Bed Mobility: Supine to Sit     Supine to sit: Mod assist;HOB elevated     General bed mobility comments: Mod assist to help progress right leg to EOB and mod assist to support trunk to pull to sitting as well as progress hips out  Transfers Overall transfer level: Needs assistance Equipment used: Rolling walker (2 wheeled) Transfers: Sit to/from Stand Sit to Stand: Mod assist;+2 physical assistance         General transfer comment: two person mod assist to stand from bed and BSC in bathroom.  Pt squatting but not coming all the way up.    Ambulation/Gait Ambulation/Gait assistance: Mod assist;+2 physical assistance Ambulation Distance (Feet): 15 Feet Assistive device:  Rolling walker (2 wheeled) Gait Pattern/deviations: Step-to pattern;Antalgic Gait velocity: decreased Gait velocity interpretation: <1.8 ft/sec, indicative of risk for recurrent falls General Gait Details: Two person mod assist to support trunk over flexed legs bil.  Max verbal cues to stand up in RW and for LE sequencing.  Pt keeps closing her eyes and sinking down into the RW.  We almost did not make it to the bathroom.            Balance Overall balance assessment: Needs assistance Sitting-balance support: Feet supported;Bilateral upper extremity supported Sitting balance-Leahy Scale: Poor   Postural control: Posterior lean Standing balance support: Bilateral upper extremity supported Standing balance-Leahy Scale: Poor Standing balance comment: two person mod assist with RW to maintain standing.                     Cognition Arousal/Alertness: Lethargic;Suspect due to medications Behavior During Therapy: [facility] for tasks assessed/performed Overall Cognitive Status: Impaired/Different from baseline                 General Comments: likely due to pain meds, pt is very hard to keep aroused    Exercises Total Joint Exercises Ankle Circles/Pumps: AROM;Both;20 reps Quad Sets: AROM;Right;10 reps Towel Squeeze: AROM;Both;10 reps Heel Slides: AAROM;Right;10 reps        Pertinent Vitals/Pain Pain Assessment: 0-10 Pain Score: 5  Pain Location: right knee Pain Descriptors / Indicators: Aching;Burning Pain Intervention(s): Limited activity within patient's tolerance;Monitored during session;Repositioned           PT  Goals (current goals can now be found in the care plan section) Acute Rehab PT Goals Patient Stated Goal: to go home Progress towards PT goals: Progressing toward goals    Frequency    7X/week      PT Plan Current plan remains appropriate       End of Session Equipment Utilized During Treatment: Gait belt Activity Tolerance: Patient  limited by pain;Patient limited by lethargy Patient left: in chair;with call bell/phone within reach;with family/visitor present     Time: [id number] PT Time Calculation (min) (ACUTE ONLY): 54 min  Charges:  $Gait Training: 23-37 mins $Therapeutic Exercise: 8-22 mins $Therapeutic Activity: 8-22 mins                      Clotilda Hafer B. Baird Polinski, PT, DPT [phone]   11/16/2016, 9:37 AM

## 2016-11-16 NOTE — Progress Notes (Signed)
Orthopedics Progress Note  Subjective: Patient complained of severe pain earlier this morning, easing off now  Objective:  Vitals:   11/16/16 0026 11/16/16 0447  BP: 136/66 (!) 141/64  Pulse: 84 81  Resp: 16 16  Temp: 98 F (36.7 C) 98.3 F (36.8 C)    General: Awake and alert  Musculoskeletal: right knee dressing CDI, good calf pumps Neurovascularly intact  Lab Results  Component Value Date   WBC 10.2 11/16/2016   HGB 12.4 11/16/2016   HCT 38.5 11/16/2016   MCV 96.5 11/16/2016   PLT 202 11/16/2016       Component Value Date/Time   NA 138 11/16/2016 0519   K 4.2 11/16/2016 0519   K 3.6 12/03/2011 1203   CL 103 11/16/2016 0519   CO2 27 11/16/2016 0519   GLUCOSE 113 (H) 11/16/2016 0519   GLUCOSE 88 09/09/2006 1613   BUN 12 11/16/2016 0519   CREATININE 0.60 11/16/2016 0519   CALCIUM 8.8 (L) 11/16/2016 0519   GFRNONAA >60 11/16/2016 0519   GFRAA >60 11/16/2016 0519    Lab Results  Component Value Date   INR 1.14 11/16/2016   INR 1.09 11/15/2016    Assessment/Plan: POD #1 s/p Procedure(s): RIGHT TOTAL KNEE ARTHROPLASTY Stable overnight, patient a little groggy from medicine PT, OT  OOB DVT prophylaxis with Coumadin and Mechanical Lovenox bridge  Doran Heater. Veverly Fells, MD 11/16/2016 9:37 AM

## 2016-11-16 NOTE — Progress Notes (Signed)
ANTICOAGULATION CONSULT NOTE - Follow Up Consult  Pharmacy Consult for Coumadin  Indication: VTE prophylaxis s/p right TKA  Allergies  Allergen Reactions  . Moxifloxacin Other (See Comments)    YEAST SUPERINFECTION  . Penicillins Itching and Other (See Comments)    YEAST SUPERINFECTION DUE TO PCN USE  Has patient had a PCN reaction causing immediate rash, facial/tongue/throat swelling, SOB or lightheadedness with hypotension: No Has patient had a PCN reaction causing severe rash involving mucus membranes or skin necrosis: No Has patient had a PCN reaction that required hospitalization No Has patient had a PCN reaction occurring within the last 10 years: No If all of the above answers are "NO", then may proceed with Cephalosporin use.   . Quinine Derivatives Other (See Comments)    DIZZY "CRAZY"  . Pramipexole Dihydrochloride     UNSPECIFIED REACTION   . Ropinirole Hydrochloride Other (See Comments)    UNSPECIFIED REACTION   . Codeine Nausea And Vomiting    Patient Measurements: Height: 5' 5.5" (166.4 cm) Weight: 181 lb 3.5 oz (82.2 kg) IBW/kg (Calculated) : 58.15  Vital Signs: Temp: 98.3 F (36.8 C) (12/30 0447) Temp Source: Oral (12/30 0447) BP: 141/64 (12/30 0447) Pulse Rate: 81 (12/30 0447)  Labs:  Recent Labs  11/15/16 1614 11/15/16 1930 11/16/16 0519  HGB 13.3  --  12.4  HCT 41.7  --  38.5  PLT 187  --  202  LABPROT  --  14.1 14.6  INR  --  1.09 1.14  CREATININE 0.68  --  0.60    Estimated Creatinine Clearance: 62 mL/min (by C-G formula based on SCr of 0.6 mg/dL).   Medications:  Coumadin 5mg  given 12/29  Assessment: 80 year old female s/p TKA on Lovenox bridge to Coumadin for VTE prophylaxis. Her INR is essentially unchanged as would be expected after only 1 dose of Coumadin. No bleeding noted. Note fluconazole was resumed from her prior to admission meds and this has a significant interaction with Coumadin. It appears this is an as needed  medication so I spoke with her regarding need - she states she needs it so I will adjust her Coumadin dose down.  Goal of Therapy:  INR 2-3 Monitor platelets by anticoagulation protocol: Yes   Plan:  Coumadin 4mg  today Daily PT/INR  Legrand Como, Pharm.D., BCPS, AAHIVP Clinical Pharmacist Phone: [phone] or [phone] Pager: [phone] 11/16/2016, 11:59 AM

## 2016-11-16 NOTE — Progress Notes (Signed)
Physical Therapy Treatment Patient Details Name: [name] MRN: [id number] DOB: 09-02-1936 Today's Date: 11/16/2016    History of Present Illness 80 y.o. female admitted to [facility] on 11/15/16 for elective R TKA.  Pt with significant PMHx of sciatica L, paresthesia (hands), HTN, glaucoma, DVT, lumbar disc disease, R foot surgery, back surgery, cervical fusion.      PT Comments    Pt continues to require increased assistance for gait this second session. Pt is a little more alert this session, but still requires cues to stay awake throughout session. Performed gait from recliner to door with chair follow and Mod A for stability and cues for maneuvering Rw. Pt had increased difficulty sitting back in chair and requires MAX cues for safety to sit in recliner. Pt may perform better with change in medications. O2 sats were above 90% following gait without O2 in place this session. Rn notified O2 was left off for now.    Follow Up Recommendations  Home health PT;Supervision for mobility/OOB     Equipment Recommendations  None recommended by PT    Recommendations for Other Services       Precautions / Restrictions Precautions Precautions: Knee Precaution Booklet Issued: Yes (comment) Precaution Comments: reviewed no pillow under knee Required Braces or Orthoses: Knee Immobilizer - Right Restrictions Weight Bearing Restrictions: Yes RLE Weight Bearing: Weight bearing as tolerated    Mobility  Bed Mobility               General bed mobility comments: Recieved in recliner  Transfers Overall transfer level: Needs assistance Equipment used: Rolling walker (2 wheeled) Transfers: Sit to/from Stand Sit to Stand: Min assist;+2 safety/equipment         General transfer comment: Min a for safety from recliner with second person for safety with standing initially  Ambulation/Gait Ambulation/Gait assistance: Mod assist;+2 physical assistance Ambulation Distance (Feet): 25  Feet Assistive device: Rolling walker (2 wheeled) Gait Pattern/deviations: Step-to pattern;Antalgic Gait velocity: decreased Gait velocity interpretation: Below normal speed for age/gender General Gait Details: Two person Mod a to support trunk and assist with weight bearing through RLE. Pt moving walker too far forward from BOS and requires assistance to maintain walker in front of her. Performed gait to door and then requires max a and cues to sit safety in recliner as pt is leaning forward.    Stairs            Wheelchair Mobility    Modified Rankin (Stroke Patients Only)       Balance Overall balance assessment: Needs assistance Sitting-balance support: Feet supported;Bilateral upper extremity supported Sitting balance-Leahy Scale: Poor   Postural control: Posterior lean Standing balance support: Bilateral upper extremity supported Standing balance-Leahy Scale: Poor Standing balance comment: two person min assist with RW to maintain standing.                     Cognition Arousal/Alertness: Lethargic;Suspect due to medications Behavior During Therapy: [facility] for tasks assessed/performed Overall Cognitive Status: Impaired/Different from baseline                 General Comments: Likely due to medications, pt is more alert but still falls asleep easily.    Exercises Total Joint Exercises Ankle Circles/Pumps: AROM;Both;20 reps Quad Sets: AROM;Right;10 reps    General Comments        Pertinent Vitals/Pain Pain Assessment: 0-10 Pain Score: 8  Pain Location: right knee Pain Descriptors / Indicators: Aching;Burning Pain Intervention(s): Limited activity within  patient's tolerance;Monitored during session;Repositioned;Ice applied    Home Living Family/patient expects to be discharged to:: Private residence Living Arrangements: Alone (with a cat) Available Help at Discharge: Family;Available 24 hours/day (daughter for at least a week) Type of Home:  House Home Access: Stairs to enter Entrance Stairs-Rails: None Home Layout: One level Home Equipment: Cane - single point;Walker - 2 wheels;Other (comment);Shower seat (has access to one, neighbor is bringing shower seat)      Prior Function Level of Independence: Independent with assistive device(s)      Comments: used cane both inside and outside PTA, still drives, retired   PT Goals (current goals can now be found in the care plan section) Acute Rehab PT Goals Patient Stated Goal: to go home Progress towards PT goals: Progressing toward goals    Frequency    7X/week      PT Plan Current plan remains appropriate    Co-evaluation             End of Session Equipment Utilized During Treatment: Gait belt;Right knee immobilizer Activity Tolerance: Patient limited by pain;Patient limited by lethargy Patient left: in chair;with call bell/phone within reach;with family/visitor present     Time: [id number] PT Time Calculation (min) (ACUTE ONLY): 29 min  Charges:  $Gait Training: 23-37 mins                    G Codes:      Scheryl Marten PT, DPT  [phone]  11/16/2016, 3:58 PM

## 2016-11-16 NOTE — Plan of Care (Signed)
Problem: Pain Managment: Goal: General experience of comfort will improve Outcome: Progressing Medicated twice for pain this shift with moderate relief  Problem: Tissue Perfusion: Goal: Risk factors for ineffective tissue perfusion will decrease Outcome: Progressing Denies signs and symptoms of dvt  Problem: Activity: Goal: Risk for activity intolerance will decrease Outcome: Progressing OOB to chair and sat for hours and tolerated well  Problem: Bowel/Gastric: Goal: Will not experience complications related to bowel motility Outcome: Progressing Denies gastric and bowel issues

## 2016-11-17 ENCOUNTER — Other Ambulatory Visit: Payer: Self-pay | Admitting: Family Medicine

## 2016-11-17 LAB — CBC
HEMATOCRIT: 38.7 % (ref 36.0–46.0)
HEMOGLOBIN: 12.3 g/dL (ref 12.0–15.0)
MCH: 31.1 pg (ref 26.0–34.0)
MCHC: 31.8 g/dL (ref 30.0–36.0)
MCV: 97.7 fL (ref 78.0–100.0)
PLATELETS: 197 10*3/uL (ref 150–400)
RBC: 3.96 MIL/uL (ref 3.87–5.11)
RDW: 13.2 % (ref 11.5–15.5)
WBC: 11.3 10*3/uL — ABNORMAL HIGH (ref 4.0–10.5)

## 2016-11-17 LAB — URINALYSIS, ROUTINE W REFLEX MICROSCOPIC
BILIRUBIN URINE: NEGATIVE
Glucose, UA: NEGATIVE mg/dL
KETONES UR: NEGATIVE mg/dL
Leukocytes, UA: NEGATIVE
Nitrite: NEGATIVE
Protein, ur: 100 mg/dL — AB
Specific Gravity, Urine: 1.009 (ref 1.005–1.030)
pH: 6 (ref 5.0–8.0)

## 2016-11-17 LAB — PROTIME-INR
INR: 1.29
Prothrombin Time: 16.2 seconds — ABNORMAL HIGH (ref 11.4–15.2)

## 2016-11-17 MED ORDER — WARFARIN SODIUM 4 MG PO TABS
4.0000 mg | ORAL_TABLET | Freq: Once | ORAL | Status: AC
  Administered 2016-11-17: 4 mg via ORAL
  Filled 2016-11-17: qty 1

## 2016-11-17 NOTE — Plan of Care (Signed)
Problem: Safety: Goal: Ability to remain free from injury will improve Outcome: Progressing Safety precautions and fall preventions maintained   Problem: Physical Regulation: Goal: Will remain free from infection Outcome: Progressing No s/s of infection  Problem: Skin Integrity: Goal: Risk for impaired skin integrity will decrease Outcome: Progressing No skin issues noted  Problem: Tissue Perfusion: Goal: Risk factors for ineffective tissue perfusion will decrease Outcome: Progressing Denies s/s of dvt  Problem: Activity: Goal: Risk for activity intolerance will decrease Outcome: Not Progressing Very weak states that she feels very tired  Problem: Bowel/Gastric: Goal: Will not experience complications related to bowel motility Outcome: Progressing Denies gastric and bowel issues this shift

## 2016-11-17 NOTE — Care Management Note (Signed)
Case Management Note  Patient Details  Name: ISABELLAMARIE MELLIES MRN: [id number] Date of Birth: 03-Jul-1936  Subjective/Objective:                  (Right) [employer]  Action/Plan: Discharge planning Expected Discharge Date:  [id number]              Expected Discharge Plan:  [place]  In-House Referral:     Discharge planning Services  CM Consult  Post Acute Care Choice:  Home Health Choice offered to:  Patient, Adult Children  DME Arranged:  3-N-1, CPM, Walker rolling DME Agency:  [place]:  PT [facility] Agency:  Other - See comment  Status of Service:  Completed, signed off  If discussed at [place] of Stay Meetings, dates discussed:    Additional Comments: CM spoke with pt and daughter of pt for choice and family choose [facility] to render HHPT/Rn (INR checks).  Referral called to [facility] rep, [place].  CM has requested Home Health orders and face to face from MD.  Family states [employer] is providing CPM, 3n1, and rolling walker.  No other CM needs were communicated. Dellie Catholic, RN 11/17/2016, 9:16 AM

## 2016-11-17 NOTE — Progress Notes (Signed)
Physical Therapy Treatment Patient Details Name: [name] MRN: [id number] DOB: 1936-04-09 Today's Date: 11/17/2016    History of Present Illness 80 y.o. female admitted to [facility] on 11/15/16 for elective R TKA.  Pt with significant PMHx of sciatica L, paresthesia (hands), HTN, glaucoma, DVT, lumbar disc disease, R foot surgery, back surgery, cervical fusion.      PT Comments    Pt presents with improved tolerance for gait this session. Improved alertness and ability to participate, but is limited by pain. Pt requires +2 for gait to bathroom but only +1 from bathroom to recliner. Overall improved mobility as compared to yesterday.   Follow Up Recommendations  Home health PT;Supervision for mobility/OOB     Equipment Recommendations  None recommended by PT    Recommendations for Other Services       Precautions / Restrictions Precautions Precautions: Knee Precaution Booklet Issued: Yes (comment) Precaution Comments: reviewed no pillow under knee Required Braces or Orthoses: Knee Immobilizer - Right (Did not use this session) Restrictions Weight Bearing Restrictions: Yes RLE Weight Bearing: Weight bearing as tolerated    Mobility  Bed Mobility Overal bed mobility: Needs Assistance Bed Mobility: Supine to Sit     Supine to sit: Min assist;HOB elevated     General bed mobility comments: Assist with LEs EOB, assistance to lift trunk and use of pad to bring hips EOB  Transfers Overall transfer level: Needs assistance Equipment used: Rolling walker (2 wheeled) Transfers: Sit to/from Stand Sit to Stand: Min assist;+2 safety/equipment         General transfer comment: MIn A for safety from EOB and commode  Ambulation/Gait Ambulation/Gait assistance: Min assist;+2 safety/equipment Ambulation Distance (Feet): 30 Feet Assistive device: Rolling walker (2 wheeled) Gait Pattern/deviations: Step-to pattern;Antalgic Gait velocity: decreased Gait velocity interpretation:  Below normal speed for age/gender General Gait Details: Min a +2 from bed to bathroom, Min A +1 from bathroom to recliner.    Stairs            Wheelchair Mobility    Modified Rankin (Stroke Patients Only)       Balance                                    Cognition Arousal/Alertness: Awake/alert Behavior During Therapy: WFL for tasks assessed/performed Overall Cognitive Status: Within Functional Limits for tasks assessed                      Exercises      General Comments        Pertinent Vitals/Pain Pain Assessment: 0-10 Pain Score: 8  Pain Location: right knee Pain Descriptors / Indicators: Aching;Burning Pain Intervention(s): Monitored during session;Patient requesting pain meds-RN notified;Ice applied    Home Living                      Prior Function            PT Goals (current goals can now be found in the care plan section) Acute Rehab PT Goals Patient Stated Goal: to go home Progress towards PT goals: Progressing toward goals    Frequency    7X/week      PT Plan Current plan remains appropriate    Co-evaluation             End of Session Equipment Utilized During Treatment: Gait belt Activity Tolerance: Patient limited by pain  Patient left: in chair;with call bell/phone within reach;with family/visitor present     Time: 0912-0941 PT Time Calculation (min) (ACUTE ONLY): 29 min  Charges:  $Gait Training: 23-37 mins                    G Codes:      Scheryl Marten PT, DPT  [phone]  11/17/2016, 9:49 AM

## 2016-11-17 NOTE — Progress Notes (Signed)
Orthopedic Tech Progress Note Patient Details:  [name] 19-Nov-1935 [id number]  Patient ID: [name], female   DOB: 12/24/1935, 80 y.o.   MRN: [id number] Applied cpm 0-55. Pt was having to much pain to go any higher.  Karolee Stamps 11/17/2016, 6:12 AM

## 2016-11-17 NOTE — Care Management Note (Deleted)
Case Management Note  Patient Details  Name: [name] MRN: [id number] Date of Birth: Aug 18, 1936  Subjective/Objective:                    Action/Plan:   Expected Discharge Date:                  Expected Discharge Plan:  [place]  In-House Referral:     Discharge planning Services  CM Consult  Post Acute Care Choice:  Home Health Choice offered to:  Patient, Adult Children  DME Arranged:  3-N-1, CPM, Walker rolling DME Agency:  [place]:  PT [facility] Agency:  Other - See comment  Status of Service:  Completed, signed off  If discussed at [place] of Stay Meetings, dates discussed:    Additional Comments:  Dellie Catholic, RN 11/17/2016, 9:23 AM

## 2016-11-17 NOTE — Progress Notes (Signed)
ANTICOAGULATION CONSULT NOTE - Follow Up Consult  Pharmacy Consult for Coumadin  Indication: VTE prophylaxis s/p right TKA  Allergies  Allergen Reactions  . Moxifloxacin Other (See Comments)    YEAST SUPERINFECTION  . Penicillins Itching and Other (See Comments)    YEAST SUPERINFECTION DUE TO PCN USE  Has patient had a PCN reaction causing immediate rash, facial/tongue/throat swelling, SOB or lightheadedness with hypotension: No Has patient had a PCN reaction causing severe rash involving mucus membranes or skin necrosis: No Has patient had a PCN reaction that required hospitalization No Has patient had a PCN reaction occurring within the last 10 years: No If all of the above answers are "NO", then may proceed with Cephalosporin use.   . Quinine Derivatives Other (See Comments)    DIZZY "CRAZY"  . Pramipexole Dihydrochloride     UNSPECIFIED REACTION   . Ropinirole Hydrochloride Other (See Comments)    UNSPECIFIED REACTION   . Codeine Nausea And Vomiting    Patient Measurements: Height: 5' 5.5" (166.4 cm) Weight: 181 lb 3.5 oz (82.2 kg) IBW/kg (Calculated) : 58.15  Vital Signs: Temp: 99 F (37.2 C) (12/31 0429) Temp Source: Oral (12/31 0429) BP: 145/74 (12/31 0429) Pulse Rate: 82 (12/31 0429)  Labs:  Recent Labs  11/15/16 1614 11/15/16 1930 11/16/16 0519 11/17/16 0343  HGB 13.3  --  12.4 12.3  HCT 41.7  --  38.5 38.7  PLT 187  --  202 197  LABPROT  --  14.1 14.6 16.2*  INR  --  1.09 1.14 1.29  CREATININE 0.68  --  0.60  --     Estimated Creatinine Clearance: 62 mL/min (by C-G formula based on SCr of 0.6 mg/dL).   Medications:  Coumadin 5mg  given 12/29  Assessment: 80 year old female s/p TKA on Lovenox bridge to Coumadin for VTE prophylaxis. Her INR is starting to increase as would be expected on Day 3 of Coumadin. No bleeding noted. Note fluconazole was resumed from her prior to admission meds and this has a significant interaction with Coumadin. It  appears this is an as needed medication so I spoke with her regarding need - she states she needs it so I continue with a slightly decreased dose of Coumadin.  Goal of Therapy:  INR 2-3 Monitor platelets by anticoagulation protocol: Yes   Plan:  Coumadin 4mg  today Daily PT/INR  Legrand Como, Pharm.D., BCPS, AAHIVP Clinical Pharmacist Phone: [phone] or [phone] Pager: [phone] 11/17/2016, 11:20 AM

## 2016-11-17 NOTE — Progress Notes (Addendum)
Orthopedics Progress Note  Subjective: Patient reports ongoing pain that is barely controlled with oral pain meds, no BM  Objective:  Vitals:   11/16/16 2013 11/17/16 0429  BP: (!) 146/58 (!) 145/74  Pulse: 82 82  Resp: 16   Temp: 99.8 F (37.7 C) 99 F (37.2 C)    General: Awake and alert  Musculoskeletal: incision looks good, no swelling, min erythema Neurovascularly intact  Lab Results  Component Value Date   WBC 11.3 (H) 11/17/2016   HGB 12.3 11/17/2016   HCT 38.7 11/17/2016   MCV 97.7 11/17/2016   PLT 197 11/17/2016       Component Value Date/Time   NA 138 11/16/2016 0519   K 4.2 11/16/2016 0519   K 3.6 12/03/2011 1203   CL 103 11/16/2016 0519   CO2 27 11/16/2016 0519   GLUCOSE 113 (H) 11/16/2016 0519   GLUCOSE 88 09/09/2006 1613   BUN 12 11/16/2016 0519   CREATININE 0.60 11/16/2016 0519   CALCIUM 8.8 (L) 11/16/2016 0519   GFRNONAA >60 11/16/2016 0519   GFRAA >60 11/16/2016 0519    Lab Results  Component Value Date   INR 1.29 11/17/2016   INR 1.14 11/16/2016   INR 1.09 11/15/2016    Assessment/Plan: POD #2 s/p Procedure(s): RIGHT TOTAL KNEE ARTHROPLASTY Stable overnight. Continue OOB and mobilization INR still sub-therapeutic, continue Lovenox PT, OT, D/C planning - possible D/C tomorrow Nursing reporting urinary frequency - will check a UA  Remo Lipps R. Veverly Fells, MD 11/17/2016 7:53 AM

## 2016-11-17 NOTE — Progress Notes (Signed)
Physical Therapy Treatment Patient Details Name: [name] MRN: [id number] DOB: Jun 20, 1936 Today's Date: 11/17/2016    History of Present Illness 80 y.o. female admitted to [facility] on 11/15/16 for elective R TKA.  Pt with significant PMHx of sciatica L, paresthesia (hands), HTN, glaucoma, DVT, lumbar disc disease, R foot surgery, back surgery, cervical fusion.      PT Comments    Pt continues to be moving better with therapy this session. Knee flexion improved to 70 degrees in sitting. Performed LE strengthening exercises and pt is able to perform SLR with min A to maintain knee extension. Pt continues to require cues for step length and proper positioning inside Rw. Prior to Dc, will need to perform 1 step to get into the house.    Follow Up Recommendations  Home health PT;Supervision for mobility/OOB     Equipment Recommendations  None recommended by PT    Recommendations for Other Services       Precautions / Restrictions Precautions Precautions: Knee Precaution Booklet Issued: Yes (comment) Precaution Comments: reviewed no pillow under knee Required Braces or Orthoses: Knee Immobilizer - Right (Did not use, pt knee is stable) Restrictions Weight Bearing Restrictions: Yes RLE Weight Bearing: Weight bearing as tolerated    Mobility  Bed Mobility Overal bed mobility: Needs Assistance Bed Mobility: Supine to Sit;Sit to Supine     Supine to sit: Min assist;HOB elevated Sit to supine: Min assist   General bed mobility comments: assist with LE's into and out of bed. Able to lift trunk into and out of bed without assistance. HOB down to maneuver self up in bed with use of railings.  Transfers Overall transfer level: Needs assistance Equipment used: Rolling walker (2 wheeled) Transfers: Sit to/from Stand Sit to Stand: Min assist         General transfer comment: MIn A for safety from EOB and commode  Ambulation/Gait Ambulation/Gait assistance: Min assist Ambulation  Distance (Feet): 20 Feet Assistive device: Rolling walker (2 wheeled) Gait Pattern/deviations: Step-to pattern;Antalgic Gait velocity: decreased Gait velocity interpretation: Below normal speed for age/gender General Gait Details: Min A from bed to bathroom and back to bed. pt continues to require cues for increased step length RLE and staying within walker properly.    Stairs            Wheelchair Mobility    Modified Rankin (Stroke Patients Only)       Balance Overall balance assessment: Needs assistance Sitting-balance support: No upper extremity supported;Feet supported Sitting balance-Leahy Scale: Good Sitting balance - Comments: sitting EOB with no back support   Standing balance support: Bilateral upper extremity supported Standing balance-Leahy Scale: Poor Standing balance comment: relies on RW to stabilzie this session                    Cognition Arousal/Alertness: Awake/alert Behavior During Therapy: WFL for tasks assessed/performed Overall Cognitive Status: Within Functional Limits for tasks assessed                      Exercises Total Joint Exercises Ankle Circles/Pumps: AROM;Both;20 reps Quad Sets: AROM;Right;10 reps Heel Slides: AAROM;Right;10 reps Straight Leg Raises: AAROM;Left;10 reps;Supine Goniometric ROM: 5-70    General Comments        Pertinent Vitals/Pain Pain Assessment: 0-10 Pain Score: 6  Pain Location: right knee Pain Descriptors / Indicators: Aching;Guarding;Sore Pain Intervention(s): Monitored during session;Ice applied;Limited activity within patient's tolerance    Home Living  Prior Function            PT Goals (current goals can now be found in the care plan section) Acute Rehab PT Goals Patient Stated Goal: to go home Progress towards PT goals: Progressing toward goals    Frequency    7X/week      PT Plan Current plan remains appropriate    Co-evaluation              End of Session Equipment Utilized During Treatment: Gait belt Activity Tolerance: Patient tolerated treatment well;Patient limited by fatigue Patient left: in bed;with call bell/phone within reach;in CPM;with family/visitor present     Time: [id number] PT Time Calculation (min) (ACUTE ONLY): 40 min  Charges:  $Gait Training: 8-22 mins $Therapeutic Exercise: 8-22 mins $Therapeutic Activity: 8-22 mins                    G Codes:      Scheryl Marten PT, DPT  [phone]  11/17/2016, 3:06 PM

## 2016-11-18 ENCOUNTER — Encounter (HOSPITAL_COMMUNITY): Payer: Self-pay

## 2016-11-18 LAB — CBC
HEMATOCRIT: 35.3 % — AB (ref 36.0–46.0)
HEMOGLOBIN: 11.4 g/dL — AB (ref 12.0–15.0)
MCH: 31.6 pg (ref 26.0–34.0)
MCHC: 32.3 g/dL (ref 30.0–36.0)
MCV: 97.8 fL (ref 78.0–100.0)
Platelets: 172 10*3/uL (ref 150–400)
RBC: 3.61 MIL/uL — ABNORMAL LOW (ref 3.87–5.11)
RDW: 13.4 % (ref 11.5–15.5)
WBC: 9.1 10*3/uL (ref 4.0–10.5)

## 2016-11-18 LAB — PROTIME-INR
INR: 1.36
Prothrombin Time: 16.9 seconds — ABNORMAL HIGH (ref 11.4–15.2)

## 2016-11-18 MED ORDER — WARFARIN SODIUM 6 MG PO TABS
6.0000 mg | ORAL_TABLET | Freq: Once | ORAL | Status: AC
  Administered 2016-11-18: 6 mg via ORAL
  Filled 2016-11-18: qty 1

## 2016-11-18 NOTE — Progress Notes (Addendum)
Physical Therapy Treatment Patient Details Name: KAILINA KALAFUT MRN: [id number] DOB: [birth year] Today's Date: 11/18/2016    History of Present Illness 81 y.o. female admitted to [facility] on 11/15/16 for elective R TKA.  Pt with significant PMHx of sciatica L, paresthesia (hands), HTN, glaucoma, DVT, lumbar disc disease, R foot surgery, back surgery, cervical fusion.      PT Comments    Pt with poor progression toward goals,  She is now requiring mod assist and presents as a high fall risk.  Pt would benefit from Short Term SNF placement to improve strength and promote functional independence before returning home.  Spoke with patient's daughter whom she will be going home with and at this point daughter feels as though she cannot manage her mother at this time.  Will inform nursing, supervising PT and case management of change in recommendations.    Follow Up Recommendations  SNF     Equipment Recommendations  Other (comment) (if patient refuses SNF placement will need to max out HHPT services for [facility] aid and hospital bed.  )    Recommendations for Other Services       Precautions / Restrictions Precautions Precautions: Knee Precaution Comments: reviewed no pillow under knee Required Braces or Orthoses: Knee Immobilizer - Right (did not use) Restrictions Weight Bearing Restrictions: No RLE Weight Bearing: Weight bearing as tolerated    Mobility  Bed Mobility Overal bed mobility: Needs Assistance Bed Mobility: Supine to Sit     Supine to sit: Mod assist;HOB elevated (Heavy use of bed rail and assist for bilateral LEs to edge of bed.  Pt required mod assist with use of chux pad to advance patient to edge of bed.  )     General bed mobility comments: assist with LE's into and out of bed. Able to lift trunk into sitting.  Required mod assist to scoot forward in prep for transfers.    Transfers   Equipment used: Rolling walker (2 wheeled) Transfers: Sit to/from Stand Sit to Stand: Min  assist         General transfer comment: Slow to ascend with flexed posture and posterior LOB.  Pt has difficulty sequencing and following commands to stand upright.    Ambulation/Gait Ambulation/Gait assistance: Mod assist Ambulation Distance (Feet): 15 Feet (x2 trials) Assistive device: Rolling walker (2 wheeled) Gait Pattern/deviations: Step-to pattern;Antalgic;Trunk flexed;Decreased stride length;Shuffle;Decreased weight shift to right;Decreased stance time - right Gait velocity: decreased Gait velocity interpretation: Below normal speed for age/gender General Gait Details: Pt required assist to manuever RW, advance RLE and maintain standing.  Pt is at a high risk for falling without significant assistance.     Stairs            Wheelchair Mobility    Modified Rankin (Stroke Patients Only)       Balance Overall balance assessment: Needs assistance   Sitting balance-Leahy Scale: Fair       Standing balance-Leahy Scale: Poor Standing balance comment: Flexed posture (LEs) and posterior lean.  Pt presents in squat position.                      Cognition Arousal/Alertness: Lethargic;Suspect due to medications Behavior During Therapy: Flat affect (Pt with blank stares during session ) Overall Cognitive Status: Impaired/Different from baseline Area of Impairment: Attention;Following commands;Safety/judgement;Awareness       Following Commands: Follows one step commands with increased time Safety/Judgement: Decreased awareness of safety;Decreased awareness of deficits (When therapist decreased assist patient  began to lose balance posterior but denies feeling off balance.  )     General Comments: likely due to meds, pt is very hard to keep aroused and focused. Daughter reports it is worse this morning.      Exercises Total Joint Exercises Ankle Circles/Pumps: AROM;Both;10 reps;Supine Quad Sets: AROM;Right;10 reps;Supine (poor contraction observed) Heel  Slides: AAROM;Right;10 reps;Supine Hip ABduction/ADduction: AAROM;Right;Supine;10 reps Goniometric ROM: 14-86 degrees R Knee.      General Comments        Pertinent Vitals/Pain Pain Assessment: 0-10 Pain Score: 6  Pain Location: R knee Pain Descriptors / Indicators: Aching;Grimacing;Guarding Pain Intervention(s): Monitored during session;Repositioned    Home Living                      Prior Function            PT Goals (current goals can now be found in the care plan section) Acute Rehab PT Goals Patient Stated Goal: to go home Potential to Achieve Goals: Good Progress towards PT goals: Progressing toward goals    Frequency    7X/week      PT Plan Discharge plan needs to be updated    Co-evaluation             End of Session Equipment Utilized During Treatment: Gait belt Activity Tolerance: Patient tolerated treatment well;Patient limited by fatigue Patient left: in bed;with call bell/phone within reach;with family/visitor present     Time: [id number] PT Time Calculation (min) (ACUTE ONLY): 42 min  Charges:  $Gait Training: 8-22 mins $Therapeutic Exercise: 8-22 mins $Therapeutic Activity: 8-22 mins                    G Codes:      Cristela Blue 11/28/16, 9:57 AM  Governor [place], PTA pager [phone]

## 2016-11-18 NOTE — Progress Notes (Addendum)
Occupational Therapy Treatment Patient Details Name: [name] MRN: [id number] DOB: 09-28-1936 Today's Date: 11/18/2016    History of present illness 81 y.o. female admitted to [facility] on 11/15/16 for elective R TKA.  Pt with significant PMHx of sciatica L, paresthesia (hands), HTN, glaucoma, DVT, lumbar disc disease, R foot surgery, back surgery, cervical fusion.     OT comments  Pt able to complete toilet transfer ambulating to the bathroom with mod assist +2; required cues for safety, sequencing, and physical assist to advance RLE during mobility. Pt required min assist for standing balance during peri care. Updated d/c plan to SNF for follow up to maximize independence and safety with ADL and functional mobility prior to return home. Will continue to follow acutely.   Follow Up Recommendations  SNF;Supervision/Assistance - 24 hour    Equipment Recommendations  3 in 1 bedside commode    Recommendations for Other Services      Precautions / Restrictions Precautions Precautions: Knee Precaution Booklet Issued: Yes (comment) Precaution Comments: reviewed no pillow under knee Required Braces or Orthoses: Knee Immobilizer - Right (did not use) Restrictions Weight Bearing Restrictions: No RLE Weight Bearing: Weight bearing as tolerated       Mobility Bed Mobility               General bed mobility comments: Pt OOB in chair upon arrival.  Transfers Overall transfer level: Needs assistance Equipment used: Rolling walker (2 wheeled) Transfers: Sit to/from Stand Sit to Stand: Mod assist;+2 physical assistance         General transfer comment: Mod assist to boost up from chair with increased time required. Cues for hand placement and technique.    Balance Overall balance assessment: Needs assistance Sitting-balance support: Feet supported;No upper extremity supported Sitting balance-Leahy Scale: Fair     Standing balance support: Single extremity supported;During  functional activity Standing balance-Leahy Scale: Poor Standing balance comment: single extremity supported with RW during peri care                   ADL Overall ADL's : Needs assistance/impaired                         Toilet Transfer: Moderate assistance;+2 for physical assistance;Ambulation;BSC;RW Toilet Transfer Details (indicate cue type and reason): Cues for sequencing and safety. Physical assist to advance RLE with mobility. Toileting- Clothing Manipulation and Hygiene: Minimal assistance;Sit to/from stand Toileting - Clothing Manipulation Details (indicate cue type and reason): Min assist for standing balance; pt able to complete peri care     Functional mobility during ADLs: Moderate assistance;+2 for physical assistance;Rolling walker General ADL Comments: Discussed SNF for follow up; pt and family agreeable.      Vision                     Perception     Praxis      Cognition   Behavior During Therapy: Flat affect (remains with blank stares during therapy session) Overall Cognitive Status: Impaired/Different from baseline Area of Impairment: Attention;Following commands;Safety/judgement;Awareness   Current Attention Level: Sustained    Following Commands: Follows one step commands with increased time Safety/Judgement: Decreased awareness of safety;Decreased awareness of deficits     General Comments: Pt more aroused but requires max VCs to remain focused on task.      Extremity/Trunk Assessment               Exercises    Shoulder  Instructions       General Comments      Pertinent Vitals/ Pain       Pain Assessment: Faces Faces Pain Scale: Hurts little more Pain Location: R knee Pain Descriptors / Indicators: Aching;Grimacing;Guarding Pain Intervention(s): Monitored during session;Repositioned  Home Living                                          Prior Functioning/Environment               Frequency  Min 2X/week        Progress Toward Goals  OT Goals(current goals can now be found in the care plan section)  Progress towards OT goals: Progressing toward goals  Acute Rehab OT Goals Patient Stated Goal: rehab then home OT Goal Formulation: With patient/family  Plan Discharge plan needs to be updated    Co-evaluation      Reason for Co-Treatment: For patient/therapist safety;To address functional/ADL transfers PT goals addressed during session: Mobility/safety with mobility;Balance OT goals addressed during session: ADL's and self-care;Proper use of [employer] equipment and DME      End of Session Equipment Utilized During Treatment: Gait belt;Rolling walker CPM Right Knee CPM Right Knee: Off    Activity Tolerance Patient tolerated treatment well   Patient Left in chair;with call bell/phone within reach;with family/visitor present   Nurse Communication          Time: [id number] OT Time Calculation (min): 25 min  Charges: OT General Charges $OT Visit: 1 Procedure OT Treatments $Self Care/Home Management : 8-22 mins  Binnie Kand M.S., OTR/L Pager: [phone]  11/18/2016, 1:49 PM

## 2016-11-18 NOTE — Progress Notes (Signed)
ANTICOAGULATION CONSULT NOTE - Follow Up Consult  Pharmacy Consult for Coumadin  Indication: VTE prophylaxis s/p right TKA  Allergies  Allergen Reactions  . Moxifloxacin Other (See Comments)    YEAST SUPERINFECTION  . Penicillins Itching and Other (See Comments)    YEAST SUPERINFECTION DUE TO PCN USE  Has patient had a PCN reaction causing immediate rash, facial/tongue/throat swelling, SOB or lightheadedness with hypotension: No Has patient had a PCN reaction causing severe rash involving mucus membranes or skin necrosis: No Has patient had a PCN reaction that required hospitalization No Has patient had a PCN reaction occurring within the last 10 years: No If all of the above answers are "NO", then may proceed with Cephalosporin use.   . Quinine Derivatives Other (See Comments)    DIZZY "CRAZY"  . Pramipexole Dihydrochloride     UNSPECIFIED REACTION   . Ropinirole Hydrochloride Other (See Comments)    UNSPECIFIED REACTION   . Codeine Nausea And Vomiting    Patient Measurements: Height: 5' 5.5" (166.4 cm) Weight: 181 lb 3.5 oz (82.2 kg) IBW/kg (Calculated) : 58.15  Vital Signs: Temp: 98.3 F (36.8 C) (01/01 0440) Temp Source: Oral (01/01 0440) BP: 119/54 (01/01 0440) Pulse Rate: 90 (01/01 0440)  Labs:  Recent Labs  11/15/16 1614  11/16/16 0519 11/17/16 0343 11/18/16 0550  HGB 13.3  --  12.4 12.3 11.4*  HCT 41.7  --  38.5 38.7 35.3*  PLT 187  --  202 197 172  LABPROT  --   < > 14.6 16.2* 16.9*  INR  --   < > 1.14 1.29 1.36  CREATININE 0.68  --  0.60  --   --   < > = values in this interval not displayed.  Estimated Creatinine Clearance: 62 mL/min (by C-G formula based on SCr of 0.6 mg/dL).   Medications:  Coumadin 5mg  given 12/29  Assessment: 27 YOF s/p TKA on Lovenox bridge to Coumadin for VTE prophylaxis.  INR today is SUBtherapeutic (INR 1.36 << 1.29, goal of 2-3). Hgb/Hct slight drop, plts wnl. No overt s/sx of bleeding noted.   Note fluconazole  was resumed from her prior to admission meds and this has a significant interaction with Coumadin. It appears this is an as needed medication so a pharmacist spoke with her regarding need - she states "she needs it."  Goal of Therapy:  INR 2-3 Monitor platelets by anticoagulation protocol: Yes   Plan:  1. Warfarin 6 mg x 1 dose at 1800 today 2. Will continue to monitor for any signs/symptoms of bleeding and will follow up with PT/INR in the a.m.  Thank you for allowing pharmacy to be a part of this patient's care.  Alycia Rossetti, PharmD, BCPS Clinical Pharmacist Pager: [phone] Clinical phone for 11/18/2016 from 7a-3:30p: [phone] If after 3:30p, please call main pharmacy at: [phone] 11/18/2016 10:33 AM

## 2016-11-18 NOTE — Discharge Instructions (Signed)
Ice to the knee as much as possible.  Do exercises every hour while awake to prevent stiffness,  Do not prop under the knee.  When elevating the leg , prop under the heel or calf to allow knee to get into extension.  Ok to put full weight on the leg, use walker for balance.  Keep the incision clean and dry and covered for one week, then ok to get wet in the shower.  Stop the Lovenox once the coumadin is therapeutic - INR target 2.5-3.0 for 30 days post op  Follow up with Dr Veverly Fells in two weeks in the office [phone]  Information on my medicine - Coumadin   (Warfarin)  This medication education was reviewed with me or my healthcare representative as part of my discharge preparation.    Why was Coumadin prescribed for you? Coumadin was prescribed for you because you have a blood clot or a medical condition that can cause an increased risk of forming blood clots. Blood clots can cause serious health problems by blocking the flow of blood to the heart, lung, or brain. Coumadin can prevent harmful blood clots from forming. As a reminder your indication for Coumadin is:   Blood Clot Prevention After Orthopedic Surgery  What test will check on my response to Coumadin? While on Coumadin (warfarin) you will need to have an INR test regularly to ensure that your dose is keeping you in the desired range. The INR (international normalized ratio) number is calculated from the result of the laboratory test called prothrombin time (PT).  If an INR APPOINTMENT HAS NOT ALREADY BEEN MADE FOR YOU please schedule an appointment to have this lab work done by your health care provider within 7 days. Your INR goal is usually a number between:  2 to 3 or your provider may give you a more narrow range like 2-2.5.  Ask your health care provider during an office visit what your goal INR is.  What  do you need to  know  About  COUMADIN? Take Coumadin (warfarin) exactly as prescribed by your healthcare provider  about the same time each day.  DO NOT stop taking without talking to the doctor who prescribed the medication.  Stopping without other blood clot prevention medication to take the place of Coumadin may increase your risk of developing a new clot or stroke.  Get refills before you run out.  What do you do if you miss a dose? If you miss a dose, take it as soon as you remember on the same day then continue your regularly scheduled regimen the next day.  Do not take two doses of Coumadin at the same time.  Important Safety Information A possible side effect of Coumadin (Warfarin) is an increased risk of bleeding. You should call your healthcare provider right away if you experience any of the following: ? Bleeding from an injury or your nose that does not stop. ? Unusual colored urine (red or dark brown) or unusual colored stools (red or black). ? Unusual bruising for unknown reasons. ? A serious fall or if you hit your head (even if there is no bleeding).  Some foods or medicines interact with Coumadin (warfarin) and might alter your response to warfarin. To help avoid this: ? Eat a balanced diet, maintaining a consistent amount of Vitamin K. ? Notify your provider about major diet changes you plan to make. ? Avoid alcohol or limit your intake to 1 drink for women and 2 drinks  for men per day. (1 drink is 5 oz. wine, 12 oz. beer, or 1.5 oz. liquor.)  Make sure that ANY health care provider who prescribes medication for you knows that you are taking Coumadin (warfarin).  Also make sure the healthcare provider who is monitoring your Coumadin knows when you have started a new medication including herbals and non-prescription products.  Coumadin (Warfarin)  Major Drug Interactions  Increased Warfarin Effect Decreased Warfarin Effect  Alcohol (large quantities) Antibiotics (esp. Septra/Bactrim, Flagyl, Cipro) Amiodarone (Cordarone) Aspirin (ASA) Cimetidine (Tagamet) Megestrol (Megace) NSAIDs  (ibuprofen, naproxen, etc.) Piroxicam (Feldene) Propafenone (Rythmol SR) Propranolol (Inderal) Isoniazid (INH) Posaconazole (Noxafil) Barbiturates (Phenobarbital) Carbamazepine (Tegretol) Chlordiazepoxide (Librium) Cholestyramine (Questran) Griseofulvin Oral Contraceptives Rifampin Sucralfate (Carafate) Vitamin K   Coumadin (Warfarin) Major Herbal Interactions  Increased Warfarin Effect Decreased Warfarin Effect  Garlic Ginseng Ginkgo biloba Coenzyme Q10 Green tea St. Johns wort    Coumadin (Warfarin) FOOD Interactions  Eat a consistent number of servings per week of foods HIGH in Vitamin K (1 serving =  cup)  Collards (cooked, or boiled & drained) Kale (cooked, or boiled & drained) Mustard greens (cooked, or boiled & drained) Parsley *serving size only =  cup Spinach (cooked, or boiled & drained) Swiss chard (cooked, or boiled & drained) Turnip greens (cooked, or boiled & drained)  Eat a consistent number of servings per week of foods MEDIUM-HIGH in Vitamin K (1 serving = 1 cup)  Asparagus (cooked, or boiled & drained) Broccoli (cooked, boiled & drained, or raw & chopped) Brussel sprouts (cooked, or boiled & drained) *serving size only =  cup Lettuce, raw (green leaf, endive, romaine) Spinach, raw Turnip greens, raw & chopped   These websites have more information on Coumadin (warfarin):  FailFactory.se; VeganReport.com.au;

## 2016-11-18 NOTE — Clinical Social Work Note (Signed)
Pt ready for DC today, needs SNF placement. Pt previously refused SNF.Faxed out to all requested facilities and all facilities in [place].   CSW FU with [facility], spoke to [place], who reported willing to look at pt but pharmacy closed today and unable to get any meds.   CSW will continue to make efforts to find placement.  [name] Clinical Social Work Dept Weekend Social Worker [phone] 12:50 PM

## 2016-11-18 NOTE — Clinical Social Work Placement (Addendum)
   CLINICAL SOCIAL WORK PLACEMENT  NOTE  Date:  11/18/2016  Patient Details  Name: Sydney FALSO MRN: [id number] Date of Birth: September 24, 1936  Clinical Social Work is seeking post-discharge placement for this patient at the [place] level of care (*CSW will initial, date and re-position this form in  chart as items are completed):  Yes   Patient/family provided with [facility] Work Department's list of facilities offering this level of care within the geographic area requested by the patient (or if unable, by the patient's family).  Yes   Patient/family informed of their freedom to choose among providers that offer the needed level of care, that participate in Medicare, Medicaid or managed care program needed by the patient, have an available bed and are willing to accept the patient.  Yes   Patient/family informed of [facility]'s ownership interest in [facility] and [facility], as well as of the fact that they are under no obligation to receive care at these facilities.  PASRR submitted to EDS on 11/18/16     PASRR number received on 11/18/16     Existing PASRR number confirmed on       FL2 transmitted to all facilities in geographic area requested by pt/family on 11/18/16     FL2 transmitted to all facilities within larger geographic area on       Patient informed that his/her managed care company has contracts with or will negotiate with certain facilities, including the following:            Patient/family informed of bed offers received.  Patient chooses bed at [facility] in [place]     Physician recommends and patient chooses bed at      Patient to be transferred to [facility] in [place] on 11/19/16.  Patient to be transferred to facility by [facility]     Patient family notified on 11/19/16 of transfer.  Name of family member notified:  Debbie     PHYSICIAN       Additional Comment:     _______________________________________________ Serafina Mitchell, [place] 11/18/2016, 12:23 PM

## 2016-11-18 NOTE — Clinical Social Work Note (Addendum)
Clinical Social Work Assessment  Patient Details  Name: [name] MRN: [id number] Date of Birth: 10/08/1936  Date of referral:  11/18/16               Reason for consult:  Facility Placement                Permission sought to share information with:  Case Manager, Facility Contact Representative, Family Supports Permission granted to share information::  Yes, Verbal Permission Granted  Name::        Agency::     Relationship::  Dtr/Son [phone]  Contact Information:     Housing/Transportation Living arrangements for the past 2 months:  Single Family Home Source of Information:  Patient, Adult Children Patient Interpreter Needed:  None Criminal Activity/Legal Involvement Pertinent to Current Situation/Hospitalization:  No - Comment as needed Significant Relationships:  Adult Children Lives with:  Self Do you feel safe going back to the place where you live?  No Need for family participation in patient care:  Yes (Comment)  Care giving concerns:  Pt lives alone but agreeable to go to SNF for 2 weeks   Social Worker assessment / plan:  CSW met with pt and family at bedside. Pt hesitant about going to SNF due to her mother expiring @ [facility] in [place]. Pt understands that it is best for her safety. Pt was independent with all ADL's prior to hospitalization. Pt lives alone in a single family home. Pt has great support from dtr/son and brother who live near. Pt also has several friends and it is important that she be close to [place] for visits. CSW will coordinate pt's DC to SNF.  Employment status:  Retired Insurance information:  Medicare PT Recommendations:  Skilled Nursing Facility Information / Referral to community resources:     Patient/Family's Response to care:  Pt very appreciative of hospital staff and PT's recommendation to SNF for her safety.   Patient/Family's Understanding of and Emotional Response to Diagnosis, Current Treatment, and  Prognosis:  Pt understands after knee replacement that she needs to complete a rehab program to gain her independence back. Pt understands short term SNF is for her safety.  Emotional Assessment Appearance:  Appears stated age Attitude/Demeanor/Rapport:   (Pleasant) Affect (typically observed):  Accepting Orientation:  Oriented to Self, Oriented to Place, Oriented to  Time, Oriented to Situation Alcohol / Substance use:  Never Used Psych involvement (Current and /or in the community):  No (Comment)  Discharge Needs  Concerns to be addressed:  Home Safety Concerns Readmission within the last 30 days:  No Current discharge risk:  Lives alone Barriers to Discharge:      ,  B, LCSWA 11/18/2016, 12:33 PM  

## 2016-11-18 NOTE — Plan of Care (Signed)
Problem: Tissue Perfusion: Goal: Risk factors for ineffective tissue perfusion will decrease Outcome: Progressing No s/s of DVT  Problem: Activity: Goal: Risk for activity intolerance will decrease Outcome: Not Progressing Does not tolerate activity, OOB to [facility] with 2 person's assistance with max assistance

## 2016-11-18 NOTE — NC FL2 (Signed)
[place] LEVEL OF CARE SCREENING TOOL     IDENTIFICATION  Patient Name: [name] Birthdate: Jan 17, 1936 Sex: female Admission Date (Current Location): 11/15/2016  [facility] and [place] Number:  Herbalist and Address:  [facility] [place] [address] [place], [place] [address]      Provider Number: [id number]  Attending Physician Name and Address:  Netta Cedars, MD  Relative Name and Phone Number:  Dtr-Debbie [phone]    Current Level of Care: Hospital Recommended Level of Care: [place] Prior Approval Number:    Date Approved/Denied:   PASRR Number: [id number]  Discharge Plan: SNF    Current Diagnoses: Patient Active Problem List   Diagnosis Date Noted  . H/O total knee replacement, right 11/15/2016  . Varicose veins of right lower extremity with complications 99991111  . Degenerative arthritis of knee, bilateral 01/24/2016  . Bilateral knee pain 12/20/2015  . Bilateral groin pain 12/20/2015  . Diastolic dysfunction [id number]  . Left wrist pain 11/08/2014  . Grief reaction 11/10/2013  . Left hip pain 11/04/2013  . Back pain 11/07/2012  . Impaired glucose tolerance 04/02/2012  . Encounter for long-term (current) use of high-risk medication 04/03/2011  . Preventative health care 03/31/2011  . [place] DISEASE, LUMBAR 06/21/2009  . FATIGUE 06/21/2009  . SCIATICA, LEFT 12/28/2008  . Hyperlipidemia 06/22/2008  . DIVERTICULOSIS, COLON 06/22/2008  . [place] 10/30/2007  . Essential hypertension 10/30/2007  . PARESTHESIA, HANDS 10/30/2007  . DEPRESSION 06/13/2007  . GERD 06/13/2007  . OSTEOPENIA 06/13/2007  . DVT, HX OF 06/13/2007    Orientation RESPIRATION BLADDER Height & Weight     Self, Time, Situation, Place  Normal Continent Weight: 181 lb 3.5 oz (82.2 kg) Height:  5' 5.5" (166.4 cm)  BEHAVIORAL SYMPTOMS/MOOD NEUROLOGICAL BOWEL NUTRITION STATUS      Continent    AMBULATORY STATUS  COMMUNICATION OF NEEDS Skin   Limited Assist Verbally Normal                       Personal Care Assistance Level of Assistance  Bathing, Dressing Bathing Assistance: Limited assistance   Dressing Assistance: Limited assistance     Functional Limitations Info             SPECIAL CARE FACTORS FREQUENCY  PT (By licensed PT), OT (By licensed OT)     PT Frequency: 5x wk OT Frequency: 5x wk            Contractures Contractures Info: Not present    Additional Factors Info  Code Status, Allergies Code Status Info: Full Allergies Info: Moxifloxacin, Penicillins, Quinine Derivatives, Pramipexole Dihydrochloride, Ropinirole Hydrochloride, Codeine           Current Medications (11/18/2016):  This is the current hospital active medication list Current Facility-Administered Medications  Medication Dose Route Frequency Provider Last Rate Last Dose  . 0.9 %  sodium chloride infusion   Intravenous Continuous Netta Cedars, MD 50 mL/hr at 11/15/16 1609 1 mL at 11/15/16 1609  . acetaminophen (TYLENOL) tablet 650 mg  650 mg Oral Q6H PRN Netta Cedars, MD       Or  . acetaminophen (TYLENOL) suppository 650 mg  650 mg Rectal Q6H PRN Netta Cedars, MD      . ALPRAZolam Duanne Moron) tablet 0.25 mg  0.25 mg Oral BID PRN Netta Cedars, MD      . amLODipine (NORVASC) tablet 2.5 mg  2.5 mg Oral Daily Netta Cedars,  MD   2.5 mg at 11/18/16 0841  . aspirin EC tablet 650 mg  650 mg Oral Daily PRN Netta Cedars, MD      . bisacodyl (DULCOLAX) suppository 10 mg  10 mg Rectal Daily PRN Netta Cedars, MD      . clobetasol cream (TEMOVATE) 0.05 %   Topical BID Netta Cedars, MD      . clotrimazole (GYNE-LOTRIMIN 3) 2 % vaginal cream 1 Applicatorful  1 Applicatorful Vaginal QHS Netta Cedars, MD   1 Applicatorful at Q000111Q 2020  . docusate sodium (COLACE) capsule 100 mg  100 mg Oral BID Netta Cedars, MD   100 mg at 11/18/16 1000  . enoxaparin (LOVENOX) injection 30 mg  30 mg Subcutaneous Q12H Netta Cedars,  MD   30 mg at 11/18/16 0841  . estradiol (ESTRACE) vaginal cream 1 Applicatorful  1 Applicatorful Vaginal Once per day on Mon Thu Netta Cedars, MD   1 Applicatorful at Q000111Q 2016  . ferrous sulfate tablet 325 mg  325 mg Oral TID PC Netta Cedars, MD   325 mg at 11/18/16 [id number]  . fluconazole (DIFLUCAN) tablet 100 mg  100 mg Oral Daily Netta Cedars, MD   100 mg at 11/18/16 0842  . gabapentin (NEURONTIN) capsule 600 mg  600 mg Oral BID Netta Cedars, MD   600 mg at 11/18/16 0842  . gentamicin ointment (GARAMYCIN) 0.1 % 1 application  1 application Topical TID Netta Cedars, MD   1 application at Q000111Q 2017  . hydrocortisone (ANUSOL-HC) 2.5 % rectal cream   Rectal BID Netta Cedars, MD      . hydrocortisone (ANUSOL-HC) suppository 25 mg  25 mg Rectal BID PRN Netta Cedars, MD      . HYDROmorphone (DILAUDID) injection 0.5-1 mg  0.5-1 mg Intravenous Q2H PRN Netta Cedars, MD   1 mg at 11/16/16 0820  . lactated ringers infusion   Intravenous Continuous Catalina Gravel, MD 10 mL/hr at 11/15/16 1110    . latanoprost (XALATAN) 0.005 % ophthalmic solution 1 drop  1 drop Both Eyes QHS Netta Cedars, MD   1 drop at 11/17/16 2226  . loratadine (CLARITIN) tablet 10 mg  10 mg Oral Daily Netta Cedars, MD   10 mg at 11/18/16 0842  . menthol-cetylpyridinium (CEPACOL) lozenge 3 mg  1 lozenge Oral PRN Netta Cedars, MD       Or  . phenol [facility]) mouth spray 1 spray  1 spray Mouth/Throat PRN Netta Cedars, MD      . methocarbamol (ROBAXIN) tablet 500 mg  500 mg Oral Q6H PRN Netta Cedars, MD   500 mg at 11/16/16 [id number]   Or  . methocarbamol (ROBAXIN) 500 mg in dextrose 5 % 50 mL IVPB  500 mg Intravenous Q6H PRN Netta Cedars, MD      . metoCLOPramide (REGLAN) tablet 5-10 mg  5-10 mg Oral Q8H PRN Netta Cedars, MD       Or  . metoCLOPramide (REGLAN) injection 5-10 mg  5-10 mg Intravenous Q8H PRN Netta Cedars, MD      . nitrofurantoin (macrocrystal-monohydrate) (MACROBID) capsule 100 mg  100 mg Oral QHS Netta Cedars,  MD   100 mg at 11/17/16 2225  . nystatin cream (MYCOSTATIN) 1 application  1 application Topical PRN Netta Cedars, MD      . ondansetron [facility]) tablet 4 mg  4 mg Oral Q6H PRN Netta Cedars, MD       Or  . ondansetron [facility]) injection 4 mg  4 mg  Intravenous Q6H PRN Netta Cedars, MD   4 mg at 11/16/16 0820  . oxyCODONE (Oxy IR/ROXICODONE) immediate release tablet 5-10 mg  5-10 mg Oral Q3H PRN Netta Cedars, MD   10 mg at 11/16/16 1149  . polyethylene glycol (MIRALAX / GLYCOLAX) packet 17 g  17 g Oral Daily PRN Netta Cedars, MD   17 g at 11/17/16 1051  . pravastatin (PRAVACHOL) tablet 40 mg  40 mg Oral q1800 Netta Cedars, MD   40 mg at 11/17/16 1813  . saccharomyces boulardii (FLORASTOR) capsule 250 mg  250 mg Oral Daily Netta Cedars, MD   250 mg at 11/18/16 [phone]  . sertraline (ZOLOFT) tablet 200 mg  200 mg Oral Daily Netta Cedars, MD   200 mg at 11/18/16 0841  . timolol (TIMOPTIC) 0.5 % ophthalmic solution 1 drop  1 drop Both Eyes Daily Netta Cedars, MD   1 drop at 11/18/16 0843  . traMADol (ULTRAM) tablet 50-100 mg  50-100 mg Oral Q6H PRN Netta Cedars, MD   50 mg at 11/18/16 0602  . warfarin (COUMADIN) tablet 6 mg  6 mg Oral ONCE-1800 Rolla Flatten, [facility]      . Warfarin - Pharmacist Dosing Inpatient   Does not apply [place], [facility]         Discharge Medications: Please see discharge summary for a list of discharge medications.  Relevant Imaging Results:  Relevant Lab Results:   Additional Information    Jasiah Elsen B, LCSWA

## 2016-11-18 NOTE — Progress Notes (Signed)
    Subjective: 3 Days Post-Op Procedure(s) (LRB): RIGHT TOTAL KNEE ARTHROPLASTY (Right) Patient reports pain as 4 on 0-10 scale.   Denies CP or SOB.  Voiding without difficulty. Positive flatus. Objective: Vital signs in last 24 hours: Temp:  [97.7 F (36.5 C)-98.3 F (36.8 C)] 98.3 F (36.8 C) (01/01 0440) Pulse Rate:  [77-90] 90 (01/01 0440) Resp:  [16] 16 (01/01 0440) BP: (119-154)/(51-60) 119/54 (01/01 0440) SpO2:  [94 %-97 %] 96 % (01/01 0440)  Intake/Output from previous day: 12/31 0701 - 01/01 0700 In: 480 [P.O.:480] Out: -  Intake/Output this shift: No intake/output data recorded.  Labs:  Recent Labs  11/15/16 1614 11/16/16 0519 11/17/16 0343 11/18/16 0550  HGB 13.3 12.4 12.3 11.4*    Recent Labs  11/17/16 0343 11/18/16 0550  WBC 11.3* 9.1  RBC 3.96 3.61*  HCT 38.7 35.3*  PLT 197 172    Recent Labs  11/15/16 1614 11/16/16 0519  NA  --  138  K  --  4.2  CL  --  103  CO2  --  27  BUN  --  12  CREATININE 0.68 0.60  GLUCOSE  --  113*  CALCIUM  --  8.8*    Recent Labs  11/17/16 0343 11/18/16 0550  INR 1.29 1.36    Physical Exam: Neurologically intact ABD soft Intact pulses distally Incision: dressing C/D/I Compartment soft  Assessment/Plan: 3 Days Post-Op Procedure(s) (LRB): RIGHT TOTAL KNEE ARTHROPLASTY (Right) Advance diet Up with therapy Plan for discharge tomorrow - possible d/c today depending upon recommendations of PT  Zayquan Bogard D for Dr. Melina Schools [facility] [phone] 11/18/2016, 8:59 AM

## 2016-11-18 NOTE — Progress Notes (Signed)
Orthopedic Tech Progress Note Patient Details:  [name] 1935/12/31 [id number]  Patient ID: [name], female   DOB: 12-11-1935, 81 y.o.   MRN: [id number] Applied cpm 0-60  Karolee Stamps 11/18/2016, 6:16 AM

## 2016-11-18 NOTE — Progress Notes (Addendum)
Physical Therapy Treatment Patient Details Name: [name] MRN: [id number] DOB: 1936/04/06 Today's Date: 11/18/2016    History of Present Illness 81 y.o. female admitted to [facility] on 11/15/16 for elective R TKA.  Pt with significant PMHx of sciatica L, paresthesia (hands), HTN, glaucoma, DVT, lumbar disc disease, R foot surgery, back surgery, cervical fusion.      PT Comments    Pt performed pm treatment with OT present to reassess mobility based on decline in function.  Pt continues to benefit from short term stay at skilled nursing facility to improve strength and improve functional mobility before returning home.  Pt, son and daughter in agreement with short term SNF stay.  Will continue PT per POC during acute hospitalization to maximize function for better success at rehab.    Follow Up Recommendations  SNF     Equipment Recommendations   (TBD at next venue)    Recommendations for Other Services       Precautions / Restrictions Precautions Precautions: Knee Precaution Booklet Issued: Yes (comment) Precaution Comments: reviewed no pillow under knee Required Braces or Orthoses: Knee Immobilizer - Right (did not use) Restrictions Weight Bearing Restrictions: No RLE Weight Bearing: Weight bearing as tolerated    Mobility  Bed Mobility Overal bed mobility: Needs Assistance Bed Mobility: Sit to Supine       Sit to supine: Min assist   General bed mobility comments: Pt required min assist to lift RLE into bed.  Pt then able to boost in supine un assisted with bed placed in trendelenberg position for gravity assistance.    Transfers Overall transfer level: Needs assistance Equipment used: Rolling walker (2 wheeled) Transfers: Sit to/from [facility] Sit to Stand: Min assist;Mod assist (assist level varried min from [facility] and mod assist from toilet.  ) +2 for safety due to poor performance this am.   Stand pivot transfers: Min assist       General transfer  comment: Slow to ascend with flexed posture and posterior LOB.  Pt has difficulty sequencing and following commands to stand upright.    Ambulation/Gait Ambulation/Gait assistance: Min assist;Mod assist;+2 physical assistance;+2 safety/equipment (Pt required decreased assist as gait progressed but remains high for risk of falling.  ) Ambulation Distance (Feet): 15 Feet (+ 60 ft.  ) Assistive device: Rolling walker (2 wheeled) Gait Pattern/deviations: Step-to pattern;Antalgic;Trunk flexed;Decreased stride length;Shuffle;Decreased weight shift to right;Decreased stance time - right Gait velocity: decreased Gait velocity interpretation: Below normal speed for age/gender General Gait Details: Pt remains to require mod assist to keep RW close to body position.  Cues for sequencing, foot clearance, stride length and forward gaze.  Pt fatigues quickly during gait training but able to advance gait distance.  LOB observed anteriorly.     Stairs            Wheelchair Mobility    Modified Rankin (Stroke Patients Only)       Balance Overall balance assessment: Needs assistance Sitting-balance support: Feet supported;No upper extremity supported Sitting balance-Leahy Scale: Fair     Standing balance support: Single extremity supported;During functional activity Standing balance-Leahy Scale: Poor Standing balance comment: single extremity supported with RW during peri care                    Cognition Arousal/Alertness: Awake/alert Behavior During Therapy: Flat affect (remains with blank stares during therapy session) Overall Cognitive Status: Impaired/Different from baseline Area of Impairment: Attention;Following commands;Safety/judgement;Awareness   Current Attention Level: Sustained  Following Commands: Follows one step commands with increased time Safety/Judgement: Decreased awareness of safety;Decreased awareness of deficits     General Comments: Pt more aroused but  requires max VCs to remain focused on task.      Exercises Total Joint Exercises Ankle Circles/Pumps: AROM;Both;10 reps;Supine Quad Sets: AROM;Right;10 reps;Supine Heel Slides: AAROM;Right;10 reps;Supine Hip ABduction/ADduction: AAROM;Right;Supine;10 reps Straight Leg Raises: AAROM;10 reps;Supine;Right    General Comments        Pertinent Vitals/Pain Pain Assessment: Faces Faces Pain Scale: Hurts little more Pain Location: R knee Pain Descriptors / Indicators: Aching;Grimacing;Guarding Pain Intervention(s): Monitored during session;Repositioned    Home Living                      Prior Function            PT Goals (current goals can now be found in the care plan section) Acute Rehab PT Goals Patient Stated Goal: rehab then home Potential to Achieve Goals: Good Progress towards PT goals: Progressing toward goals    Frequency    7X/week      PT Plan Current plan remains appropriate    Co-evaluation PT/OT/SLP Co-Evaluation/Treatment: Yes Reason for Co-Treatment: For patient/therapist safety;To address functional/ADL transfers PT goals addressed during session: Mobility/safety with mobility;Balance OT goals addressed during session: ADL's and self-care;Proper use of [employer] equipment and DME     End of Session Equipment Utilized During Treatment: Gait belt Activity Tolerance: Patient tolerated treatment well;Patient limited by fatigue Patient left: in bed;with call bell/phone within reach;with family/visitor present     Time: [id number] PT Time Calculation (min) (ACUTE ONLY): 43 min  Charges:  $Gait Training: 8-22 mins $Therapeutic Exercise: 8-22 mins                     G Codes:      Cristela Blue 2016/12/04, 1:54 PM  Governor Rooks, PTA pager [phone]

## 2016-11-19 ENCOUNTER — Telehealth: Payer: Self-pay | Admitting: *Deleted

## 2016-11-19 LAB — PROTIME-INR
INR: 1.6
PROTHROMBIN TIME: 19.3 s — AB (ref 11.4–15.2)

## 2016-11-19 MED ORDER — WARFARIN SODIUM 5 MG PO TABS: 5.0000 mg | ORAL_TABLET | Freq: Once | ORAL | Status: DC

## 2016-11-19 NOTE — Care Management Important Message (Signed)
Important Message  Patient Details  Name: [name] MRN: [id number] Date of Birth: 23-Aug-1936   Medicare Important Message Given:  Yes    Orbie Pyo 11/19/2016, 4:46 PM

## 2016-11-19 NOTE — Progress Notes (Signed)
   Subjective: 4 Days Post-Op Procedure(s) (LRB): RIGHT TOTAL KNEE ARTHROPLASTY (Right)  Still c/o mild to moderate pain in the knee Plan to d/c to SNF today Denies any new symptoms or issues Patient reports pain as moderate.  Objective:   VITALS:   Vitals:   11/18/16 1406 11/18/16 2007  BP: (!) 130/57 (!) 114/43  Pulse: 73 70  Resp: 16 16  Temp: 98.5 F (36.9 C) 98.5 F (36.9 C)    Right knee incision healing well nv intact distally No rashes  Minimal edema distally  LABS  Recent Labs  11/17/16 0343 11/18/16 0550  HGB 12.3 11.4*  HCT 38.7 35.3*  WBC 11.3* 9.1  PLT 197 172    No results for input(s): NA, K, BUN, CREATININE, GLUCOSE in the last 72 hours.   Assessment/Plan: 4 Days Post-Op Procedure(s) (LRB): RIGHT TOTAL KNEE ARTHROPLASTY (Right) Continue PT/OT D/c to SNF today F/u in 2 weeks    Merla Riches, MPAS, PA-C  11/19/2016, 8:51 AM

## 2016-11-19 NOTE — Telephone Encounter (Signed)
Refill done.  

## 2016-11-19 NOTE — Progress Notes (Signed)
ANTICOAGULATION CONSULT NOTE - Follow Up Consult  Pharmacy Consult for Coumadin  Indication: VTE prophylaxis s/p right TKA  Allergies  Allergen Reactions  . Moxifloxacin Other (See Comments)    YEAST SUPERINFECTION  . Penicillins Itching and Other (See Comments)    YEAST SUPERINFECTION DUE TO PCN USE  Has patient had a PCN reaction causing immediate rash, facial/tongue/throat swelling, SOB or lightheadedness with hypotension: No Has patient had a PCN reaction causing severe rash involving mucus membranes or skin necrosis: No Has patient had a PCN reaction that required hospitalization No Has patient had a PCN reaction occurring within the last 10 years: No If all of the above answers are "NO", then may proceed with Cephalosporin use.   . Quinine Derivatives Other (See Comments)    DIZZY "CRAZY"  . Pramipexole Dihydrochloride     UNSPECIFIED REACTION   . Ropinirole Hydrochloride Other (See Comments)    UNSPECIFIED REACTION   . Codeine Nausea And Vomiting    Patient Measurements: Height: 5' 5.5" (166.4 cm) Weight: 181 lb 3.5 oz (82.2 kg) IBW/kg (Calculated) : 58.15  Vital Signs:    Labs:  Recent Labs  11/17/16 0343 11/18/16 0550 11/19/16 0536  HGB 12.3 11.4*  --   HCT 38.7 35.3*  --   PLT 197 172  --   LABPROT 16.2* 16.9* 19.3*  INR 1.29 1.36 1.60    Estimated Creatinine Clearance: 62 mL/min (by C-G formula based on SCr of 0.6 mg/dL).   Assessment: 77 YOF s/p TKA on Lovenox bridge to Coumadin for VTE prophylaxis.  INR today is SUBtherapeutic at 1.6. No new CBC, No overt s/sx of bleeding noted.   Note fluconazole was resumed from her prior to admission meds and this has a significant interaction with Coumadin. It appears this is an as needed medication so a pharmacist spoke with her regarding need - she states "she needs it."  Goal of Therapy:  INR 2-3 Monitor platelets by anticoagulation protocol: Yes   Plan:  1. Warfarin 5 mg PO tonight 2. Will  continue to monitor for any signs/symptoms of bleeding 3. Daily PT/INR 4. Please discontinue fluconazole if not discharged today - she has had 4 doses  Thank you for allowing pharmacy to be a part of this patient's care.  Renold Genta, PharmD, BCPS Clinical Pharmacist Phone for today - [place] - [phone] 11/19/2016 9:49 AM

## 2016-11-19 NOTE — Progress Notes (Signed)
Physical Therapy Treatment Patient Details Name: [name] MRN: [id number] DOB: July 29, 1936 Today's Date: 11/19/2016    History of Present Illness 81 y.o. female admitted to [facility] on 11/15/16 for elective R TKA.  Pt with significant PMHx of sciatica L, paresthesia (hands), HTN, glaucoma, DVT, lumbar disc disease, R foot surgery, back surgery, cervical fusion.      PT Comments    Noting excellent improvements in activity tolerance and functional mobility; Able to increase distance walked as well as posture and gait pattern; Overall progressing well; Anticipate continuing good progress at post-acute rehabilitation.   Follow Up Recommendations  SNF     Equipment Recommendations  Rolling walker with 5" wheels;3in1 (PT)    Recommendations for Other Services       Precautions / Restrictions Precautions Precautions: Knee Precaution Booklet Issued: Yes (comment) Precaution Comments: reviewed no pillow under knee Required Braces or Orthoses: Knee Immobilizer - Right Restrictions RLE Weight Bearing: Weight bearing as tolerated    Mobility  Bed Mobility Overal bed mobility: Needs Assistance Bed Mobility: Supine to Sit     Supine to sit: Min assist     General bed mobility comments: min assist to support RLE coming off of bed; cues for technique; good sue of rails to sit up  Transfers Overall transfer level: Needs assistance Equipment used: Rolling walker (2 wheeled) Transfers: Sit to/from Stand Sit to Stand: Min assist         General transfer comment: Cues for hand placement, technqiue and safety; min steadying assist, especially while moving hands to RW; much improved  Ambulation/Gait Ambulation/Gait assistance: Min assist;+2 safety/equipment Ambulation Distance (Feet): 100 Feet Assistive device: Rolling walker (2 wheeled) Gait Pattern/deviations: Step-through pattern;Decreased stance time - right;Decreased step length - left;Trunk flexed (step-through pattern  emerging) Gait velocity: decreased   General Gait Details: verbal and tacile cueing for posture, to activate R quad for stance stability, and to increase L step length   Stairs            Wheelchair Mobility    Modified Rankin (Stroke Patients Only)       Balance     Sitting balance-Leahy Scale: Fair       Standing balance-Leahy Scale: Poor                      Cognition Arousal/Alertness: Awake/alert Behavior During Therapy: WFL for tasks assessed/performed Overall Cognitive Status: Within Functional Limits for tasks assessed (for simple mobility tasks)                 General Comments: Was able to remember to not let anyone place a pillow under knee; unable to remember why    Exercises Total Joint Exercises Quad Sets: AROM;Right;5 reps Heel Slides: AAROM;Right;5 reps Straight Leg Raises: AAROM;10 reps;Supine;Right Goniometric ROM: approx 8-70 deg    General Comments        Pertinent Vitals/Pain Pain Assessment: Faces Faces Pain Scale: Hurts even more ("Way up there") Pain Location: R knee Pain Descriptors / Indicators: Aching;Grimacing;Guarding Pain Intervention(s): Monitored during session    Home Living                      Prior Function            PT Goals (current goals can now be found in the care plan section) Acute Rehab PT Goals Patient Stated Goal: rehab then home PT Goal Formulation: With patient Time For Goal Achievement: 11/22/16 Potential to Achieve  Goals: Good Progress towards PT goals: Progressing toward goals    Frequency    7X/week      PT Plan Current plan remains appropriate    Co-evaluation             End of Session Equipment Utilized During Treatment: Gait belt;Right knee immobilizer Activity Tolerance: Patient tolerated treatment well Patient left: in chair;with call bell/phone within reach;with family/visitor present     Time: 0950-1016 PT Time Calculation (min) (ACUTE  ONLY): 26 min  Charges:  $Gait Training: 8-22 mins $Therapeutic Activity: 8-22 mins                    G Codes:      [name] 11/30/16, 12:20 PM   [name], [place] Pager [phone] Office [phone]

## 2016-11-19 NOTE — Op Note (Signed)
[name], [name]                 ACCOUNT NO.:  [id number]  MEDICAL RECORD NO.:  [id number]  LOCATION:                                 FACILITY:  PHYSICIAN:  Doran Heater. Veverly Fells, M.D. DATE OF BIRTH:  July 23, 1936  DATE OF PROCEDURE:  11/15/2016 DATE OF DISCHARGE:                              OPERATIVE REPORT   PREOPERATIVE DIAGNOSIS:  Right knee end-stage osteoarthritis.  POSTOPERATIVE DIAGNOSIS:  Right knee end-stage osteoarthritis.  PROCEDURE PERFORMED:  Right total knee replacement using DePuy Sigma Rotating Platform Prosthesis.  SURGEON:  Doran Heater. Veverly Fells, M.D.  ASSISTANT:  Abbott Pao. Dixon, PA-C, who scrubbed the entire procedure and necessary for satisfactory completion of surgery.  ANESTHESIA:  Spinal anesthesia was used plus a femoral nerve block.  ESTIMATED BLOOD LOSS:  100 mL.  FLUID REPLACEMENT:  1500 mL crystalloid.  INSTRUMENT COUNTS:  Correct.  COMPLICATIONS:  There were no complications.  ANTIBIOTICS:  Perioperative antibiotics were given.  INDICATIONS:  The patient is a 81 year old female with a history of worsening right knee pain and function secondary to end-stage arthritis. The patient has had progressive pain despite conservative management, presents for total knee arthroplasty to restore function and eliminate pain.  Informed consent obtained.  DESCRIPTION OF PROCEDURE:  After an adequate level of anesthesia achieved, the patient was positioned supine on the operating room table. Right leg correctly identified.  Nonsterile tourniquet placed on the proximal thigh.  Right leg sterilely prepped and draped in usual manner. Left leg padded appropriately and secured to the OR table.  After sterile prep and drape, we performed our time-out verifying correct patient and correct site.  We then elevated the limb, exsanguinated using Esmarch bandage, and elevated the tourniquet to 350 mmHg with the knee flexed.  We created a midline incision with a #10 blade  scalpel. Dissection down through subcutaneous tissues using #10 blade. Identified the medial parapatellar tissues and with a fresh #10-blade scalpel, performed a parapatellar arthrotomy, everted the patella, and divided the lateral patellofemoral ligaments.  We entered the distal femur with a step-cut drill.  We then placed intramedullary guide for distal femoral resection, 10 mm were resected 5 degrees right.  Once the distal femur was resected, we measured the femur size 4 anterior down. It was in between 3 and 4.  We selected a 4 for increased flexion gap stability.  We knew we would go with a 4 narrow.  At this point, we removed ACL, PCL, and meniscal tissues.  Subluxed the tibia anteriorly. We performed our tibial cut 90 degrees perpendicular at long axis of the tibia with minimal posterior slope to the posterior cruciate substituting prosthesis using the oscillating saw.  We resected 2 mm off the affected side.  We then went ahead and resected the posterior bone off the posterior femoral condyles.  Placed our laminar spreader and went and checked our gaps, which were symmetric at 10 mm.  We then completed our tibial preparation with the modular drill and keel punch. Next, we went ahead and dressed up the femur with the box cut guide and we then placed a 4 narrow femur and a 3 tibia.  With those components in  place, we placed a size 4, 10 mm insert and reduced the knee with a nice pop.  We were able to get full extension, good flexion and extension stability.  We then resurfaced the patella going from 25 mm thickness down to about a 17 mm thickness and cut using patellar clamp.  We then sized for 35 patellar button, drilled our PEG holes, and placed the trial patella in place.  We ranged the knee through a full range of motion.  Perfect patellar tracking.  No touch technique.  Removed all trial components, irrigated the knee thoroughly, dried the bone, and cemented all components in  place with a few high viscosity cement.  Once we placed a 10 trial and placed the knee in extension until cement hardened.  Then, we removed excess cement, using coarse curved osteotome.  Once, we had removed all excess cement and inspected the entire knee, we trialed again with a size 10.  We were pleased with that.  We could get to full extension, nice and stable both in flexion and extension.  We felt if we went to a 12.5, we probably would not achieve full extension.  It was not necessary at this point.  We removed the trial component and placed a size 4, 10-mm poly insert in place. This was a posterior cruciate substituting prosthesis rotating platform and placed the poly in place, and reduced the knee again with a nice pop medially.  A good stable knee.  Perfect patellar tracking.  Irrigated the knee thoroughly and then placed TXA topically as the patient was not a candidate for IV.  We then repaired the median parapatellar arthrotomy with interrupted #1 Vicryl suture, followed by 0 and 2-0 Vicryl subcutaneous closure, and 4-0 Monocryl for skin.  Steri-Strips applied followed by sterile dressing.  The patient tolerated the surgery well.     Doran Heater. Veverly Fells, M.D.   ______________________________ Doran Heater. Veverly Fells, M.D.    SRN/MEDQ  D:  11/15/2016  T:  11/16/2016  Job:  [id number]

## 2016-11-19 NOTE — Telephone Encounter (Signed)
Pt was on the patient ping list admitted for right knee surgery 11/15/16. Pt d/c 11/19/16, and will f/u w/[facility] in 2 weeks...Johny Chess

## 2016-11-19 NOTE — Discharge Summary (Signed)
Physician Discharge Summary   Patient ID: [name] MRN: [id number] DOB/AGE: November 05, 1936 81 y.o.  Admit date: 11/15/2016 Discharge date: 11/19/2016  Admission Diagnoses:  Active Problems:   H/O total knee replacement, right   Discharge Diagnoses:  Same   Surgeries: Procedure(s): RIGHT TOTAL KNEE ARTHROPLASTY on 11/15/2016   Consultants: PT/OT  Discharged Condition: Stable  Hospital Course: [name] is an 81 y.o. female who was admitted 11/15/2016 with a chief complaint of right knee pain, and found to have a diagnosis of right knee end stage osteoarthritis.  They were brought to the operating room on 11/15/2016 and underwent the above named procedures.    The patient had an uncomplicated hospital course and was stable for discharge.  Recent vital signs:  Vitals:   11/18/16 1406 11/18/16 2007  BP: (!) 130/57 (!) 114/43  Pulse: 73 70  Resp: 16 16  Temp: 98.5 F (36.9 C) 98.5 F (36.9 C)    Recent laboratory studies:  Results for orders placed or performed during the hospital encounter of 11/15/16  CBC  Result Value Ref Range   WBC 8.4 4.0 - 10.5 K/uL   RBC 4.30 3.87 - 5.11 MIL/uL   Hemoglobin 13.3 12.0 - 15.0 g/dL   HCT 41.7 36.0 - 46.0 %   MCV 97.0 78.0 - 100.0 fL   MCH 30.9 26.0 - 34.0 pg   MCHC 31.9 30.0 - 36.0 g/dL   RDW 12.9 11.5 - 15.5 %   Platelets 187 150 - 400 K/uL  Creatinine, serum  Result Value Ref Range   Creatinine, Ser 0.68 0.44 - 1.00 mg/dL   GFR calc non Af Amer >60 >60 mL/min   GFR calc Af Amer >60 >60 mL/min  Protime-INR  Result Value Ref Range   Prothrombin Time 14.1 11.4 - 15.2 seconds   INR 1.09   Protime-INR  Result Value Ref Range   Prothrombin Time 14.6 11.4 - 15.2 seconds   INR 1.14   CBC  Result Value Ref Range   WBC 10.2 4.0 - 10.5 K/uL   RBC 3.99 3.87 - 5.11 MIL/uL   Hemoglobin 12.4 12.0 - 15.0 g/dL   HCT 38.5 36.0 - 46.0 %   MCV 96.5 78.0 - 100.0 fL   MCH 31.1 26.0 - 34.0 pg   MCHC 32.2 30.0 - 36.0 g/dL   RDW  12.7 11.5 - 15.5 %   Platelets 202 150 - 400 K/uL  Basic metabolic panel  Result Value Ref Range   Sodium 138 135 - 145 mmol/L   Potassium 4.2 3.5 - 5.1 mmol/L   Chloride 103 101 - 111 mmol/L   CO2 27 22 - 32 mmol/L   Glucose, Bld 113 (H) 65 - 99 mg/dL   BUN 12 6 - 20 mg/dL   Creatinine, Ser 0.60 0.44 - 1.00 mg/dL   Calcium 8.8 (L) 8.9 - 10.3 mg/dL   GFR calc non Af Amer >60 >60 mL/min   GFR calc Af Amer >60 >60 mL/min   Anion gap 8 5 - 15  Protime-INR  Result Value Ref Range   Prothrombin Time 16.2 (H) 11.4 - 15.2 seconds   INR 1.29   CBC  Result Value Ref Range   WBC 11.3 (H) 4.0 - 10.5 K/uL   RBC 3.96 3.87 - 5.11 MIL/uL   Hemoglobin 12.3 12.0 - 15.0 g/dL   HCT 38.7 36.0 - 46.0 %   MCV 97.7 78.0 - 100.0 fL   MCH 31.1 26.0 - 34.0 pg  MCHC 31.8 30.0 - 36.0 g/dL   RDW 13.2 11.5 - 15.5 %   Platelets 197 150 - 400 K/uL  Urinalysis, Routine w reflex microscopic  Result Value Ref Range   Color, Urine YELLOW YELLOW   APPearance CLEAR CLEAR   Specific Gravity, Urine 1.009 1.005 - 1.030   pH 6.0 5.0 - 8.0   Glucose, UA NEGATIVE NEGATIVE mg/dL   Hgb urine dipstick SMALL (A) NEGATIVE   Bilirubin Urine NEGATIVE NEGATIVE   Ketones, ur NEGATIVE NEGATIVE mg/dL   Protein, ur 100 (A) NEGATIVE mg/dL   Nitrite NEGATIVE NEGATIVE   Leukocytes, UA NEGATIVE NEGATIVE   RBC / HPF 0-5 0 - 5 RBC/hpf   WBC, UA 0-5 0 - 5 WBC/hpf   Bacteria, UA RARE (A) NONE SEEN   Squamous Epithelial / LPF 0-5 (A) NONE SEEN   Mucous PRESENT    Hyaline Casts, UA PRESENT   Protime-INR  Result Value Ref Range   Prothrombin Time 16.9 (H) 11.4 - 15.2 seconds   INR 1.36   CBC  Result Value Ref Range   WBC 9.1 4.0 - 10.5 K/uL   RBC 3.61 (L) 3.87 - 5.11 MIL/uL   Hemoglobin 11.4 (L) 12.0 - 15.0 g/dL   HCT 35.3 (L) 36.0 - 46.0 %   MCV 97.8 78.0 - 100.0 fL   MCH 31.6 26.0 - 34.0 pg   MCHC 32.3 30.0 - 36.0 g/dL   RDW 13.4 11.5 - 15.5 %   Platelets 172 150 - 400 K/uL  Protime-INR  Result Value Ref Range    Prothrombin Time 19.3 (H) 11.4 - 15.2 seconds   INR 1.60     Discharge Medications:   Allergies as of 11/19/2016      Reactions   Moxifloxacin Other (See Comments)   YEAST SUPERINFECTION   Penicillins Itching, Other (See Comments)   YEAST SUPERINFECTION DUE TO PCN USE Has patient had a PCN reaction causing immediate rash, facial/tongue/throat swelling, SOB or lightheadedness with hypotension: No Has patient had a PCN reaction causing severe rash involving mucus membranes or skin necrosis: No Has patient had a PCN reaction that required hospitalization No Has patient had a PCN reaction occurring within the last 10 years: No If all of the above answers are "NO", then may proceed with Cephalosporin use.   Quinine Derivatives Other (See Comments)   DIZZY "CRAZY"   Pramipexole Dihydrochloride    UNSPECIFIED REACTION    Ropinirole Hydrochloride Other (See Comments)   UNSPECIFIED REACTION    Codeine Nausea And Vomiting      Medication List    TAKE these medications   acetaminophen 500 MG tablet Commonly known as:  TYLENOL Take 500 mg by mouth daily as needed for moderate pain or headache.   ALPRAZolam 0.25 MG tablet Commonly known as:  XANAX TAKE 1 TABLET BY MOUTH TWICE DAILY AS NEEDED What changed:  See the new instructions.   amLODipine 2.5 MG tablet Commonly known as:  NORVASC TAKE 1 TABLET(2.5 MG) BY MOUTH DAILY   ANUCORT-HC 25 MG suppository Generic drug:  hydrocortisone UNWRAP AND INSERT 1 SUPPOSITORY RECTALLY TWICE DAILY AS DIRECTED What changed:  See the new instructions.   aspirin EC 325 MG tablet Take 650 mg by mouth daily as needed for moderate pain.   BIOTIN 5000 5 MG Caps Generic drug:  Biotin Take 5,000 mg by mouth daily.   clobetasol cream 0.05 % Commonly known as:  TEMOVATE Apply topically 2 (two) times daily. Apply topically. What changed:  how much to take  when to take this  reasons to take this  additional instructions   CRANBERRY FRUIT  PO Take 2 tablets by mouth daily.   CULTURELLE DIGESTIVE HEALTH Chew Chew 1 capsule by mouth daily.   docusate sodium 100 MG capsule Commonly known as:  COLACE Take 100 mg by mouth at bedtime.   enoxaparin 40 MG/0.4ML injection Commonly known as:  LOVENOX Inject 0.4 mLs (40 mg total) into the skin daily. 30 days post op   EPIPEN 2-PAK 0.3 mg/0.3 mL Soaj injection Generic drug:  EPINEPHrine Inject 0.3 mg into the skin as needed (allergic reaction).   estradiol 0.1 MG/GM vaginal cream Commonly known as:  ESTRACE Place 1 Applicatorful vaginally 2 (two) times a week.   fexofenadine 180 MG tablet Commonly known as:  ALLEGRA Take 180 mg by mouth daily as needed for allergies.   fluconazole 100 MG tablet Commonly known as:  DIFLUCAN 1 by mouth once daily for 3 days as needed   gabapentin 300 MG capsule Commonly known as:  NEURONTIN TAKE 2 CAPSULE BY MOUTH THREE TIMES DAILY What changed:  how much to take  how to take this  when to take this  additional instructions   gentamicin ointment 0.1 % Commonly known as:  GARAMYCIN Apply 1 application topically 3 (three) times daily. Apply ointment to nose TID What changed:  when to take this  additional instructions   hydrocortisone 2.5 % rectal cream Commonly known as:  PROCTOZONE-HC APPLY CREAM RECTALLY TWICE DAILY AS DIRECTED   meloxicam 7.5 MG tablet Commonly known as:  MOBIC TAKE 1 TABLET(7.5 MG) BY MOUTH DAILY   methocarbamol 500 MG tablet Commonly known as:  ROBAXIN Take 1 tablet (500 mg total) by mouth 3 (three) times daily as needed.   MULTIVITAMIN PO Take 2 tablets by mouth daily.   nitrofurantoin (macrocrystal-monohydrate) 100 MG capsule Commonly known as:  MACROBID Take 100 mg by mouth as needed. For bladder infection   nystatin cream Commonly known as:  MYCOSTATIN Apply 1 application topically as needed for dry skin.   oxyCODONE-acetaminophen 5-325 MG tablet Commonly known as:  ROXICET Take  1-2 tablets by mouth every 4 (four) hours as needed for severe pain.   pravastatin 40 MG tablet Commonly known as:  PRAVACHOL TAKE 1 TABLET(40 MG) BY MOUTH DAILY   sertraline 100 MG tablet Commonly known as:  ZOLOFT TAKE 2 TABLETS BY MOUTH EVERY DAY   terconazole 0.4 % vaginal cream Commonly known as:  TERAZOL 7 Place 1 applicator vaginally daily as needed (discomfort).   timolol 0.5 % ophthalmic solution Commonly known as:  BETIMOL Place 1 drop into both eyes daily.   traMADol 50 MG tablet Commonly known as:  ULTRAM Take 1-2 tablets (50-100 mg total) by mouth every 6 (six) hours as needed for moderate pain. What changed:  how much to take  when to take this  reasons to take this   warfarin 5 MG tablet Commonly known as:  COUMADIN Take 1 tablet (5 mg total) by mouth daily. Take as directed per the pharmacist for INR target from 2.5-3.0 for 30 days post op   XALATAN 0.005 % ophthalmic solution Generic drug:  latanoprost Place 1 drop into both eyes at bedtime.       Diagnostic Studies: Dg Knee Right Port  Result Date: 11/15/2016 CLINICAL DATA:  Status post right total knee arthroplasty. EXAM: PORTABLE RIGHT KNEE - 1-2 VIEW COMPARISON:  None. FINDINGS: The femoral and tibial components are  well situated. No fracture or dislocation is noted. Expected postoperative changes are seen in the soft tissues anteriorly. IMPRESSION: Status post right total knee arthroplasty. Electronically Signed   By: Marijo Conception, M.D.   On: 11/15/2016 15:38    Disposition: 01-Home or Self Care  Discharge Instructions    Call MD / Call 911    Complete by:  As directed    If you experience chest pain or shortness of breath, CALL 911 and be transported to the hospital emergency room.  If you develope a fever above 101 F, pus (white drainage) or increased drainage or redness at the wound, or calf pain, call your surgeon's office.   Constipation Prevention    Complete by:  As directed     Drink plenty of fluids.  Prune juice may be helpful.  You may use a stool softener, such as Colace (over the counter) 100 mg twice a day.  Use MiraLax (over the counter) for constipation as needed.   Diet - low sodium heart healthy    Complete by:  As directed    Increase activity slowly as tolerated    Complete by:  As directed       Follow-up Information    NORRIS,STEVEN R, MD. Call in 2 weeks.   Specialty:  Orthopedic Surgery Why:  [id number] Contact information: [address] [place] [address] [phone]            Signed: Ventura Bruns 11/19/2016, 8:53 AM

## 2016-11-19 NOTE — Progress Notes (Signed)
Physical Therapy Treatment Patient Details Name: [name] MRN: [id number] DOB: 11-Nov-1936 Today's Date: 11/19/2016    History of Present Illness 81 y.o. female admitted to [facility] on 11/15/16 for elective R TKA.  Pt with significant PMHx of sciatica L, paresthesia (hands), HTN, glaucoma, DVT, lumbar disc disease, R foot surgery, back surgery, cervical fusion.      PT Comments    Continuing improvements in activity tolerance, functional mobility, and R knee stability in standing; No need for KI, as the R knee is nice and stable in stance; Noted for dc to SNF this afternoon  Follow Up Recommendations  SNF     Equipment Recommendations  Rolling walker with 5" wheels;3in1 (PT)    Recommendations for Other Services       Precautions / Restrictions Precautions Precautions: Knee Precaution Booklet Issued: Yes (comment) Precaution Comments: reviewed no pillow under knee Required Braces or Orthoses: Knee Immobilizer - Right (did not use) Restrictions RLE Weight Bearing: Weight bearing as tolerated    Mobility  Bed Mobility Overal bed mobility: Needs Assistance Bed Mobility: Supine to Sit     Supine to sit: Min guard (without physical contact)     General bed mobility comments: Cues for technique; slow moving, but not needing physical assist  Transfers Overall transfer level: Needs assistance Equipment used: Rolling walker (2 wheeled) Transfers: Sit to/from Stand Sit to Stand: Min guard         General transfer comment: Cues for hand placement, technqiue and safety; minguard for safety, especially while moving hands to RW  Ambulation/Gait Ambulation/Gait assistance: Min guard Ambulation Distance (Feet): 60 Feet Assistive device: Rolling walker (2 wheeled) Gait Pattern/deviations: Step-through pattern;Decreased stance time - right;Decreased step length - left;Trunk flexed (step-through pattern emerging) Gait velocity: decreased   General Gait Details: verbal and tacile  cueing for posture, to activate R quad for stance stability, and to increase L step length   Stairs            Wheelchair Mobility    Modified Rankin (Stroke Patients Only)       Balance     Sitting balance-Leahy Scale: Fair       Standing balance-Leahy Scale: Poor (approaching Fair)                      Cognition Arousal/Alertness: Awake/alert Behavior During Therapy: WFL for tasks assessed/performed Overall Cognitive Status: Within Functional Limits for tasks assessed                 General Comments: Was able to remember to not let anyone place a pillow under knee; unable to remember why    Exercises Total Joint Exercises Ankle Circles/Pumps: AROM;Both;10 reps;Supine Quad Sets: AROM;Right;20 reps Short Arc Quad: AROM;Right;10 reps Heel Slides: AAROM;Right;10 reps Hip ABduction/ADduction: AROM;Right;10 reps Straight Leg Raises: AAROM;Right;10 reps (needing just minimal assist) Goniometric ROM: approx 8-70 deg    General Comments        Pertinent Vitals/Pain Pain Assessment: Faces Faces Pain Scale: Hurts even more Pain Location: with knee flexion; subsides quickly Pain Descriptors / Indicators: Aching;Grimacing;Guarding Pain Intervention(s): Monitored during session    Home Living                      Prior Function            PT Goals (current goals can now be found in the care plan section) Acute Rehab PT Goals Patient Stated Goal: rehab then home PT Goal  Formulation: With patient Time For Goal Achievement: 11/22/16 Potential to Achieve Goals: Good Progress towards PT goals: Progressing toward goals    Frequency    7X/week      PT Plan Current plan remains appropriate    Co-evaluation             End of Session Equipment Utilized During Treatment: Gait belt Activity Tolerance: Patient tolerated treatment well Patient left: with call bell/phone within reach;Other (comment) (in bathroom on commode)      Time: [id number] PT Time Calculation (min) (ACUTE ONLY): 23 min  Charges:  $Gait Training: 8-22 mins $Therapeutic Exercise: 8-22 mins $Therapeutic Activity: 8-22 mins                    G Codes:      Colletta [place] 01-Dec-2016, 2:23 PM  Roney Marion, [place] Pager [phone] Office [phone]

## 2016-11-19 NOTE — Clinical Social Work Note (Signed)
Clinical Social Worker facilitated patient discharge including contacting patient family and facility to confirm patient discharge plans.  Clinical information faxed to facility and family agreeable with plan.  CSW arranged ambulance transport via [facility] to [facility] in [place] .  RN to call [phone] extension [phone] or [phone] for report prior to discharge. Patient will be going to room 713.  Clinical Social Worker will sign off for now as social work intervention is no longer needed. Please consult [place] again if new need arises.  [address] [place]

## 2016-12-10 ENCOUNTER — Other Ambulatory Visit: Payer: Self-pay | Admitting: Internal Medicine

## 2016-12-10 ENCOUNTER — Ambulatory Visit: Payer: Medicare Other | Admitting: Internal Medicine

## 2016-12-19 ENCOUNTER — Encounter: Payer: Medicare Other | Admitting: Internal Medicine

## 2016-12-19 ENCOUNTER — Other Ambulatory Visit (INDEPENDENT_AMBULATORY_CARE_PROVIDER_SITE_OTHER): Payer: Medicare Other

## 2016-12-19 ENCOUNTER — Ambulatory Visit (INDEPENDENT_AMBULATORY_CARE_PROVIDER_SITE_OTHER): Payer: Medicare Other | Admitting: Internal Medicine

## 2016-12-19 VITALS — BP 138/78 | HR 66 | Temp 98.1°F | Resp 20 | Wt 180.0 lb

## 2016-12-19 DIAGNOSIS — Z0001 Encounter for general adult medical examination with abnormal findings: Secondary | ICD-10-CM

## 2016-12-19 DIAGNOSIS — R7302 Impaired glucose tolerance (oral): Secondary | ICD-10-CM | POA: Diagnosis not present

## 2016-12-19 DIAGNOSIS — E2839 Other primary ovarian failure: Secondary | ICD-10-CM

## 2016-12-19 DIAGNOSIS — I10 Essential (primary) hypertension: Secondary | ICD-10-CM | POA: Diagnosis not present

## 2016-12-19 LAB — BASIC METABOLIC PANEL
BUN: 14 mg/dL (ref 6–23)
CALCIUM: 9.4 mg/dL (ref 8.4–10.5)
CO2: 31 meq/L (ref 19–32)
CREATININE: 0.71 mg/dL (ref 0.40–1.20)
Chloride: 102 mEq/L (ref 96–112)
GFR: 84.51 mL/min (ref >60.00)
GLUCOSE: 89 mg/dL (ref 70–99)
Potassium: 4.8 mEq/L (ref 3.5–5.1)
SODIUM: 137 meq/L (ref 135–145)

## 2016-12-19 LAB — LIPID PANEL
CHOLESTEROL: 174 mg/dL (ref 0–200)
HDL: 49.4 mg/dL (ref >39.00)
LDL Cholesterol: 101 mg/dL — ABNORMAL HIGH (ref 0–99)
NonHDL: 124.25
TRIGLYCERIDES: 118 mg/dL (ref 0.0–149.0)
Total CHOL/HDL Ratio: 4
VLDL: 23.6 mg/dL (ref 0.0–40.0)

## 2016-12-19 LAB — URINALYSIS, ROUTINE W REFLEX MICROSCOPIC
BILIRUBIN URINE: NEGATIVE
HGB URINE DIPSTICK: NEGATIVE
Ketones, ur: NEGATIVE
LEUKOCYTES UA: NEGATIVE
Nitrite: NEGATIVE
Specific Gravity, Urine: <=1.005 — AB (ref 1.000–1.030)
TOTAL PROTEIN, URINE-UPE24: NEGATIVE
URINE GLUCOSE: NEGATIVE
UROBILINOGEN UA: 0.2 (ref 0.0–1.0)
pH: 6 (ref 5.0–8.0)

## 2016-12-19 LAB — CBC WITH DIFFERENTIAL/PLATELET
BASOS ABS: 0.1 10*3/uL (ref 0.0–0.1)
Basophils Relative: 1 % (ref 0.0–3.0)
Eosinophils Absolute: 0.5 10*3/uL (ref 0.0–0.7)
Eosinophils Relative: 5.8 % — ABNORMAL HIGH (ref 0.0–5.0)
HCT: 37.4 % (ref 36.0–46.0)
Hemoglobin: 12.2 g/dL (ref 12.0–15.0)
LYMPHS ABS: 2.3 10*3/uL (ref 0.7–4.0)
Lymphocytes Relative: 26.5 % (ref 12.0–46.0)
MCHC: 32.7 g/dL (ref 30.0–36.0)
MCV: 95.2 fl (ref 78.0–100.0)
MONOS PCT: 7.4 % (ref 3.0–12.0)
Monocytes Absolute: 0.6 10*3/uL (ref 0.1–1.0)
NEUTROS PCT: 59.3 % (ref 43.0–77.0)
Neutro Abs: 5 10*3/uL (ref 1.4–7.7)
Platelets: 274 10*3/uL (ref 150.0–400.0)
RBC: 3.93 Mil/uL (ref 3.87–5.11)
RDW: 14.2 % (ref 11.5–15.5)
WBC: 8.5 10*3/uL (ref 4.0–10.5)

## 2016-12-19 LAB — HEPATIC FUNCTION PANEL
ALBUMIN: 4.1 g/dL (ref 3.5–5.2)
ALK PHOS: 70 U/L (ref 39–117)
ALT: 48 U/L — ABNORMAL HIGH (ref 0–35)
AST: 21 U/L (ref 0–37)
Bilirubin, Direct: 0.1 mg/dL (ref 0.0–0.3)
Total Bilirubin: 0.4 mg/dL (ref 0.2–1.2)
Total Protein: 7.4 g/dL (ref 6.0–8.3)

## 2016-12-19 LAB — TSH: TSH: 3.27 u[IU]/mL (ref 0.35–4.50)

## 2016-12-19 NOTE — Progress Notes (Signed)
Subjective:    Patient ID: [name], female    DOB: 05-18-1936, 81 y.o.   MRN: [id number]  HPI  Here for wellness and f/u;  Overall doing ok;  Pt denies Chest pain, worsening SOB, DOE, wheezing, orthopnea, PND, worsening LE edema, palpitations, dizziness or syncope.  Pt denies neurological change such as new headache, facial or extremity weakness.  Pt denies polydipsia, polyuria, or low sugar symptoms. Pt states overall good compliance with treatment and medications, good tolerability, and has been trying to follow appropriate diet.  Pt denies worsening depressive symptoms, suicidal ideation or panic. No fever, night sweats, wt loss, loss of appetite, or other constitutional symptoms.  Pt states good ability with ADL's, has low fall risk, home safety reviewed and adequate, no other significant changes in hearing or vision, and only occasionally active with exercise. S/p right knee TKA about 5 wks ago. No other new history Past Medical History:  Diagnosis Date  . Allergy   . Chalazion 10/30/2007  . DEPRESSION 06/13/2007  . [place] DISEASE, LUMBAR 06/21/2009  . DIVERTICULOSIS, COLON 06/22/2008  . DVT, HX OF 06/13/2007  . FATIGUE 06/21/2009  . GERD 06/13/2007  . Glaucoma   . HYPERLIPIDEMIA 06/22/2008  . HYPERTENSION 10/30/2007  . Impaired glucose tolerance 04/02/2012  . OSTEOPENIA 06/13/2007  . PARESTHESIA, HANDS 10/30/2007  . Preventative health care 03/31/2011  . Restless leg   . SCIATICA, LEFT 12/28/2008  . Urinary tract infection    frequently   Past Surgical History:  Procedure Laterality Date  . BACK SURGERY  2011  . BREAST LUMPECTOMY Left April 2008  . CERVICAL FUSION  2011  . EYE SURGERY     cataract left  . FOOT SURGERY Right 1998  . KNEE ARTHROSCOPY Left 2002  . s/p right cataract   Feb. 2012  . TOTAL KNEE ARTHROPLASTY Right 11/15/2016   Procedure: RIGHT TOTAL KNEE ARTHROPLASTY;  Surgeon: Netta Cedars, MD;  Location: [facility];  Service: Orthopedics;  Laterality: Right;  . TUBAL  LIGATION  1970    reports that she has never smoked. She has never used smokeless tobacco. She reports that she does not drink alcohol or use drugs. family history includes Alzheimer's disease in her mother and sister; Cancer in her father; Hypertension in her mother; Parkinsonism in her mother. Allergies  Allergen Reactions  . Moxifloxacin Other (See Comments)    YEAST SUPERINFECTION  . Penicillins Itching and Other (See Comments)    YEAST SUPERINFECTION DUE TO PCN USE  Has patient had a PCN reaction causing immediate rash, facial/tongue/throat swelling, SOB or lightheadedness with hypotension: No Has patient had a PCN reaction causing severe rash involving mucus membranes or skin necrosis: No Has patient had a PCN reaction that required hospitalization No Has patient had a PCN reaction occurring within the last 10 years: No If all of the above answers are "NO", then may proceed with Cephalosporin use.   . Quinine Derivatives Other (See Comments)    DIZZY "CRAZY"  . Pramipexole Dihydrochloride     UNSPECIFIED REACTION   . Ropinirole Hydrochloride Other (See Comments)    UNSPECIFIED REACTION   . Codeine Nausea And Vomiting   Current Outpatient Prescriptions on File Prior to Visit  Medication Sig Dispense Refill  . acetaminophen (TYLENOL) 500 MG tablet Take 500 mg by mouth daily as needed for moderate pain or headache.     . ALPRAZolam (XANAX) 0.25 MG tablet TAKE 1 TABLET BY MOUTH TWICE DAILY AS NEEDED (Patient taking differently: Take  0.25mg  by mouth every morning) 60 tablet 2  . amLODipine (NORVASC) 2.5 MG tablet TAKE 1 TABLET(2.5 MG) BY MOUTH DAILY 90 tablet 0  . ANUCORT-HC 25 MG suppository UNWRAP AND INSERT 1 SUPPOSITORY RECTALLY TWICE DAILY AS DIRECTED (Patient taking differently: UNWRAP AND INSERT 1 SUPPOSITORY RECTALLY TWICE DAILY AS NEEDED FOR HEMORRHOIDS) 12 suppository 0  . aspirin EC 325 MG tablet Take 650 mg by mouth daily as needed for moderate pain.    . Biotin (BIOTIN  5000) 5 MG CAPS Take 5,000 mg by mouth daily.    . clobetasol cream (TEMOVATE) 0.05 % Apply topically 2 (two) times daily. Apply topically. (Patient taking differently: Apply 1 application topically daily as needed (rash). ) 30 g 1  . CRANBERRY FRUIT PO Take 2 tablets by mouth daily.     Marland Kitchen docusate sodium (COLACE) 100 MG capsule Take 100 mg by mouth at bedtime.     . enoxaparin (LOVENOX) 40 MG/0.4ML injection Inject 0.4 mLs (40 mg total) into the skin daily. 30 days post op 4 mL 0  . EPIPEN 2-PAK 0.3 MG/0.3ML SOAJ injection Inject 0.3 mg into the skin as needed (allergic reaction).   12  . estradiol (ESTRACE) 0.1 MG/GM vaginal cream Place 1 Applicatorful vaginally 2 (two) times a week.    . fexofenadine (ALLEGRA) 180 MG tablet Take 180 mg by mouth daily as needed for allergies.     . fluconazole (DIFLUCAN) 100 MG tablet 1 by mouth once daily for 3 days as needed 12 tablet 2  . gabapentin (NEURONTIN) 300 MG capsule TAKE 2 CAPSULE BY MOUTH THREE TIMES DAILY (Patient taking differently: Take 600 mg by mouth 2 (two) times daily. ) 540 capsule 1  . gentamicin ointment (GARAMYCIN) 0.1 % Apply 1 application topically 3 (three) times daily. Apply ointment to nose TID (Patient taking differently: Apply 1 application topically 2 (two) times daily. Apply ointment to nose) 15 g 15  . hydrocortisone (PROCTOZONE-HC) 2.5 % rectal cream APPLY CREAM RECTALLY TWICE DAILY AS DIRECTED 30 g 0  . Lactobacillus-Inulin (CULTURELLE DIGESTIVE HEALTH) CHEW Chew 1 capsule by mouth daily.     Marland Kitchen latanoprost (XALATAN) 0.005 % ophthalmic solution Place 1 drop into both eyes at bedtime.      . meloxicam (MOBIC) 7.5 MG tablet TAKE 1 TABLET(7.5 MG) BY MOUTH DAILY 30 tablet 0  . methocarbamol (ROBAXIN) 500 MG tablet Take 1 tablet (500 mg total) by mouth 3 (three) times daily as needed. 60 tablet 0  . Multiple Vitamins-Minerals (MULTIVITAMIN PO) Take 2 tablets by mouth daily.     . nitrofurantoin, macrocrystal-monohydrate, (MACROBID)  100 MG capsule Take 100 mg by mouth as needed. For bladder infection  3  . nystatin cream (MYCOSTATIN) Apply 1 application topically as needed for dry skin.    Marland Kitchen oxyCODONE-acetaminophen (ROXICET) 5-325 MG tablet Take 1-2 tablets by mouth every 4 (four) hours as needed for severe pain. 60 tablet 0  . pravastatin (PRAVACHOL) 40 MG tablet TAKE 1 TABLET(40 MG) BY MOUTH DAILY 90 tablet 0  . sertraline (ZOLOFT) 100 MG tablet TAKE 2 TABLETS BY MOUTH DAILY 180 tablet 2  . terconazole (TERAZOL 7) 0.4 % vaginal cream Place 1 applicator vaginally daily as needed (discomfort).    . timolol (BETIMOL) 0.5 % ophthalmic solution Place 1 drop into both eyes daily.    . traMADol (ULTRAM) 50 MG tablet Take 1-2 tablets (50-100 mg total) by mouth every 6 (six) hours as needed for moderate pain. 60 tablet 0  .  warfarin (COUMADIN) 5 MG tablet Take 1 tablet (5 mg total) by mouth daily. Take as directed per the pharmacist for INR target from 2.5-3.0 for 30 days post op 40 tablet 0   No current facility-administered medications on file prior to visit.    Review of Systems Constitutional: Negative for increased diaphoresis, or other activity, appetite or siginficant weight change other than noted HENT: Negative for worsening hearing loss, ear pain, facial swelling, mouth sores and neck stiffness.   Eyes: Negative for other worsening pain, redness or visual disturbance.  Respiratory: Negative for choking or stridor Cardiovascular: Negative for other chest pain and palpitations.  Gastrointestinal: Negative for worsening diarrhea, blood in stool, or abdominal distention Genitourinary: Negative for hematuria, flank pain or change in urine volume.  Musculoskeletal: Negative for myalgias or other joint complaints.  Skin: Negative for other color change and wound or drainage.  Neurological: Negative for syncope and numbness. other than noted Hematological: Negative for adenopathy. or other swelling Psychiatric/Behavioral:  Negative for hallucinations, SI, self-injury, decreased concentration or other worsening agitation.  All other system neg per pt    Objective:   Physical Exam BP 138/78   Pulse 66   Temp 98.1 F (36.7 C) (Oral)   Resp 20   Wt 180 lb (81.6 kg)   SpO2 91%   BMI 29.50 kg/m  VS noted,  Constitutional: Pt is oriented to person, place, and time. Appears well-developed and well-nourished, in no significant distress Head: Normocephalic and atraumatic  Eyes: Conjunctivae and EOM are normal. Pupils are equal, round, and reactive to light Right Ear: External ear normal.  Left Ear: External ear normal Nose: Nose normal.  Mouth/Throat: Oropharynx is clear and moist  Neck: Normal range of motion. Neck supple. No JVD present. No tracheal deviation present or significant neck LA or mass Cardiovascular: Normal rate, regular rhythm, normal heart sounds and intact distal pulses.   Pulmonary/Chest: Effort normal and breath sounds without rales or wheezing  Abdominal: Soft. Bowel sounds are normal. NT. No HSM  Musculoskeletal: Normal range of motion. Exhibits no edema Lymphadenopathy: Has no cervical adenopathy.  Neurological: Pt is alert and oriented to person, place, and time. Pt has normal reflexes. No cranial nerve deficit. Motor grossly intact Skin: Skin is warm and dry. No rash noted or new ulcers Psychiatric:  Has normal mood and affect. Behavior is normal.  No other new exam findings    Assessment & Plan:

## 2016-12-19 NOTE — Patient Instructions (Addendum)
Please continue all other medications as before, and refills have been done if requested.  Please have the pharmacy call with any other refills you may need.  Please continue your efforts at being more active, low cholesterol diet, and weight control.  You are otherwise up to date with prevention measures today.  Please keep your appointments with your specialists as you may have planned - physical therapy, and orthopedic  Please schedule the bone density test before leaving today at the scheduling desk (where you check out)  Please go to the LAB in the Basement (turn left off the elevator) for the tests to be done today  You will be contacted by phone if any changes need to be made immediately.  Otherwise, you will receive a letter about your results with an explanation, but please check with MyChart first.  Please remember to sign up for MyChart if you have not done so, as this will be important to you in the future with finding out test results, communicating by private email, and scheduling acute appointments online when needed.  Please return in 6 months, or sooner if needed

## 2016-12-19 NOTE — Progress Notes (Signed)
Pre visit review using our clinic review tool, if applicable. No additional management support is needed unless otherwise documented below in the visit note. 

## 2016-12-20 NOTE — Assessment & Plan Note (Signed)
stable overall by history and exam, recent data reviewed with pt, and pt to continue medical treatment as before,  to f/u any worsening symptoms or concerns BP Readings from Last 3 Encounters:  12/19/16 138/78  11/19/16 117/63  11/04/16 (!) 138/46

## 2016-12-20 NOTE — Assessment & Plan Note (Signed)

## 2016-12-20 NOTE — Assessment & Plan Note (Signed)
stable overall by history and exam, recent data reviewed with pt, and pt to continue medical treatment as before,  to f/u any worsening symptoms or concerns Lab Results  Component Value Date   HGBA1C 5.6 12/20/2015   

## 2016-12-21 ENCOUNTER — Other Ambulatory Visit: Payer: Self-pay | Admitting: Internal Medicine

## 2016-12-27 ENCOUNTER — Other Ambulatory Visit: Payer: Self-pay | Admitting: Internal Medicine

## 2017-01-20 ENCOUNTER — Ambulatory Visit (INDEPENDENT_AMBULATORY_CARE_PROVIDER_SITE_OTHER)
Admission: RE | Admit: 2017-01-20 | Discharge: 2017-01-20 | Disposition: A | Discharge status: Home / Self Care | Payer: Medicare Other | Source: Ambulatory Visit | Attending: Internal Medicine | Admitting: Internal Medicine

## 2017-01-20 DIAGNOSIS — E2839 Other primary ovarian failure: Secondary | ICD-10-CM | POA: Diagnosis not present

## 2017-02-03 ENCOUNTER — Other Ambulatory Visit: Payer: Self-pay | Admitting: Internal Medicine

## 2017-02-04 NOTE — Telephone Encounter (Signed)
Faced script bck to [facility]...Johny Chess

## 2017-02-04 NOTE — Telephone Encounter (Signed)
Done hardcopy to Shirron  

## 2017-02-17 ENCOUNTER — Other Ambulatory Visit: Payer: Self-pay | Admitting: Internal Medicine

## 2017-02-18 NOTE — Telephone Encounter (Signed)
Done erx 

## 2017-03-01 ENCOUNTER — Other Ambulatory Visit: Payer: Self-pay | Admitting: Internal Medicine

## 2017-03-03 NOTE — Telephone Encounter (Signed)
Routing to dr john, please advise, thanks 

## 2017-03-12 ENCOUNTER — Other Ambulatory Visit: Payer: Self-pay | Admitting: Internal Medicine

## 2017-03-24 ENCOUNTER — Other Ambulatory Visit: Payer: Self-pay | Admitting: Internal Medicine

## 2017-03-31 ENCOUNTER — Other Ambulatory Visit: Payer: Self-pay | Admitting: Internal Medicine

## 2017-04-01 NOTE — Telephone Encounter (Signed)
Done hardcopy to Shirron  

## 2017-04-01 NOTE — Telephone Encounter (Signed)
Faxed

## 2017-04-11 ENCOUNTER — Other Ambulatory Visit: Payer: Self-pay | Admitting: Internal Medicine

## 2017-06-12 ENCOUNTER — Other Ambulatory Visit: Payer: Self-pay | Admitting: Internal Medicine

## 2017-06-18 ENCOUNTER — Other Ambulatory Visit: Payer: Self-pay | Admitting: Gynecology

## 2017-06-18 DIAGNOSIS — C50912 Malignant neoplasm of unspecified site of left female breast: Secondary | ICD-10-CM

## 2017-06-18 HISTORY — DX: Malignant neoplasm of unspecified site of left female breast: C50.912

## 2017-06-19 ENCOUNTER — Other Ambulatory Visit: Payer: Self-pay | Admitting: Gynecology

## 2017-06-19 DIAGNOSIS — N6320 Unspecified lump in the left breast, unspecified quadrant: Secondary | ICD-10-CM

## 2017-06-19 DIAGNOSIS — N6489 Other specified disorders of breast: Secondary | ICD-10-CM

## 2017-06-23 ENCOUNTER — Ambulatory Visit
Admission: RE | Admit: 2017-06-23 | Discharge: 2017-06-23 | Disposition: A | Discharge status: Home / Self Care | Payer: Medicare Other | Source: Ambulatory Visit | Attending: Gynecology | Admitting: Gynecology

## 2017-06-23 ENCOUNTER — Other Ambulatory Visit: Payer: Self-pay | Admitting: Gynecology

## 2017-06-23 DIAGNOSIS — N6489 Other specified disorders of breast: Secondary | ICD-10-CM

## 2017-06-23 DIAGNOSIS — N6320 Unspecified lump in the left breast, unspecified quadrant: Secondary | ICD-10-CM

## 2017-06-23 DIAGNOSIS — N6310 Unspecified lump in the right breast, unspecified quadrant: Secondary | ICD-10-CM

## 2017-06-24 ENCOUNTER — Other Ambulatory Visit: Payer: Self-pay | Admitting: Gynecology

## 2017-06-24 DIAGNOSIS — N6310 Unspecified lump in the right breast, unspecified quadrant: Secondary | ICD-10-CM

## 2017-06-25 ENCOUNTER — Other Ambulatory Visit: Payer: Self-pay | Admitting: Gynecology

## 2017-06-25 ENCOUNTER — Ambulatory Visit
Admission: RE | Admit: 2017-06-25 | Discharge: 2017-06-25 | Disposition: A | Discharge status: Home / Self Care | Payer: Medicare Other | Source: Ambulatory Visit | Attending: Gynecology | Admitting: Gynecology

## 2017-06-25 DIAGNOSIS — N6320 Unspecified lump in the left breast, unspecified quadrant: Secondary | ICD-10-CM

## 2017-06-25 DIAGNOSIS — N6310 Unspecified lump in the right breast, unspecified quadrant: Secondary | ICD-10-CM

## 2017-07-01 ENCOUNTER — Ambulatory Visit: Payer: Medicare Other | Admitting: Internal Medicine

## 2017-07-02 ENCOUNTER — Ambulatory Visit: Payer: Self-pay | Admitting: Surgery

## 2017-07-02 ENCOUNTER — Other Ambulatory Visit: Payer: Self-pay | Admitting: Surgery

## 2017-07-02 DIAGNOSIS — Z170 Estrogen receptor positive status [ER+]: Principal | ICD-10-CM

## 2017-07-02 DIAGNOSIS — C50412 Malignant neoplasm of upper-outer quadrant of left female breast: Secondary | ICD-10-CM

## 2017-07-02 NOTE — H&P (Signed)
[name] 07/02/2017 2:35 PM Location: [facility] Patient #: [id number] DOB: 05-10-1936 Widowed / Language: [name] / Race: White Female  History of Present Illness [name] A. Daud Cayer MD; 07/02/2017 3:18 PM) Patient words: Patient sent at the request of Dr. Rosana Hoes for mammographic abnormality detected during screening mammogram. The patient had a subcentimeter left breast mass in the upper quadrant. This was core biopsied and found to be consistent with invasive ductal carcinoma ER positive. Positive HER-2/neu negative. Size was about 1 cm in maximal diameter. Patient denies any history of breast pain nipple discharge or problem with breast issues. Right breast biopsy showed fibrocystic disease                        CLINICAL DATA: Patient recalled from screening for left breast mass and right breast asymmetry.  EXAM: 2D DIGITAL DIAGNOSTIC BILATERAL MAMMOGRAM WITH CAD AND ADJUNCT TOMO  ULTRASOUND BILATERAL BREAST  COMPARISON: Previous exam(s).  ACR Breast Density Category c: The breast tissue is heterogeneously dense, which may obscure small masses.  FINDINGS: Excisional biopsy changes are demonstrated within the lateral left breast. Within the more central aspect of the left breast, slightly laterally there is a 14 mm irregular mass with associated architectural distortion, further evaluated with spot compression CC and MLO tomosynthesis images.  Within the upper-outer right breast posterior depth there is a small focal area of architectural distortion.  Mammographic images were processed with CAD.  On physical exam, I palpate no discrete mass with central left or upper-outer right breast.  Targeted ultrasound is performed, showing shadowing within the periareolar left breast which may be secondary to postsurgical scarring. Defined mass is not able to be distinguished within this area given postsurgical changes. No left axillary  adenopathy.  Within the right breast 10 o'clock position 7 cm from nipple there is a 5 x 4 x 5 mm irregular hypoechoic mass with posterior acoustic shadowing. No right axillary lymphadenopathy.  IMPRESSION: Suspicious irregular mass within the central to slightly lateral left breast without definable sonographic correlate.  Suspicious distortion upper outer right breast with sonographic correlate.  RECOMMENDATION: Stereotactic guided core needle biopsy suspicious left breast mass with associated distortion.  Ultrasound-guided core needle biopsy focal distortion with irregular mass upper outer right breast, 10 o'clock position 7 cm from the nipple.  I have discussed the findings and recommendations with the patient. Results were also provided in writing at the conclusion of the visit. If applicable, a reminder letter will be sent to the patient regarding the next appointment.  BI-RADS CATEGORY 4: Suspicious.   Electronically Signed By: Lovey Newcomer M.[name]. On: 06/23/2017 11:43             ADDITIONAL INFORMATION: 1. PROGNOSTIC INDICATORS Results: IMMUNOHISTOCHEMICAL AND MORPHOMETRIC ANALYSIS PERFORMED MANUALLY Estrogen Receptor: 100%, POSITIVE, STRONG STAINING INTENSITY Progesterone Receptor: 100%, POSITIVE, STRONG STAINING INTENSITY Proliferation Marker Ki67: 5% REFERENCE RANGE ESTROGEN RECEPTOR NEGATIVE 0% POSITIVE =>1% REFERENCE RANGE PROGESTERONE RECEPTOR NEGATIVE 0% POSITIVE =>1% All controls stained appropriately Claudette Laws MD Pathologist, Electronic Signature ( Signed 07/01/2017) 1. FLUORESCENCE IN-SITU HYBRIDIZATION Results: HER2 - NEGATIVE RATIO OF HER2/CEP17 SIGNALS 1.29 AVERAGE HER2 COPY NUMBER PER CELL 1.80 Reference Range: NEGATIVE HER2/CEP17 Ratio <2.0 and average HER2 copy number <4.0 EQUIVOCAL HER2/CEP17 Ratio <2.0 and average HER2 copy number >=4.0 and <6.0 1 of 3 FINAL for LEILANIE, RAUDA ([id number]) ADDITIONAL  INFORMATION:(continued) POSITIVE HER2/CEP17 Ratio >=2.0 or <2.0 and average HER2 copy number >=6.0 Vicente Males MD Pathologist, Electronic Signature (  Signed 06/27/2017) 1. E-cadherin is positive consistent with a ductal phenotype. Vicente Males MD Pathologist, Electronic Signature ( Signed 06/26/2017) FINAL DIAGNOSIS Diagnosis 1. Breast, left, needle core biopsy, upper outer - INVASIVE MAMMARY CARCINOMA, SEE COMMENT. 2. Breast, right, needle core biopsy, 10:00 o'clock - FIBROCYSTIC CHANGE. - NO MALIGNANCY IDENTIFIED. Microscopic Comment 1. The carcinoma appears grade 1-2. E-cadherin will be ordered. Prognostic markers will be ordered. Dr. Lyndon Code has reviewed the case. The case was called to [facility] on 06/26/2017. Vicente Males MD Pathologist, Electronic Signature (Case signed 06/26/2017) Specimen Gross and Clinical Information Specimen Comment 1. TIF: 8:45 AM; extracted < 5 min; left breast mass 2. TIF: 9:15 AM; extracted < 30 sec; right breast distortion/mass Specimen(s) Obtained: 1. Breast, left, needle core biopsy, upper outer 2. Breast, right, needle core biopsy, 10:00 o'clock Specimen Clinical Information 1. C/F CSL vs radial scar vs IDC 2. C/F CSL vs radial scar vs IDC 2 of 3 FINAL for [name], GARMON [name] [phone]) Gross 1. Received labeled "Dondero,[place]" and "Lt breast upper outer" (TIF 0845 CIT <81mn) are multiple cores and irregular pieces of yellow to gray white soft tissue, aggregating 2.2 x 1.6 x 0.2 cm.One block submitted.(SSW 8/8) 2. Received labeled "Bazzi,Sadae" and "Right breast 10:00" (TIF 0915 CIT <30secs) are 4 cores of yellow to gray white soft tissue, ranging from 0.3 x 0.15 x 0.1 cm to 1 x 0.2 x 0.1 cm. One block submitted.(SSW 8/8) Stain(s) used in Diagnosis: The following stain(s) were used in diagnosing the case: Her2 FISH, PR-ACIS, KI-67-ACIS, ER-ACIS, E-CAD. The control(s) stained appropriately. Disclaimer Estrogen receptor (6F11),  immunohistochemical stains are performed on formalin fixed, paraffin embedded tissue using a 3,3"-diaminobenzidine (DAB) chromogen and Leica Bond Autostainer System. The staining intensity of the nucleus is scored manually and is reported as the percentage of tumor cell nuclei demonstrating specific nuclear staining.Specimens are fixed in 10% Neutral Buffered Formalin for at least 6 hours and up to 72 hours. These tests have not be validated on decalcified tissue. Results should be interpreted with caution given the possibility of false negative results on decalcified specimens. HER2 IQFISH pharmDX (code [id number] is a direct fluorescence in-situ hybridization assay designed to quantitatively determine HER2 gene amplification in formalin-fixed, paraffin-embedded tissue specimens. It is performed at [facility] is reported using ASCO/CAP scoring criteria published in 2013. Ki-67 (MM1), immunohistochemical stains are performed on formalin fixed, paraffin embedded tissue using a 3,3"-diaminobenzidine (DAB) chromogen and Leica Bond Autostainer System. The staining intensity of the nucleus is scored manually and is reported as the percentage of tumor cell nuclei demonstrating specific nuclear staining.Specimens are fixed in 10% Neutral Buffered Formalin for at least 6 hours and up to 72 hours. These tests have not be validated on decalcified tissue. Results should be interpreted with caution given the possibility of false negative results on decalcified specimens. PR progesterone receptor (16), immunohistochemical stains are performed on formalin fixed, paraffin embedded tissue using a 3,3"-diaminobenzidine (DAB) chromogen and Leica Bond Autostainer System. The staining intensity of the nucleus is scored manually and is reported as the percentage of tumor cell nuclei demonstrating specific nuclear staining.Specimens are fixed in 10% Neutral Buffered Formalin for at least 6 hours and up to 72 hours.  These tests have not be validated on decalcified tissue. Results should be interpreted with caution given the possibility of false negative results on decalcified specimens. Some of these immunohistochemical stains may have been developed and the performance characteristics determined by [facility] Some may not  have been cleared or approved by the U.S. Food and Drug Administration. The FDA has determined that such clearance or approval is not necessary. This test is used for clinical purposes. It should not be regarded as investigational or for research. This laboratory is certified under the [place] (CLIA-88) as qualified to perform high complexity clinical laboratory testing. Report signed out from the following location(s) Technical Component was performed at [facility]. [address],[place] [address].[id number]., Interpretation was performed at [facility] [address], [place], [place] [address]. CLIA #: [id number], 3 of 3.  The patient is a 81 year old female.   Past Surgical History (Tanisha A. Owens Shark, [place]; 07/02/2017 2:35 PM) Breast Biopsy Bilateral. Cataract Surgery Bilateral. Foot Surgery Right. Knee Surgery Right. Liver Surgery Spinal Surgery - Lower Back Spinal Surgery - Neck  Diagnostic Studies History (Tanisha A. Owens Shark, [place]; 07/02/2017 2:35 PM) Colonoscopy 1-5 years ago Mammogram within last year Pap Smear 1-5 years ago  Allergies (Tanisha A. Owens Shark, [place]; 07/02/2017 2:39 PM) Moxifloxacin HCl *CHEMICALS* Penicillin G Benzathine & Proc *PENICILLINS* Quinine HCl *CHEMICALS* Pramipexole Dihydrochloride *ANTIPARKINSON AND RELATED THERAPY AGENTS* ROPINIRole HCl *ANTIPARKINSON AND RELATED THERAPY AGENTS* Codeine Phosphate *ANALGESICS - OPIOID* Allergies Reconciled  Medication History (Tanisha A. Owens Shark, [place]; 07/02/2017 2:41 PM) Sertraline  HCl ('100MG'$  Tablet, Oral) Active. ALPRAZolam (0.'25MG'$  Tablet, Oral) Active. Gabapentin ('300MG'$  Capsule, Oral) Active. Pravastatin Sodium ('40MG'$  Tablet, Oral) Active. TraMADol HCl ('50MG'$  Tablet, Oral) Active. Aspirin ('81MG'$  Tablet, Oral) Active. Timolol Maleate (0.5% Solution, Ophthalmic) Active. AmLODIPine Besylate (2.'5MG'$  Tablet, Oral) Active. Medications Reconciled  Social History (Tanisha A. Owens Shark, [place]; 07/02/2017 2:35 PM) Caffeine use Carbonated beverages, Coffee. No alcohol use No drug use Tobacco use Never smoker.  Family History (Tanisha A. Owens Shark, [place]; 07/02/2017 2:35 PM) Breast Cancer Daughter. Prostate Cancer Father.  Pregnancy / Birth History (Tanisha A. Owens Shark, [place]; 07/02/2017 2:35 PM) Age at menarche 20 years. Age of menopause 62-50 Gravida 2 Maternal age 34-20 Para 2 Regular periods  Other Problems (Tanisha A. Owens Shark, [place]; 07/02/2017 2:35 PM) Anxiety Disorder Back Pain Hemorrhoids Lump In Breast     Review of Systems (Tanisha A. Brown RMA; 07/02/2017 2:35 PM) General Not Present- Appetite Loss, Chills, Fatigue, Fever, Night Sweats, Weight Gain and Weight Loss. Skin Not Present- Change in Wart/Mole, Dryness, Hives, Jaundice, New Lesions, Non-Healing Wounds, Rash and Ulcer. HEENT Present- Hearing Loss, Ringing in the Ears, Seasonal Allergies and Wears glasses/contact lenses. Not Present- Earache, Hoarseness, Nose Bleed, Oral Ulcers, Sinus Pain, Sore Throat, Visual Disturbances and Yellow Eyes. Respiratory Not Present- Bloody sputum, Chronic Cough, Difficulty Breathing, Snoring and Wheezing. Breast Present- Breast Mass. Not Present- Breast Pain, Nipple Discharge and Skin Changes. Cardiovascular Not Present- Chest Pain, Difficulty Breathing Lying Down, Leg Cramps, Palpitations, Rapid Heart Rate, Shortness of Breath and Swelling of Extremities. Gastrointestinal Not Present- Abdominal Pain, Bloating, Bloody Stool, Change in Bowel Habits, Chronic diarrhea,  Constipation, Difficulty Swallowing, Excessive gas, Gets full quickly at meals, Hemorrhoids, Indigestion, Nausea, Rectal Pain and Vomiting. Female Genitourinary Not Present- Frequency, Nocturia, Painful Urination, Pelvic Pain and Urgency. Musculoskeletal Present- Back Pain and Muscle Weakness. Not Present- Joint Pain, Joint Stiffness, Muscle Pain and Swelling of Extremities. Neurological Not Present- Decreased Memory, Fainting, Headaches, Numbness, Seizures, Tingling, Tremor, Trouble walking and Weakness. Psychiatric Present- Anxiety. Not Present- Bipolar, Change in Sleep Pattern, Depression, Fearful and Frequent crying. Hematology Present- Easy Bruising. Not Present- Blood Thinners, Excessive bleeding, Gland problems, HIV and Persistent Infections.  Vitals (Tanisha A. Owens Shark RMA;  07/02/2017 2:37 PM) 07/02/2017 2:36 PM Weight: 187.2 lb Height: 65in Body Surface Area: 1.92 m Body Mass Index: 31.15 kg/m  Temp.: 98.52F  Pulse: 63 (Regular)  P.OX: 92% (Room air) BP: 118/82 (Sitting, Left Arm, Standard)      Physical Exam (Shloima Clinch A. Seleste Tallman MD; 07/02/2017 3:18 PM)  General Mental Status-Alert. General Appearance-Consistent with stated age. Hydration-Well hydrated. Voice-Normal.  Head and Neck Head-normocephalic, atraumatic with no lesions or palpable masses. Trachea-midline. Thyroid Gland Characteristics - normal size and consistency.  Eye Eyeball - Bilateral-Extraocular movements intact. Sclera/Conjunctiva - Bilateral-No scleral icterus.  Chest and Lung Exam Chest and lung exam reveals -quiet, even and easy respiratory effort with no use of accessory muscles and on auscultation, normal breath sounds, no adventitious sounds and normal vocal resonance. Inspection Chest Wall - Normal. Back - normal.  Breast Note: bilateral breast hematomas small no other masses no nipple discharge  Cardiovascular Cardiovascular examination reveals -normal heart  sounds, regular rate and rhythm with no murmurs and normal pedal pulses bilaterally.  Neurologic Neurologic evaluation reveals -alert and oriented x 3 with no impairment of recent or remote memory. Mental Status-Normal.  Musculoskeletal Normal Exam - Left-Upper Extremity Strength Normal and Lower Extremity Strength Normal. Normal Exam - Right-Upper Extremity Strength Normal and Lower Extremity Strength Normal.  Lymphatic Head & Neck  General Head & Neck Lymphatics: Bilateral - Description - Normal. Axillary  General Axillary Region: Bilateral - Description - Normal. Tenderness - Non Tender.    Assessment & Plan (Krissy Orebaugh A. Labresha Mellor MD; 07/02/2017 3:19 PM)  BREAST CANCER, LEFT (C50.912) Impression: Discussed options with the patient. The patient would like to undergo left breast lumpectomy. Discussed the role of sentinel lymph node mapping as well. Discussed the role of mastectomy reconstruction. Pros and cons to all surgical interventions discussed as well as potential complications. Risk of lumpectomy include bleeding, infection, seroma, more surgery, use of seed/wire, wound care, cosmetic deformity and the need for other treatments, death , blood clots, death. Pt agrees to proceed. Risk of sentinel lymph node mapping include bleeding, infection, lymphedema, shoulder pain. stiffness, dye allergy. cosmetic deformity , blood clots, death, need for more surgery. Pt agres to proceed.  Current Plans You are being scheduled for surgery- Our schedulers will call you.  You should hear from our office's scheduling department within 5 working days about the location, date, and time of surgery. We try to make accommodations for patient's preferences in scheduling surgery, but sometimes the OR schedule or the surgeon's schedule prevents [place] from making those accommodations.  If you have not heard from our office [phone]) in 5 working days, call the office and ask for your surgeon's  nurse.  If you have other questions about your diagnosis, plan, or surgery, call the office and ask for your surgeon's nurse.  Pt Education - [facility] Breast Cancer Information Given - [facility] "Breast Journey" Package Pt Education - Pamphlet Given - Breast Biopsy: discussed with patient and provided information. We discussed the staging and pathophysiology of breast cancer. We discussed all of the different options for treatment for breast cancer including surgery, chemotherapy, radiation therapy, Herceptin, and antiestrogen therapy. We discussed a sentinel lymph node biopsy as she does not appear to having lymph node involvement right now. We discussed the performance of that with injection of radioactive tracer and blue dye. We discussed that she would have an incision underneath her axillary hairline. We discussed that there is a bout a 10-20% chance of having a positive node with a sentinel  lymph node biopsy and we will await the permanent pathology to make any other first further decisions in terms of her treatment. One of these options might be to return to the operating room to perform an axillary lymph node dissection. We discussed about a 1-2% risk lifetime of chronic shoulder pain as well as lymphedema associated with a sentinel lymph node biopsy. We discussed the options for treatment of the breast cancer which included lumpectomy versus a mastectomy. We discussed the performance of the lumpectomy with a wire placement. We discussed a 10-20% chance of a positive margin requiring reexcision in the operating room. We also discussed that she may need radiation therapy or antiestrogen therapy or both if she undergoes lumpectomy. We discussed the mastectomy and the postoperative care for that as well. We discussed that there is no difference in her survival whether she undergoes lumpectomy with radiation therapy or antiestrogen therapy versus a mastectomy. There is a slight difference in the local  recurrence rate being 3-5% with lumpectomy and about 1% with a mastectomy. We discussed the risks of operation including bleeding, infection, possible reoperation. She understands her further therapy will be based on what her stages at the time of her operation.  Pt Education - breast cancer surgery: discussed with patient and provided information. Pt Education - [facility] Breast Biopsy HCI: discussed with patient and provided information. Pt Education - ABC (After Breast Cancer) Class Info: discussed with patient and provided information.

## 2017-07-06 ENCOUNTER — Other Ambulatory Visit: Payer: Self-pay | Admitting: Internal Medicine

## 2017-07-07 ENCOUNTER — Encounter: Payer: Self-pay | Admitting: Radiation Oncology

## 2017-07-08 ENCOUNTER — Encounter: Payer: Self-pay | Admitting: Oncology

## 2017-07-08 ENCOUNTER — Telehealth: Payer: Self-pay | Admitting: Oncology

## 2017-07-08 NOTE — Telephone Encounter (Signed)
faxed

## 2017-07-08 NOTE — Telephone Encounter (Signed)
Appt has been scheduled for the pt to see Dr. Jana Hakim on 9/12 at 430pm. Left a vm with the appt date and time. Letter mailed to the pt.

## 2017-07-08 NOTE — Telephone Encounter (Signed)
Done hardcopy to Shirron  

## 2017-07-10 ENCOUNTER — Encounter (HOSPITAL_BASED_OUTPATIENT_CLINIC_OR_DEPARTMENT_OTHER): Payer: Self-pay | Admitting: *Deleted

## 2017-07-10 NOTE — Pre-Procedure Instructions (Signed)
To come for CBC, diff, CMET, EKG; to pick up Ensure 10 oz. pre-surgery drink, to drink by 0430 DOS.

## 2017-07-11 ENCOUNTER — Encounter (HOSPITAL_BASED_OUTPATIENT_CLINIC_OR_DEPARTMENT_OTHER): Payer: Self-pay | Admitting: *Deleted

## 2017-07-15 ENCOUNTER — Ambulatory Visit
Admission: RE | Admit: 2017-07-15 | Discharge: 2017-07-15 | Disposition: A | Discharge status: Home / Self Care | Payer: Medicare Other | Source: Ambulatory Visit | Attending: Surgery | Admitting: Surgery

## 2017-07-15 ENCOUNTER — Other Ambulatory Visit: Payer: Self-pay | Admitting: Surgery

## 2017-07-15 ENCOUNTER — Ambulatory Visit: Payer: Self-pay | Admitting: Surgery

## 2017-07-15 ENCOUNTER — Encounter (HOSPITAL_BASED_OUTPATIENT_CLINIC_OR_DEPARTMENT_OTHER)
Admission: RE | Admit: 2017-07-15 | Discharge: 2017-07-15 | Disposition: A | Discharge status: Home / Self Care | Payer: Medicare Other | Source: Ambulatory Visit | Attending: Surgery | Admitting: Surgery

## 2017-07-15 DIAGNOSIS — N6489 Other specified disorders of breast: Secondary | ICD-10-CM

## 2017-07-15 DIAGNOSIS — F419 Anxiety disorder, unspecified: Secondary | ICD-10-CM | POA: Diagnosis not present

## 2017-07-15 DIAGNOSIS — N6011 Diffuse cystic mastopathy of right breast: Secondary | ICD-10-CM | POA: Diagnosis not present

## 2017-07-15 DIAGNOSIS — F329 Major depressive disorder, single episode, unspecified: Secondary | ICD-10-CM | POA: Diagnosis not present

## 2017-07-15 DIAGNOSIS — Z170 Estrogen receptor positive status [ER+]: Principal | ICD-10-CM

## 2017-07-15 DIAGNOSIS — Z7982 Long term (current) use of aspirin: Secondary | ICD-10-CM | POA: Diagnosis not present

## 2017-07-15 DIAGNOSIS — C50412 Malignant neoplasm of upper-outer quadrant of left female breast: Secondary | ICD-10-CM

## 2017-07-15 DIAGNOSIS — C50911 Malignant neoplasm of unspecified site of right female breast: Secondary | ICD-10-CM | POA: Diagnosis not present

## 2017-07-15 DIAGNOSIS — Z86718 Personal history of other venous thrombosis and embolism: Secondary | ICD-10-CM | POA: Diagnosis not present

## 2017-07-15 DIAGNOSIS — Z880 Allergy status to penicillin: Secondary | ICD-10-CM | POA: Diagnosis not present

## 2017-07-15 DIAGNOSIS — Z885 Allergy status to narcotic agent status: Secondary | ICD-10-CM | POA: Diagnosis not present

## 2017-07-15 DIAGNOSIS — M1990 Unspecified osteoarthritis, unspecified site: Secondary | ICD-10-CM | POA: Diagnosis not present

## 2017-07-15 DIAGNOSIS — I10 Essential (primary) hypertension: Secondary | ICD-10-CM | POA: Diagnosis not present

## 2017-07-15 DIAGNOSIS — M6281 Muscle weakness (generalized): Secondary | ICD-10-CM | POA: Diagnosis not present

## 2017-07-15 DIAGNOSIS — K219 Gastro-esophageal reflux disease without esophagitis: Secondary | ICD-10-CM | POA: Diagnosis not present

## 2017-07-15 DIAGNOSIS — M549 Dorsalgia, unspecified: Secondary | ICD-10-CM | POA: Diagnosis not present

## 2017-07-15 DIAGNOSIS — Z79899 Other long term (current) drug therapy: Secondary | ICD-10-CM | POA: Diagnosis not present

## 2017-07-15 DIAGNOSIS — Z888 Allergy status to other drugs, medicaments and biological substances status: Secondary | ICD-10-CM | POA: Diagnosis not present

## 2017-07-15 DIAGNOSIS — Z881 Allergy status to other antibiotic agents status: Secondary | ICD-10-CM | POA: Diagnosis not present

## 2017-07-15 DIAGNOSIS — I739 Peripheral vascular disease, unspecified: Secondary | ICD-10-CM | POA: Diagnosis not present

## 2017-07-15 LAB — CBC WITH DIFFERENTIAL/PLATELET
Basophils Absolute: 0 10*3/uL (ref 0.0–0.1)
Basophils Relative: 0 %
Eosinophils Absolute: 0.2 10*3/uL (ref 0.0–0.7)
Eosinophils Relative: 2 %
HCT: 39.5 % (ref 36.0–46.0)
HEMOGLOBIN: 12.7 g/dL (ref 12.0–15.0)
LYMPHS ABS: 1.5 10*3/uL (ref 0.7–4.0)
LYMPHS PCT: 20 %
MCH: 30.6 pg (ref 26.0–34.0)
MCHC: 32.2 g/dL (ref 30.0–36.0)
MCV: 95.2 fL (ref 78.0–100.0)
Monocytes Absolute: 0.6 10*3/uL (ref 0.1–1.0)
Monocytes Relative: 8 %
NEUTROS PCT: 70 %
Neutro Abs: 5.2 10*3/uL (ref 1.7–7.7)
Platelets: 213 10*3/uL (ref 150–400)
RBC: 4.15 MIL/uL (ref 3.87–5.11)
RDW: 13.4 % (ref 11.5–15.5)
WBC: 7.4 10*3/uL (ref 4.0–10.5)

## 2017-07-15 LAB — COMPREHENSIVE METABOLIC PANEL
ALK PHOS: 59 U/L (ref 38–126)
ALT: 18 U/L (ref 14–54)
AST: 22 U/L (ref 15–41)
Albumin: 3.8 g/dL (ref 3.5–5.0)
Anion gap: 5 (ref 5–15)
BUN: 18 mg/dL (ref 6–20)
CALCIUM: 8.9 mg/dL (ref 8.9–10.3)
CO2: 29 mmol/L (ref 22–32)
CREATININE: 0.76 mg/dL (ref 0.44–1.00)
Chloride: 104 mmol/L (ref 101–111)
Glucose, Bld: 90 mg/dL (ref 65–99)
Potassium: 4.3 mmol/L (ref 3.5–5.1)
Sodium: 138 mmol/L (ref 135–145)
Total Bilirubin: 0.6 mg/dL (ref 0.3–1.2)
Total Protein: 7.5 g/dL (ref 6.5–8.1)

## 2017-07-15 NOTE — Anesthesia Preprocedure Evaluation (Addendum)
Anesthesia Evaluation  Patient identified by MRN, date of birth, ID band Patient awake    Reviewed: Allergy & Precautions, NPO status , Patient's Chart, lab work & pertinent test results  Airway Mallampati: II  TM Distance: >3 FB Neck ROM: Full    Dental  (+) Teeth Intact, Dental Advisory Given, Implants, Caps   Pulmonary neg pulmonary ROS,    Pulmonary exam normal breath sounds clear to auscultation       Cardiovascular hypertension, Pt. on medications (-) angina+ Peripheral Vascular Disease and + DVT  (-) CAD, (-) Past MI and (-) CHF Normal cardiovascular exam Rhythm:Regular Rate:Normal     Neuro/Psych PSYCHIATRIC DISORDERS Depression  Neuromuscular disease    GI/Hepatic Neg liver ROS, GERD  Medicated,  Endo/Other  negative endocrine ROS  Renal/GU negative Renal ROS     Musculoskeletal  (+) Arthritis , Osteoarthritis,    Abdominal   Peds  Hematology Plt 216k   Anesthesia Other Findings Day of surgery medications reviewed with the patient.  Reproductive/Obstetrics                             Anesthesia Physical  Anesthesia Plan  ASA: III  Anesthesia Plan: General   Post-op Pain Management:  Regional for Post-op pain   Induction: Intravenous  PONV Risk Score and Plan: 2 and Ondansetron, Dexamethasone and Treatment may vary due to age or medical condition  Airway Management Planned: Simple Face Mask  Additional Equipment:   Intra-op Plan:   Post-operative Plan:   Informed Consent: I have reviewed the patients History and Physical, chart, labs and discussed the procedure including the risks, benefits and alternatives for the proposed anesthesia with the patient or authorized representative who has indicated his/her understanding and acceptance.   Dental advisory given  Plan Discussed with: CRNA  Anesthesia Plan Comments: (Discussed risks and benefits of and differences  between spinal and general. Discussed risks of spinal including headache, backache, failure, bleeding, infection, and nerve damage. Patient consents to spinal. Questions answered. Coagulation studies and platelet count acceptable.  Discussed risks and benefits of adductor canal block including failure, bleeding, infection, nerve damage, weakness. Discussed that the block may not prevent all of the pain in the knee. Questions answered. Patient consents to block. )       Anesthesia Quick Evaluation

## 2017-07-15 NOTE — Progress Notes (Signed)
Labs and EKG done, Ensure pre-surgery drink given with instructions to complete by [place], hibiclens soap given with instructions, pt verbalized understanding.  Labs and EKG- WNL

## 2017-07-15 NOTE — Progress Notes (Signed)
Discrepancy in consent orders noted while preop planning. One states left breast with SLN, the other states right breast. Called office to verify. Spoke with Sunday Spillers (in office) who states procedure will be bilateral, that both consents are correct.

## 2017-07-16 ENCOUNTER — Encounter (HOSPITAL_COMMUNITY)
Admission: RE | Admit: 2017-07-16 | Discharge: 2017-07-16 | Disposition: A | Discharge status: Home / Self Care | Payer: Medicare Other | Source: Ambulatory Visit | Attending: Surgery | Admitting: Surgery

## 2017-07-16 ENCOUNTER — Ambulatory Visit (HOSPITAL_BASED_OUTPATIENT_CLINIC_OR_DEPARTMENT_OTHER): Payer: Medicare Other | Admitting: Anesthesiology

## 2017-07-16 ENCOUNTER — Ambulatory Visit
Admission: RE | Admit: 2017-07-16 | Discharge: 2017-07-16 | Disposition: A | Discharge status: Home / Self Care | Payer: Medicare Other | Source: Ambulatory Visit | Attending: Surgery | Admitting: Surgery

## 2017-07-16 ENCOUNTER — Encounter (HOSPITAL_BASED_OUTPATIENT_CLINIC_OR_DEPARTMENT_OTHER)
Admission: RE | Disposition: A | Discharge status: Home / Self Care | Payer: Self-pay | Source: Ambulatory Visit | Attending: Surgery

## 2017-07-16 ENCOUNTER — Ambulatory Visit (HOSPITAL_BASED_OUTPATIENT_CLINIC_OR_DEPARTMENT_OTHER)
Admission: RE | Admit: 2017-07-16 | Discharge: 2017-07-16 | Disposition: A | Discharge status: Home / Self Care | Payer: Medicare Other | Source: Ambulatory Visit | Attending: Surgery | Admitting: Surgery

## 2017-07-16 ENCOUNTER — Encounter (HOSPITAL_BASED_OUTPATIENT_CLINIC_OR_DEPARTMENT_OTHER): Payer: Self-pay | Admitting: Anesthesiology

## 2017-07-16 DIAGNOSIS — C50412 Malignant neoplasm of upper-outer quadrant of left female breast: Secondary | ICD-10-CM

## 2017-07-16 DIAGNOSIS — Z170 Estrogen receptor positive status [ER+]: Secondary | ICD-10-CM | POA: Insufficient documentation

## 2017-07-16 DIAGNOSIS — M1990 Unspecified osteoarthritis, unspecified site: Secondary | ICD-10-CM | POA: Insufficient documentation

## 2017-07-16 DIAGNOSIS — M549 Dorsalgia, unspecified: Secondary | ICD-10-CM | POA: Insufficient documentation

## 2017-07-16 DIAGNOSIS — Z888 Allergy status to other drugs, medicaments and biological substances status: Secondary | ICD-10-CM | POA: Insufficient documentation

## 2017-07-16 DIAGNOSIS — M6281 Muscle weakness (generalized): Secondary | ICD-10-CM | POA: Insufficient documentation

## 2017-07-16 DIAGNOSIS — K219 Gastro-esophageal reflux disease without esophagitis: Secondary | ICD-10-CM | POA: Insufficient documentation

## 2017-07-16 DIAGNOSIS — C50911 Malignant neoplasm of unspecified site of right female breast: Secondary | ICD-10-CM | POA: Diagnosis not present

## 2017-07-16 DIAGNOSIS — N6489 Other specified disorders of breast: Secondary | ICD-10-CM

## 2017-07-16 DIAGNOSIS — Z79899 Other long term (current) drug therapy: Secondary | ICD-10-CM | POA: Insufficient documentation

## 2017-07-16 DIAGNOSIS — N6011 Diffuse cystic mastopathy of right breast: Secondary | ICD-10-CM | POA: Diagnosis not present

## 2017-07-16 DIAGNOSIS — F419 Anxiety disorder, unspecified: Secondary | ICD-10-CM | POA: Insufficient documentation

## 2017-07-16 DIAGNOSIS — F329 Major depressive disorder, single episode, unspecified: Secondary | ICD-10-CM | POA: Insufficient documentation

## 2017-07-16 DIAGNOSIS — I739 Peripheral vascular disease, unspecified: Secondary | ICD-10-CM | POA: Insufficient documentation

## 2017-07-16 DIAGNOSIS — Z86718 Personal history of other venous thrombosis and embolism: Secondary | ICD-10-CM | POA: Insufficient documentation

## 2017-07-16 DIAGNOSIS — Z880 Allergy status to penicillin: Secondary | ICD-10-CM | POA: Insufficient documentation

## 2017-07-16 DIAGNOSIS — Z881 Allergy status to other antibiotic agents status: Secondary | ICD-10-CM | POA: Insufficient documentation

## 2017-07-16 DIAGNOSIS — Z7982 Long term (current) use of aspirin: Secondary | ICD-10-CM | POA: Insufficient documentation

## 2017-07-16 DIAGNOSIS — I10 Essential (primary) hypertension: Secondary | ICD-10-CM | POA: Insufficient documentation

## 2017-07-16 DIAGNOSIS — Z885 Allergy status to narcotic agent status: Secondary | ICD-10-CM | POA: Insufficient documentation

## 2017-07-16 HISTORY — DX: Pure hypercholesterolemia, unspecified: E78.00

## 2017-07-16 HISTORY — DX: Restless legs syndrome: G25.81

## 2017-07-16 HISTORY — DX: Other seasonal allergic rhinitis: J30.2

## 2017-07-16 HISTORY — DX: Unspecified osteoarthritis, unspecified site: M19.90

## 2017-07-16 HISTORY — PX: BREAST LUMPECTOMY WITH RADIOACTIVE SEED LOCALIZATION: SHX6424

## 2017-07-16 HISTORY — PX: BREAST LUMPECTOMY WITH RADIOACTIVE SEED AND SENTINEL LYMPH NODE BIOPSY: SHX6550

## 2017-07-16 HISTORY — DX: Major depressive disorder, single episode, unspecified: F32.9

## 2017-07-16 HISTORY — DX: Depression, unspecified: F32.A

## 2017-07-16 HISTORY — DX: Unspecified hearing loss, unspecified ear: H91.90

## 2017-07-16 HISTORY — DX: Urinary tract infection, site not specified: N39.0

## 2017-07-16 HISTORY — DX: Reserved for inherently not codable concepts without codable children: IMO0001

## 2017-07-16 HISTORY — DX: Malignant neoplasm of unspecified site of left female breast: C50.912

## 2017-07-16 SURGERY — BREAST LUMPECTOMY WITH RADIOACTIVE SEED AND SENTINEL LYMPH NODE BIOPSY
Anesthesia: General | Site: Breast | Laterality: Right

## 2017-07-16 MED ORDER — ONDANSETRON HCL 4 MG/2ML IJ SOLN
INTRAMUSCULAR | Status: AC
  Filled 2017-07-16: qty 2

## 2017-07-16 MED ORDER — CEFAZOLIN SODIUM-DEXTROSE 1-4 GM/50ML-% IV SOLN
INTRAVENOUS | Status: DC | PRN
  Administered 2017-07-16: 2 g via INTRAVENOUS

## 2017-07-16 MED ORDER — CHLORHEXIDINE GLUCONATE CLOTH 2 % EX PADS: 6.0000 | MEDICATED_PAD | Freq: Once | CUTANEOUS | Status: DC

## 2017-07-16 MED ORDER — FENTANYL CITRATE (PF) 100 MCG/2ML IJ SOLN
INTRAMUSCULAR | Status: AC
  Filled 2017-07-16: qty 2

## 2017-07-16 MED ORDER — FENTANYL CITRATE (PF) 100 MCG/2ML IJ SOLN
25.0000 ug | INTRAMUSCULAR | Status: DC | PRN
Start: 2017-07-16 — End: 2017-07-16

## 2017-07-16 MED ORDER — LIDOCAINE 2% (20 MG/ML) 5 ML SYRINGE
INTRAMUSCULAR | Status: AC
  Filled 2017-07-16: qty 5

## 2017-07-16 MED ORDER — LACTATED RINGERS IV SOLN: INTRAVENOUS | Status: DC

## 2017-07-16 MED ORDER — LACTATED RINGERS IV SOLN
INTRAVENOUS | Status: DC | PRN
  Administered 2017-07-16 (×2): via INTRAVENOUS

## 2017-07-16 MED ORDER — LIDOCAINE HCL (PF) 1 % IJ SOLN
INTRAMUSCULAR | Status: AC
  Filled 2017-07-16: qty 30

## 2017-07-16 MED ORDER — LIDOCAINE HCL (CARDIAC) 20 MG/ML IV SOLN
INTRAVENOUS | Status: DC | PRN
  Administered 2017-07-16: 30 mg via INTRAVENOUS

## 2017-07-16 MED ORDER — METHYLENE BLUE 0.5 % INJ SOLN
INTRAVENOUS | Status: AC
  Filled 2017-07-16: qty 10

## 2017-07-16 MED ORDER — PROPOFOL 10 MG/ML IV BOLUS
INTRAVENOUS | Status: DC | PRN
  Administered 2017-07-16: 100 mg via INTRAVENOUS

## 2017-07-16 MED ORDER — DEXAMETHASONE SODIUM PHOSPHATE 4 MG/ML IJ SOLN
INTRAMUSCULAR | Status: DC | PRN
  Administered 2017-07-16: 10 mg via INTRAVENOUS

## 2017-07-16 MED ORDER — GLYCOPYRROLATE 0.2 MG/ML IJ SOLN
INTRAMUSCULAR | Status: DC | PRN
  Administered 2017-07-16: 0.1 mg via INTRAVENOUS

## 2017-07-16 MED ORDER — TECHNETIUM TC 99M SULFUR COLLOID FILTERED: 1.0000 | Freq: Once | INTRAVENOUS | Status: DC | PRN

## 2017-07-16 MED ORDER — MEPERIDINE HCL 25 MG/ML IJ SOLN: 6.2500 mg | INTRAMUSCULAR | Status: DC | PRN

## 2017-07-16 MED ORDER — ROPIVACAINE HCL 5 MG/ML IJ SOLN
INTRAMUSCULAR | Status: DC | PRN
  Administered 2017-07-16: 25 mL via PERINEURAL

## 2017-07-16 MED ORDER — DEXAMETHASONE SODIUM PHOSPHATE 10 MG/ML IJ SOLN
INTRAMUSCULAR | Status: AC
  Filled 2017-07-16: qty 1

## 2017-07-16 MED ORDER — FENTANYL CITRATE (PF) 100 MCG/2ML IJ SOLN
50.0000 ug | INTRAMUSCULAR | Status: DC | PRN
  Administered 2017-07-16: 50 ug via INTRAVENOUS

## 2017-07-16 MED ORDER — SCOPOLAMINE 1 MG/3DAYS TD PT72: 1.0000 | MEDICATED_PATCH | Freq: Once | TRANSDERMAL | Status: DC | PRN

## 2017-07-16 MED ORDER — PROPOFOL 10 MG/ML IV BOLUS
INTRAVENOUS | Status: AC
  Filled 2017-07-16: qty 20

## 2017-07-16 MED ORDER — CHLORHEXIDINE GLUCONATE CLOTH 2 % EX PADS
6.0000 | MEDICATED_PAD | Freq: Once | CUTANEOUS | Status: DC
Start: 2017-07-16 — End: 2017-07-16

## 2017-07-16 MED ORDER — CEFAZOLIN SODIUM-DEXTROSE 2-4 GM/100ML-% IV SOLN
INTRAVENOUS | Status: AC
  Filled 2017-07-16: qty 100

## 2017-07-16 MED ORDER — TRAMADOL HCL 50 MG PO TABS: 50.0000 mg | ORAL_TABLET | Freq: Four times a day (QID) | ORAL | 0 refills | Status: AC | PRN

## 2017-07-16 MED ORDER — SODIUM CHLORIDE 0.9 % IJ SOLN
INTRAMUSCULAR | Status: AC
  Filled 2017-07-16: qty 10

## 2017-07-16 MED ORDER — NAPROXEN 375 MG PO TABS
375.0000 mg | ORAL_TABLET | Freq: Two times a day (BID) | ORAL | 2 refills | Status: AC
Start: 2017-07-16 — End: 2018-07-16

## 2017-07-16 MED ORDER — TRAMADOL HCL 50 MG PO TABS
50.0000 mg | ORAL_TABLET | Freq: Once | ORAL | Status: AC | PRN
  Administered 2017-07-16: 50 mg via ORAL

## 2017-07-16 MED ORDER — BUPIVACAINE-EPINEPHRINE (PF) 0.25% -1:200000 IJ SOLN
INTRAMUSCULAR | Status: DC | PRN
  Administered 2017-07-16: 20 mL

## 2017-07-16 MED ORDER — TRAMADOL HCL 50 MG PO TABS
ORAL_TABLET | ORAL | Status: AC
  Filled 2017-07-16: qty 1

## 2017-07-16 MED ORDER — DEXTROSE 5 % IV SOLN: 3.0000 g | INTRAVENOUS | Status: DC

## 2017-07-16 MED ORDER — BUPIVACAINE-EPINEPHRINE (PF) 0.25% -1:200000 IJ SOLN
INTRAMUSCULAR | Status: AC
  Filled 2017-07-16: qty 120

## 2017-07-16 MED ORDER — SODIUM CHLORIDE 0.9 % IJ SOLN
INTRAVENOUS | Status: DC | PRN
  Administered 2017-07-16: 5 mL via INTRAMUSCULAR

## 2017-07-16 MED ORDER — MIDAZOLAM HCL 2 MG/2ML IJ SOLN
INTRAMUSCULAR | Status: AC
  Filled 2017-07-16: qty 2

## 2017-07-16 MED ORDER — ONDANSETRON HCL 4 MG/2ML IJ SOLN
INTRAMUSCULAR | Status: DC | PRN
  Administered 2017-07-16: 4 mg via INTRAVENOUS

## 2017-07-16 MED ORDER — MIDAZOLAM HCL 2 MG/2ML IJ SOLN
1.0000 mg | INTRAMUSCULAR | Status: DC | PRN
  Administered 2017-07-16: 1 mg via INTRAVENOUS

## 2017-07-16 SURGICAL SUPPLY — 53 items
ADH SKN CLS APL DERMABOND .7 (GAUZE/BANDAGES/DRESSINGS) ×2
APPLIER CLIP 9.375 MED OPEN (MISCELLANEOUS) ×4
APR CLP MED 9.3 20 MLT OPN (MISCELLANEOUS) ×2
BINDER BREAST LRG (GAUZE/BANDAGES/DRESSINGS) ×2 IMPLANT
BINDER BREAST XLRG (GAUZE/BANDAGES/DRESSINGS) IMPLANT
BLADE SURG 15 STRL LF DISP TIS (BLADE) ×2 IMPLANT
BLADE SURG 15 STRL SS (BLADE) ×8
CANISTER SUC SOCK COL 7IN (MISCELLANEOUS) IMPLANT
CANISTER SUCT 1200ML W/VALVE (MISCELLANEOUS) ×4 IMPLANT
CHLORAPREP W/TINT 26ML (MISCELLANEOUS) ×4 IMPLANT
CLIP APPLIE 9.375 MED OPEN (MISCELLANEOUS) ×2 IMPLANT
COVER BACK TABLE 60X90IN (DRAPES) ×4 IMPLANT
COVER MAYO STAND STRL (DRAPES) ×4 IMPLANT
COVER PROBE W GEL 5X96 (DRAPES) ×4 IMPLANT
DECANTER SPIKE VIAL GLASS SM (MISCELLANEOUS) IMPLANT
DERMABOND ADVANCED (GAUZE/BANDAGES/DRESSINGS) ×2
DERMABOND ADVANCED .7 DNX12 (GAUZE/BANDAGES/DRESSINGS) ×2 IMPLANT
DEVICE DUBIN W/COMP PLATE 8390 (MISCELLANEOUS) ×6 IMPLANT
DRAPE LAPAROSCOPIC ABDOMINAL (DRAPES) ×4 IMPLANT
DRAPE LAPAROTOMY 100X72 PEDS (DRAPES) ×4 IMPLANT
DRAPE UTILITY XL STRL (DRAPES) ×4 IMPLANT
ELECT COATED BLADE 2.86 ST (ELECTRODE) ×4 IMPLANT
ELECT REM PT RETURN 9FT ADLT (ELECTROSURGICAL) ×4
ELECTRODE REM PT RTRN 9FT ADLT (ELECTROSURGICAL) ×2 IMPLANT
GLOVE BIOGEL PI IND STRL 7.0 (GLOVE) IMPLANT
GLOVE BIOGEL PI IND STRL 8 (GLOVE) ×2 IMPLANT
GLOVE BIOGEL PI INDICATOR 7.0 (GLOVE) ×4
GLOVE BIOGEL PI INDICATOR 8 (GLOVE) ×2
GLOVE ECLIPSE 6.5 STRL STRAW (GLOVE) ×2 IMPLANT
GLOVE ECLIPSE 8.0 STRL XLNG CF (GLOVE) ×6 IMPLANT
GOWN STRL REUS W/ TWL LRG LVL3 (GOWN DISPOSABLE) ×4 IMPLANT
GOWN STRL REUS W/TWL LRG LVL3 (GOWN DISPOSABLE) ×8
HEMOSTAT ARISTA ABSORB 3G PWDR (MISCELLANEOUS) IMPLANT
HEMOSTAT SNOW SURGICEL 2X4 (HEMOSTASIS) ×2 IMPLANT
KIT MARKER MARGIN INK (KITS) ×4 IMPLANT
NDL HYPO 25X1 1.5 SAFETY (NEEDLE) ×2 IMPLANT
NDL SAFETY ECLIPSE 18X1.5 (NEEDLE) IMPLANT
NEEDLE HYPO 18GX1.5 SHARP (NEEDLE) ×4
NEEDLE HYPO 25X1 1.5 SAFETY (NEEDLE) ×8 IMPLANT
NS IRRIG 1000ML POUR BTL (IV SOLUTION) ×4 IMPLANT
PACK BASIN DAY SURGERY FS (CUSTOM PROCEDURE TRAY) ×4 IMPLANT
PENCIL BUTTON HOLSTER BLD 10FT (ELECTRODE) ×4 IMPLANT
SLEEVE SCD COMPRESS KNEE MED (MISCELLANEOUS) ×4 IMPLANT
SPONGE LAP 4X18 X RAY DECT (DISPOSABLE) ×6 IMPLANT
SUT MNCRL AB 4-0 PS2 18 (SUTURE) ×6 IMPLANT
SUT SILK 2 0 SH (SUTURE) IMPLANT
SUT VICRYL 3-0 CR8 SH (SUTURE) ×6 IMPLANT
SYR CONTROL 10ML LL (SYRINGE) ×6 IMPLANT
TOWEL OR 17X24 6PK STRL BLUE (TOWEL DISPOSABLE) ×4 IMPLANT
TOWEL OR NON WOVEN STRL DISP B (DISPOSABLE) ×4 IMPLANT
TUBE CONNECTING 20'X1/4 (TUBING) ×1
TUBE CONNECTING 20X1/4 (TUBING) ×3 IMPLANT
YANKAUER SUCT BULB TIP NO VENT (SUCTIONS) ×4 IMPLANT

## 2017-07-16 NOTE — H&P (View-Only) (Signed)
[name] 07/02/2017 2:35 PM Location: [facility] Patient #: [id number] DOB: 1936-05-13 Widowed / Language: [name] / Race: White Female  History of Present Illness [name] A. Sydney Bracken MD; 07/02/2017 3:18 PM) Patient words: Patient sent at the request of Dr. Rosana Hoes for mammographic abnormality detected during screening mammogram. The patient had a subcentimeter left breast mass in the upper quadrant. This was core biopsied and found to be consistent with invasive ductal carcinoma ER positive. Positive HER-2/neu negative. Size was about 1 cm in maximal diameter. Patient denies any history of breast pain nipple discharge or problem with breast issues. Right breast biopsy showed fibrocystic disease                        CLINICAL DATA: Patient recalled from screening for left breast mass and right breast asymmetry.  EXAM: 2D DIGITAL DIAGNOSTIC BILATERAL MAMMOGRAM WITH CAD AND ADJUNCT TOMO  ULTRASOUND BILATERAL BREAST  COMPARISON: Previous exam(s).  ACR Breast Density Category c: The breast tissue is heterogeneously dense, which may obscure small masses.  FINDINGS: Excisional biopsy changes are demonstrated within the lateral left breast. Within the more central aspect of the left breast, slightly laterally there is a 14 mm irregular mass with associated architectural distortion, further evaluated with spot compression CC and MLO tomosynthesis images.  Within the upper-outer right breast posterior depth there is a small focal area of architectural distortion.  Mammographic images were processed with CAD.  On physical exam, I palpate no discrete mass with central left or upper-outer right breast.  Targeted ultrasound is performed, showing shadowing within the periareolar left breast which may be secondary to postsurgical scarring. Defined mass is not able to be distinguished within this area given postsurgical changes. No left axillary  adenopathy.  Within the right breast 10 o'clock position 7 cm from nipple there is a 5 x 4 x 5 mm irregular hypoechoic mass with posterior acoustic shadowing. No right axillary lymphadenopathy.  IMPRESSION: Suspicious irregular mass within the central to slightly lateral left breast without definable sonographic correlate.  Suspicious distortion upper outer right breast with sonographic correlate.  RECOMMENDATION: Stereotactic guided core needle biopsy suspicious left breast mass with associated distortion.  Ultrasound-guided core needle biopsy focal distortion with irregular mass upper outer right breast, 10 o'clock position 7 cm from the nipple.  I have discussed the findings and recommendations with the patient. Results were also provided in writing at the conclusion of the visit. If applicable, a reminder letter will be sent to the patient regarding the next appointment.  BI-RADS CATEGORY 4: Suspicious.   Electronically Signed By: Lovey Newcomer M.D. On: 06/23/2017 11:43             ADDITIONAL INFORMATION: 1. PROGNOSTIC INDICATORS Results: IMMUNOHISTOCHEMICAL AND MORPHOMETRIC ANALYSIS PERFORMED MANUALLY Estrogen Receptor: 100%, POSITIVE, STRONG STAINING INTENSITY Progesterone Receptor: 100%, POSITIVE, STRONG STAINING INTENSITY Proliferation Marker Ki67: 5% REFERENCE RANGE ESTROGEN RECEPTOR NEGATIVE 0% POSITIVE =>1% REFERENCE RANGE PROGESTERONE RECEPTOR NEGATIVE 0% POSITIVE =>1% All controls stained appropriately Sydney Laws MD Pathologist, Electronic Signature ( Signed 07/01/2017) 1. FLUORESCENCE IN-SITU HYBRIDIZATION Results: HER2 - NEGATIVE RATIO OF HER2/CEP17 SIGNALS 1.29 AVERAGE HER2 COPY NUMBER PER CELL 1.80 Reference Range: NEGATIVE HER2/CEP17 Ratio <2.0 and average HER2 copy number <4.0 EQUIVOCAL HER2/CEP17 Ratio <2.0 and average HER2 copy number >=4.0 and <6.0 1 of 3 FINAL for [name] ([id number]) ADDITIONAL  INFORMATION:(continued) POSITIVE HER2/CEP17 Ratio >=2.0 or <2.0 and average HER2 copy number >=6.0 Sydney Males MD Pathologist, Electronic Signature (  Signed 06/27/2017) 1. E-cadherin is positive consistent with a ductal phenotype. Sydney Hoar MD Pathologist, Electronic Signature ( Signed 06/26/2017) FINAL DIAGNOSIS Diagnosis 1. Breast, left, needle core biopsy, upper outer - INVASIVE MAMMARY CARCINOMA, SEE COMMENT. 2. Breast, right, needle core biopsy, 10:00 o'clock - FIBROCYSTIC CHANGE. - NO MALIGNANCY IDENTIFIED. Microscopic Comment 1. The carcinoma appears grade 1-2. E-cadherin will be ordered. Prognostic markers will be ordered. Dr. Colonel [name] has reviewed the case. The case was called to [facility] on 06/26/2017. Sydney Hoar MD Pathologist, Electronic Signature (Case signed 06/26/2017) Specimen Gross and Clinical Information Specimen Comment 1. TIF: 8:45 AM; extracted < 5 min; left breast mass 2. TIF: 9:15 AM; extracted < 30 sec; right breast distortion/mass Specimen(s) Obtained: 1. Breast, left, needle core biopsy, upper outer 2. Breast, right, needle core biopsy, 10:00 o'clock Specimen Clinical Information 1. C/F CSL vs radial scar vs IDC 2. C/F CSL vs radial scar vs IDC 2 of 3 FINAL for [name] D [phone]) Gross 1. Received labeled "[name]" and "Lt breast upper outer" (TIF 0845 CIT <84min) are multiple cores and irregular pieces of yellow to gray white soft tissue, aggregating 2.2 x 1.6 x 0.2 cm.One block submitted.(SSW 8/8) 2. Received labeled "[name]" and "Right breast 10:00" (TIF 0915 CIT <30secs) are 4 cores of yellow to gray white soft tissue, ranging from 0.3 x 0.15 x 0.1 cm to 1 x 0.2 x 0.1 cm. One block submitted.(SSW 8/8) Stain(s) used in Diagnosis: The following stain(s) were used in diagnosing the case: Her2 FISH, PR-ACIS, KI-67-ACIS, ER-ACIS, E-CAD. The control(s) stained appropriately. Disclaimer Estrogen receptor (6F11),  immunohistochemical stains are performed on formalin fixed, paraffin embedded tissue using a 3,3"-diaminobenzidine (DAB) chromogen and Leica Bond Autostainer System. The staining intensity of the nucleus is scored manually and is reported as the percentage of tumor cell nuclei demonstrating specific nuclear staining.Specimens are fixed in 10% Neutral Buffered Formalin for at least 6 hours and up to 72 hours. These tests have not be validated on decalcified tissue. Results should be interpreted with caution given the possibility of false negative results on decalcified specimens. HER2 IQFISH pharmDX (code [phone]) is a direct fluorescence in-situ hybridization assay designed to quantitatively determine HER2 gene amplification in formalin-fixed, paraffin-embedded tissue specimens. It is performed at [facility] and is reported using ASCO/CAP scoring criteria published in 2013. Ki-67 (MM1), immunohistochemical stains are performed on formalin fixed, paraffin embedded tissue using a 3,3"-diaminobenzidine (DAB) chromogen and Leica Bond Autostainer System. The staining intensity of the nucleus is scored manually and is reported as the percentage of tumor cell nuclei demonstrating specific nuclear staining.Specimens are fixed in 10% Neutral Buffered Formalin for at least 6 hours and up to 72 hours. These tests have not be validated on decalcified tissue. Results should be interpreted with caution given the possibility of false negative results on decalcified specimens. PR progesterone receptor (16), immunohistochemical stains are performed on formalin fixed, paraffin embedded tissue using a 3,3"-diaminobenzidine (DAB) chromogen and Leica Bond Autostainer System. The staining intensity of the nucleus is scored manually and is reported as the percentage of tumor cell nuclei demonstrating specific nuclear staining.Specimens are fixed in 10% Neutral Buffered Formalin for at least 6 hours and up to 72 hours.  These tests have not be validated on decalcified tissue. Results should be interpreted with caution given the possibility of false negative results on decalcified specimens. Some of these immunohistochemical stains may have been developed and the performance characteristics determined by [facility]. Some may not  have been cleared or approved by the U.S. Food and Drug Administration. The FDA has determined that such clearance or approval is not necessary. This test is used for clinical purposes. It should not be regarded as investigational or for research. This laboratory is certified under the [place] (CLIA-88) as qualified to perform high complexity clinical laboratory testing. Report signed out from the following location(s) Technical Component was performed at [facility] [address],[place] [address].[id number]., Interpretation was performed at [facility], [place], [place] [address]. CLIA #: [id number], 3 of 3.  The patient is a 81 year old female.   Past Surgical History (Tanisha A. Owens Shark, [place]; 07/02/2017 2:35 PM) Breast Biopsy Bilateral. Cataract Surgery Bilateral. Foot Surgery Right. Knee Surgery Right. Liver Surgery Spinal Surgery - Lower Back Spinal Surgery - Neck  Diagnostic Studies History (Tanisha A. Owens Shark, [place]; 07/02/2017 2:35 PM) Colonoscopy 1-5 years ago Mammogram within last year Pap Smear 1-5 years ago  Allergies (Tanisha A. Owens Shark, [place]; 07/02/2017 2:39 PM) Moxifloxacin HCl *CHEMICALS* Penicillin G Benzathine & Proc *PENICILLINS* Quinine HCl *CHEMICALS* Pramipexole Dihydrochloride *ANTIPARKINSON AND RELATED THERAPY AGENTS* ROPINIRole HCl *ANTIPARKINSON AND RELATED THERAPY AGENTS* Codeine Phosphate *ANALGESICS - OPIOID* Allergies Reconciled  Medication History (Tanisha A. Owens Shark, [place]; 07/02/2017 2:41 PM) Sertraline  HCl ('100MG'$  Tablet, Oral) Active. ALPRAZolam (0.'25MG'$  Tablet, Oral) Active. Gabapentin ('300MG'$  Capsule, Oral) Active. Pravastatin Sodium ('40MG'$  Tablet, Oral) Active. TraMADol HCl ('50MG'$  Tablet, Oral) Active. Aspirin ('81MG'$  Tablet, Oral) Active. Timolol Maleate (0.5% Solution, Ophthalmic) Active. AmLODIPine Besylate (2.'5MG'$  Tablet, Oral) Active. Medications Reconciled  Social History (Tanisha A. Owens Shark, [place]; 07/02/2017 2:35 PM) Caffeine use Carbonated beverages, Coffee. No alcohol use No drug use Tobacco use Never smoker.  Family History (Tanisha A. Owens Shark, [place]; 07/02/2017 2:35 PM) Breast Cancer Daughter. Prostate Cancer Father.  Pregnancy / Birth History (Tanisha A. Owens Shark, [place]; 07/02/2017 2:35 PM) Age at menarche 87 years. Age of menopause 48-50 Gravida 2 Maternal age 39-20 Para 2 Regular periods  Other Problems (Tanisha A. Owens Shark, [place]; 07/02/2017 2:35 PM) Anxiety Disorder Back Pain Hemorrhoids Lump In Breast     Review of Systems (Tanisha A. Brown RMA; 07/02/2017 2:35 PM) General Not Present- Appetite Loss, Chills, Fatigue, Fever, Night Sweats, Weight Gain and Weight Loss. Skin Not Present- Change in Wart/Mole, Dryness, Hives, Jaundice, New Lesions, Non-Healing Wounds, Rash and Ulcer. HEENT Present- Hearing Loss, Ringing in the Ears, Seasonal Allergies and Wears glasses/contact lenses. Not Present- Earache, Hoarseness, Nose Bleed, Oral Ulcers, Sinus Pain, Sore Throat, Visual Disturbances and Yellow Eyes. Respiratory Not Present- Bloody sputum, Chronic Cough, Difficulty Breathing, Snoring and Wheezing. Breast Present- Breast Mass. Not Present- Breast Pain, Nipple Discharge and Skin Changes. Cardiovascular Not Present- Chest Pain, Difficulty Breathing Lying Down, Leg Cramps, Palpitations, Rapid Heart Rate, Shortness of Breath and Swelling of Extremities. Gastrointestinal Not Present- Abdominal Pain, Bloating, Bloody Stool, Change in Bowel Habits, Chronic diarrhea,  Constipation, Difficulty Swallowing, Excessive gas, Gets full quickly at meals, Hemorrhoids, Indigestion, Nausea, Rectal Pain and Vomiting. Female Genitourinary Not Present- Frequency, Nocturia, Painful Urination, Pelvic Pain and Urgency. Musculoskeletal Present- Back Pain and Muscle Weakness. Not Present- Joint Pain, Joint Stiffness, Muscle Pain and Swelling of Extremities. Neurological Not Present- Decreased Memory, Fainting, Headaches, Numbness, Seizures, Tingling, Tremor, Trouble walking and Weakness. Psychiatric Present- Anxiety. Not Present- Bipolar, Change in Sleep Pattern, Depression, Fearful and Frequent crying. Hematology Present- Easy Bruising. Not Present- Blood Thinners, Excessive bleeding, Gland problems, HIV and Persistent Infections.  Vitals (Tanisha A. Owens Shark RMA;  07/02/2017 2:37 PM) 07/02/2017 2:36 PM Weight: 187.2 lb Height: 65in Body Surface Area: 1.92 m Body Mass Index: 31.15 kg/m  Temp.: 98.76F  Pulse: 63 (Regular)  P.OX: 92% (Room air) BP: 118/82 (Sitting, Left Arm, Standard)      Physical Exam (Shermaine Brigham A. Aireanna Luellen MD; 07/02/2017 3:18 PM)  General Mental Status-Alert. General Appearance-Consistent with stated age. Hydration-Well hydrated. Voice-Normal.  Head and Neck Head-normocephalic, atraumatic with no lesions or palpable masses. Trachea-midline. Thyroid Gland Characteristics - normal size and consistency.  Eye Eyeball - Bilateral-Extraocular movements intact. Sclera/Conjunctiva - Bilateral-No scleral icterus.  Chest and Lung Exam Chest and lung exam reveals -quiet, even and easy respiratory effort with no use of accessory muscles and on auscultation, normal breath sounds, no adventitious sounds and normal vocal resonance. Inspection Chest Wall - Normal. Back - normal.  Breast Note: bilateral breast hematomas small no other masses no nipple discharge  Cardiovascular Cardiovascular examination reveals -normal heart  sounds, regular rate and rhythm with no murmurs and normal pedal pulses bilaterally.  Neurologic Neurologic evaluation reveals -alert and oriented x 3 with no impairment of recent or remote memory. Mental Status-Normal.  Musculoskeletal Normal Exam - Left-Upper Extremity Strength Normal and Lower Extremity Strength Normal. Normal Exam - Right-Upper Extremity Strength Normal and Lower Extremity Strength Normal.  Lymphatic Head & Neck  General Head & Neck Lymphatics: Bilateral - Description - Normal. Axillary  General Axillary Region: Bilateral - Description - Normal. Tenderness - Non Tender.    Assessment & Plan (Marquarius Lofton A. Jassica Zazueta MD; 07/02/2017 3:19 PM)  BREAST CANCER, LEFT (C50.912) Impression: Discussed options with the patient. The patient would like to undergo left breast lumpectomy. Discussed the role of sentinel lymph node mapping as well. Discussed the role of mastectomy reconstruction. Pros and cons to all surgical interventions discussed as well as potential complications. Risk of lumpectomy include bleeding, infection, seroma, more surgery, use of seed/wire, wound care, cosmetic deformity and the need for other treatments, death , blood clots, death. Pt agrees to proceed. Risk of sentinel lymph node mapping include bleeding, infection, lymphedema, shoulder pain. stiffness, dye allergy. cosmetic deformity , blood clots, death, need for more surgery. Pt agres to proceed.  Current Plans You are being scheduled for surgery- Our schedulers will call you.  You should hear from our office's scheduling department within 5 working days about the location, date, and time of surgery. We try to make accommodations for patient's preferences in scheduling surgery, but sometimes the OR schedule or the surgeon's schedule prevents [place] from making those accommodations.  If you have not heard from our office [phone]) in 5 working days, call the office and ask for your surgeon's  nurse.  If you have other questions about your diagnosis, plan, or surgery, call the office and ask for your surgeon's nurse.  Pt Education - [facility] Breast Cancer Information Given - [facility] "Breast Journey" Package Pt Education - Pamphlet Given - Breast Biopsy: discussed with patient and provided information. We discussed the staging and pathophysiology of breast cancer. We discussed all of the different options for treatment for breast cancer including surgery, chemotherapy, radiation therapy, Herceptin, and antiestrogen therapy. We discussed a sentinel lymph node biopsy as she does not appear to having lymph node involvement right now. We discussed the performance of that with injection of radioactive tracer and blue dye. We discussed that she would have an incision underneath her axillary hairline. We discussed that there is a bout a 10-20% chance of having a positive node with a sentinel  lymph node biopsy and we will await the permanent pathology to make any other first further decisions in terms of her treatment. One of these options might be to return to the operating room to perform an axillary lymph node dissection. We discussed about a 1-2% risk lifetime of chronic shoulder pain as well as lymphedema associated with a sentinel lymph node biopsy. We discussed the options for treatment of the breast cancer which included lumpectomy versus a mastectomy. We discussed the performance of the lumpectomy with a wire placement. We discussed a 10-20% chance of a positive margin requiring reexcision in the operating room. We also discussed that she may need radiation therapy or antiestrogen therapy or both if she undergoes lumpectomy. We discussed the mastectomy and the postoperative care for that as well. We discussed that there is no difference in her survival whether she undergoes lumpectomy with radiation therapy or antiestrogen therapy versus a mastectomy. There is a slight difference in the local  recurrence rate being 3-5% with lumpectomy and about 1% with a mastectomy. We discussed the risks of operation including bleeding, infection, possible reoperation. She understands her further therapy will be based on what her stages at the time of her operation.  Pt Education - breast cancer surgery: discussed with patient and provided information. Pt Education - [facility] Breast Biopsy HCI: discussed with patient and provided information. Pt Education - ABC (After Breast Cancer) Class Info: discussed with patient and provided information.

## 2017-07-16 NOTE — Discharge Instructions (Signed)
[facility] °Office Phone Number [phone] ° °BREAST BIOPSY/ PARTIAL MASTECTOMY: POST OP INSTRUCTIONS ° °Always review your discharge instruction sheet given to you by the facility where your surgery was performed. ° °IF YOU HAVE DISABILITY OR FAMILY LEAVE FORMS, YOU MUST BRING THEM TO THE OFFICE FOR PROCESSING.  DO NOT GIVE THEM TO YOUR DOCTOR. ° °1. A prescription for pain medication may be given to you upon discharge.  Take your pain medication as prescribed, if needed.  If narcotic pain medicine is not needed, then you may take acetaminophen (Tylenol) or ibuprofen (Advil) as needed. °2. Take your usually prescribed medications unless otherwise directed °3. If you need a refill on your pain medication, please contact your pharmacy.  They will contact our office to request authorization.  Prescriptions will not be filled after 5pm or on week-ends. °4. You should eat very light the first 24 hours after surgery, such as soup, crackers, pudding, etc.  Resume your normal diet the day after surgery. °5. Most patients will experience some swelling and bruising in the breast.  Ice packs and a good support bra will help.  Swelling and bruising can take several days to resolve.  °6. It is common to experience some constipation if taking pain medication after surgery.  Increasing fluid intake and taking a stool softener will usually help or prevent this problem from occurring.  A mild laxative (Milk of Magnesia or Miralax) should be taken according to package directions if there are no bowel movements after 48 hours. °7. Unless discharge instructions indicate otherwise, you may remove your bandages 24-48 hours after surgery, and you may shower at that time.  You may have steri-strips (small skin tapes) in place directly over the incision.  These strips should be left on the skin for 7-10 days.  If your surgeon used skin glue on the incision, you may shower in 24 hours.  The glue will flake off over the  next 2-3 weeks.  Any sutures or staples will be removed at the office during your follow-up visit. °8. ACTIVITIES:  You may resume regular daily activities (gradually increasing) beginning the next day.  Wearing a good support bra or sports bra minimizes pain and swelling.  You may have sexual intercourse when it is comfortable. °a. You may drive when you no longer are taking prescription pain medication, you can comfortably wear a seatbelt, and you can safely maneuver your car and apply brakes. °b. RETURN TO WORK:  ______________________________________________________________________________________ °9. You should see your doctor in the office for a follow-up appointment approximately two weeks after your surgery.  Your doctor’s nurse will typically make your follow-up appointment when she calls you with your pathology report.  Expect your pathology report 2-3 business days after your surgery.  You may call to check if you do not hear from us after three days. °10. OTHER INSTRUCTIONS: _______________________________________________________________________________________________ _____________________________________________________________________________________________________________________________________ °_____________________________________________________________________________________________________________________________________ °_____________________________________________________________________________________________________________________________________ ° °WHEN TO CALL YOUR DOCTOR: °1. Fever over 101.0 °2. Nausea and/or vomiting. °3. Extreme swelling or bruising. °4. Continued bleeding from incision. °5. Increased pain, redness, or drainage from the incision. ° °The clinic staff is available to answer your questions during regular business hours.  Please don’t hesitate to call and ask to speak to one of the nurses for clinical concerns.  If you have a medical emergency, go to the nearest  emergency room or call 911.  A surgeon from [facility] is always on call at the hospital. ° °For further questions, please visit [email or web address]  ° ° ° ° °  Post Anesthesia Home Care Instructions ° °Activity: °Get plenty of rest for the remainder of the day. A responsible individual must stay with you for 24 hours following the procedure.  °For the next 24 hours, DO NOT: °-Drive a car °-Operate machinery °-Drink alcoholic beverages °-Take any medication unless instructed by your physician °-Make any legal decisions or sign important papers. ° °Meals: °Start with liquid foods such as gelatin or soup. Progress to regular foods as tolerated. Avoid greasy, spicy, heavy foods. If nausea and/or vomiting occur, drink only clear liquids until the nausea and/or vomiting subsides. Call your physician if vomiting continues. ° °Special Instructions/Symptoms: °Your throat may feel dry or sore from the anesthesia or the breathing tube placed in your throat during surgery. If this causes discomfort, gargle with warm salt water. The discomfort should disappear within 24 hours. ° °If you had a scopolamine patch placed behind your ear for the management of post- operative nausea and/or vomiting: ° °1. The medication in the patch is effective for 72 hours, after which it should be removed.  Wrap patch in a tissue and discard in the trash. Wash hands thoroughly with soap and water. °2. You may remove the patch earlier than 72 hours if you experience unpleasant side effects which may include dry mouth, dizziness or visual disturbances. °3. Avoid touching the patch. Wash your hands with soap and water after contact with the patch. °  ° °

## 2017-07-16 NOTE — Anesthesia Procedure Notes (Signed)
Procedure Name: LMA Insertion Date/Time: 07/16/2017 8:31 AM Performed by: Toula Moos L Pre-anesthesia Checklist: Patient identified, Emergency Drugs available, Suction available, Patient being monitored and Timeout performed Patient Re-evaluated:Patient Re-evaluated prior to induction Oxygen Delivery Method: Circle system utilized Preoxygenation: Pre-oxygenation with 100% oxygen Induction Type: IV induction Ventilation: Mask ventilation without difficulty LMA: LMA inserted LMA Size: 4.0 Number of attempts: 1 Airway Equipment and Method: Bite block Placement Confirmation: positive ETCO2 Tube secured with: Tape Dental Injury: Teeth and Oropharynx as per pre-operative assessment

## 2017-07-16 NOTE — Anesthesia Procedure Notes (Addendum)
Anesthesia Regional Block: Pectoralis block   Pre-Anesthetic Checklist: ,, timeout performed, Correct Patient, Correct Site, Correct Laterality, Correct Procedure, Correct Position, site marked, Risks and benefits discussed,  Surgical consent,  Pre-op evaluation,  At surgeon's request and post-op pain management  Laterality: Left  Prep: chloraprep       Needles:  Injection technique: Single-shot  Needle Type: Echogenic Stimulator Needle     Needle Length: 5cm  Needle Gauge: 22     Additional Needles:   Procedures: ultrasound guided, nerve stimulator,,,,,,  Narrative:  Start time: 07/16/2017 8:07 AM End time: 07/16/2017 8:20 AM Injection made incrementally with aspirations every 5 mL.  Performed by: Personally  Anesthesiologist: Latria Mccarron  Additional Notes: Functioning IV was confirmed and monitors were applied.  A 64mm 22ga Arrow echogenic stimulator needle was used. Sterile prep and drape,hand hygiene and sterile gloves were used. Ultrasound guidance: relevant anatomy identified, needle position confirmed, local anesthetic spread visualized around nerve(s)., vascular puncture avoided.  Image printed for medical record. Negative aspiration and negative test dose prior to incremental administration of local anesthetic. The patient tolerated the procedure well.

## 2017-07-16 NOTE — Progress Notes (Signed)
Assisted Dr. Oddono with left, ultrasound guided, pectoralis block. Side rails up, monitors on throughout procedure. See vital signs in flow sheet. Tolerated Procedure well. °

## 2017-07-16 NOTE — Anesthesia Postprocedure Evaluation (Signed)
Anesthesia Post Note  Patient: [name]  Procedure(s) Performed: Procedure(s) (LRB): LEFT BREAST LUMPECTOMY WITH RADIOACTIVE SEED AND SENTINEL LYMPH NODE BIOPSY (Left) RIGHT BREAST LUMPECTOMY WITH RADIOACTIVE SEED LOCALIZATION (Right)     Patient location during evaluation: PACU Anesthesia Type: General Level of consciousness: awake and alert Pain management: pain level controlled Vital Signs Assessment: post-procedure vital signs reviewed and stable Respiratory status: spontaneous breathing, nonlabored ventilation, respiratory function stable and patient connected to nasal cannula oxygen Cardiovascular status: blood pressure returned to baseline and stable Postop Assessment: no signs of nausea or vomiting Anesthetic complications: no    Last Vitals:  Vitals:   07/16/17 1045 07/16/17 1115  BP: (!) 165/85 136/72  Pulse: 73 69  Resp: 15 18  Temp:  36.9 C  SpO2: 97% 97%    Last Pain:  Vitals:   07/16/17 1115  TempSrc:   PainSc: 3                  Larah Kuntzman

## 2017-07-16 NOTE — Interval H&P Note (Signed)
History and Physical Interval Note:  07/16/2017 7:44 AM  [name]  has presented today for surgery, with the diagnosis of left breast cancer, RIGHT FIBROCYSTIC DISEASE RIGHT BREAST  The various methods of treatment have been discussed with the patient and family. After consideration of risks, benefits and other options for treatment, the patient has consented to  Procedure(s) with comments: LEFT BREAST LUMPECTOMY WITH RADIOACTIVE SEED AND SENTINEL LYMPH NODE BIOPSY (Left) - PECTORAL BLOCK RIGHT BREAST LUMPECTOMY WITH RADIOACTIVE SEED LOCALIZATION (Right) as a surgical intervention .  The patient's history has been reviewed, patient examined, no change in status, stable for surgery.  I have reviewed the patient's chart and labs.  Questions were answered to the patient's satisfaction.   Added right breast seed localized lumpectomy after discussion with radiology about lesion being discordant on imaging and excision recommended.   The procedure has been discussed with the patient. Alternatives to surgery have been discussed with the patient.  Risks of surgery include bleeding,  Infection,  Seroma formation, death,  and the need for further surgery.   The patient understands and wishes to proceed.  Sentinel lymph node mapping and dissection has been discussed with the patient.  Risk of bleeding,  Infection,  Seroma formation,  Additional procedures,,  Shoulder weakness ,  Shoulder stiffness,  Nerve and blood vessel injury and reaction to the mapping dyes have been discussed.  Alternatives to surgery have been discussed with the patient.  The patient agrees to proceed.  Meaghan Whistler A.

## 2017-07-16 NOTE — Transfer of Care (Signed)
Immediate Anesthesia Transfer of Care Note  Patient: [name]  Procedure(s) Performed: Procedure(s): LEFT BREAST LUMPECTOMY WITH RADIOACTIVE SEED AND SENTINEL LYMPH NODE BIOPSY (Left) RIGHT BREAST LUMPECTOMY WITH RADIOACTIVE SEED LOCALIZATION (Right)  Patient Location: PACU  Anesthesia Type:GA combined with regional for post-op pain  Level of Consciousness: awake and patient cooperative  Airway & Oxygen Therapy: Patient Spontanous Breathing and Patient connected to face mask oxygen  Post-op Assessment: Report given to RN and Post -op Vital signs reviewed and stable  Post vital signs: Reviewed and stable  Last Vitals:  Vitals:   07/16/17 0815 07/16/17 1007  BP: (!) 114/54 (!) 145/71  Pulse: (!) 49 72  Resp: 13 19  Temp:    SpO2: 99% 98%    Last Pain:  Vitals:   07/16/17 0711  TempSrc: Oral         Complications: No apparent anesthesia complications

## 2017-07-16 NOTE — Op Note (Signed)
Preoperative diagnosis: Stage I left breast cancer and right breast mass  Postoperative diagnosis: Same  Procedure: Right breast seed localized lumpectomy left breast seed localized lumpectomy left axillary sentinel lymph node mapping for deep left axillary sentinel node injection of methylene blue dye  Surgeon: Erroll Luna M.D.  Anesthesia: LMA with left pectoral block local  EBL: 30 mL  Specimens: Right breast mass with seed and  clip left breast mass with seed  and clip with grossly negative margins 1 left axillary sentinel node blue and hot   Drains: None  Indications for procedure: Patient presents her treatment of her left breast cancer. She is opted for breast conservation. She also has a discordant right breast mass by biopsy. Excision was recommended. She agreed to proceed with the above procedure.The procedure has been discussed with the patient. Alternatives to surgery have been discussed with the patient.  Risks of surgery include bleeding,  Infection,  Seroma formation, death,  and the need for further surgery.   The patient understands and wishes to proceed.Sentinel lymph node mapping and dissection has been discussed with the patient.  Risk of bleeding,  Infection,  Seroma formation,  Additional procedures,,  Shoulder weakness ,  Shoulder stiffness,  Nerve and blood vessel injury and reaction to the mapping dyes have been discussed.  Alternatives to surgery have been discussed with the patient.  The patient agrees to proceed.     Description of procedure: The patient was met in the holding area and questions are answered. She underwent left pectoral block by anesthesia. She had seeds placed by radiology in both breasts. She underwent injection by nuclear medicine left breast. Both sites were marked. All of her questions are answered. She's taken back to the operating room placed upon the operating room table. After elevating anesthesia both breasts were prepped and draped in  sterile fashion and timeout was done. 4 mL of methylene blue dye were injected under left nipple. This was massaged. The right side was done first. Neoprobe was used to localize the seed clip in the right upper outer quadrant. Incision was made in the right axilla dissection was carried down to excise all tissue around the seed and clip. Gross margins negative. Irrigated value be hemostatic. Was closed in layers with deep layer of 3-0 Vicryl and 4-0 Monocryl.  Left breast was approached in similar fashion.  The seed and clip were localized with neoprobe. A curvilinear incision was made along the lateral border of the nipple areolar complex. Dissection was carried down and all tissue around the seed and clip were excised in its entirety. Gross margins were negative. Radiograph was taken of both specimens which showed the seed and clips to be present with grossly negative margins. The cavity was then clipped. Hemostasis was achieved. The cavity was closed with 3-0 Vicryl and 4-0 Monocryl.  The left axilla was done next. The neoprobe settings were changed to technetium. Hotspot was identified in the left axilla. Dissection was carried down into the deep axillary contents. A blue and hot node  were identified in the deep axillary level I contents. Background counts approached 0. Hemostasis achieved. The wound was irrigated and closed in layers after hemostasis. All final counts found to be correct sponge, needles and instruments. Dermabond applied to all incisions. A breast binder was placed. Patient was awoke extubated taken to recovery in satisfactory condition.

## 2017-07-17 ENCOUNTER — Encounter (HOSPITAL_BASED_OUTPATIENT_CLINIC_OR_DEPARTMENT_OTHER): Payer: Self-pay | Admitting: Surgery

## 2017-07-23 NOTE — Progress Notes (Addendum)
Location of Breast Cancer: Left Breast  Histology per Pathology Report: 06/25/17  Diagnosis 1. Breast, left, needle core biopsy, upper outer - INVASIVE MAMMARY CARCINOMA, SEE COMMENT. 2. Breast, right, needle core biopsy, 10:00 o'clock - FIBROCYSTIC CHANGE. - NO MALIGNANCY IDENTIFIED.  Receptor Status: ER(100%), PR (100%), Her2-neu (NEG), Ki-(5%)  07/16/17 Diagnosis 1. Breast, lumpectomy, Right w/seed - INVASIVE DUCTAL CARCINOMA, GRADE I/III, SPANNING 0.4 CM - DUCTAL CARCINOMA IN SITU, LOW GRADE. - THE SURGICAL RESECTION MARGINS ARE NEGATIVE FOR CARCINOMA. - SEE ONCOLOGY TABLE BELOW. 2. Breast, lumpectomy, Left w/seed - INVASIVE DUCTAL CARCINOMA, GRADE I/III, SPANNING 1.6 CM. - THE SURGICAL RESECTION MARGINS ARE NEGATIVE FOR CARCINOMA. - SEE ONCOLOGY TABLE BELOW. 3. Lymph node, sentinel, biopsy, Left Axillary - THERE IS NO EVIDENCE OF CARCINOMA IN 1 OF 1 LYMPH NODE (0/1).  Did patient present with symptoms or was this found on screening mammography?: It was found on a screening mammogram.   Past/Anticipated interventions by surgeon, if any: 07/16/17 Procedure: Right breast seed localized lumpectomy left breast seed localized lumpectomy left axillary sentinel lymph node mapping for deep left axillary sentinel node injection of methylene blue dye Surgeon: Erroll Luna M.D.  Past/Anticipated interventions by medical oncology, if any:  Dr. Jana Hakim 07/30/17 at 4:30  Lymphedema issues, if any:  none  Pain issues, if any:  None  SAFETY ISSUES:  Prior radiation?  No  Pacemaker/ICD? No  Possible current pregnancy? No  Is the patient on methotrexate? No   Vitals:   07/30/17 1325  BP: (!) 151/58  Pulse: 63  Resp: 20  Temp: 97.8 F (36.6 C)  TempSrc: Oral  SpO2: 98%  Weight: 191 lb 2 oz (86.7 kg)   Wt Readings from Last 3 Encounters:  07/30/17 191 lb 2 oz (86.7 kg)  07/16/17 185 lb 6 oz (84.1 kg)  12/19/16 180 lb (81.6 kg)     Current Complaints / other  details:      Malmfelt, Stephani Police, RN 07/23/2017,2:46 PM

## 2017-07-29 ENCOUNTER — Other Ambulatory Visit: Payer: Self-pay

## 2017-07-29 DIAGNOSIS — C50919 Malignant neoplasm of unspecified site of unspecified female breast: Secondary | ICD-10-CM

## 2017-07-30 ENCOUNTER — Ambulatory Visit
Admission: RE | Admit: 2017-07-30 | Discharge: 2017-07-30 | Disposition: A | Discharge status: Home / Self Care | Payer: Medicare Other | Source: Ambulatory Visit | Attending: Radiation Oncology | Admitting: Radiation Oncology

## 2017-07-30 ENCOUNTER — Ambulatory Visit (HOSPITAL_BASED_OUTPATIENT_CLINIC_OR_DEPARTMENT_OTHER): Payer: Medicare Other | Admitting: Oncology

## 2017-07-30 ENCOUNTER — Encounter: Payer: Self-pay | Admitting: Radiation Oncology

## 2017-07-30 ENCOUNTER — Other Ambulatory Visit (HOSPITAL_BASED_OUTPATIENT_CLINIC_OR_DEPARTMENT_OTHER): Payer: Medicare Other

## 2017-07-30 VITALS — BP 151/58 | HR 63 | Temp 97.8°F | Resp 20 | Wt 191.1 lb

## 2017-07-30 DIAGNOSIS — Z888 Allergy status to other drugs, medicaments and biological substances status: Secondary | ICD-10-CM | POA: Diagnosis not present

## 2017-07-30 DIAGNOSIS — C50112 Malignant neoplasm of central portion of left female breast: Secondary | ICD-10-CM | POA: Diagnosis not present

## 2017-07-30 DIAGNOSIS — M1990 Unspecified osteoarthritis, unspecified site: Secondary | ICD-10-CM | POA: Insufficient documentation

## 2017-07-30 DIAGNOSIS — Z170 Estrogen receptor positive status [ER+]: Secondary | ICD-10-CM

## 2017-07-30 DIAGNOSIS — Z885 Allergy status to narcotic agent status: Secondary | ICD-10-CM | POA: Insufficient documentation

## 2017-07-30 DIAGNOSIS — Z981 Arthrodesis status: Secondary | ICD-10-CM | POA: Insufficient documentation

## 2017-07-30 DIAGNOSIS — G2581 Restless legs syndrome: Secondary | ICD-10-CM | POA: Diagnosis not present

## 2017-07-30 DIAGNOSIS — C50411 Malignant neoplasm of upper-outer quadrant of right female breast: Secondary | ICD-10-CM

## 2017-07-30 DIAGNOSIS — H409 Unspecified glaucoma: Secondary | ICD-10-CM | POA: Insufficient documentation

## 2017-07-30 DIAGNOSIS — Z96651 Presence of right artificial knee joint: Secondary | ICD-10-CM | POA: Insufficient documentation

## 2017-07-30 DIAGNOSIS — I10 Essential (primary) hypertension: Secondary | ICD-10-CM | POA: Insufficient documentation

## 2017-07-30 DIAGNOSIS — Z961 Presence of intraocular lens: Secondary | ICD-10-CM | POA: Insufficient documentation

## 2017-07-30 DIAGNOSIS — Z820 Family history of epilepsy and other diseases of the nervous system: Secondary | ICD-10-CM | POA: Insufficient documentation

## 2017-07-30 DIAGNOSIS — Z9842 Cataract extraction status, left eye: Secondary | ICD-10-CM | POA: Diagnosis not present

## 2017-07-30 DIAGNOSIS — Z7982 Long term (current) use of aspirin: Secondary | ICD-10-CM | POA: Insufficient documentation

## 2017-07-30 DIAGNOSIS — Z8249 Family history of ischemic heart disease and other diseases of the circulatory system: Secondary | ICD-10-CM | POA: Insufficient documentation

## 2017-07-30 DIAGNOSIS — Z803 Family history of malignant neoplasm of breast: Secondary | ICD-10-CM | POA: Diagnosis not present

## 2017-07-30 DIAGNOSIS — Z880 Allergy status to penicillin: Secondary | ICD-10-CM | POA: Insufficient documentation

## 2017-07-30 DIAGNOSIS — H9190 Unspecified hearing loss, unspecified ear: Secondary | ICD-10-CM | POA: Diagnosis not present

## 2017-07-30 DIAGNOSIS — Z881 Allergy status to other antibiotic agents status: Secondary | ICD-10-CM | POA: Diagnosis not present

## 2017-07-30 DIAGNOSIS — Z79810 Long term (current) use of selective estrogen receptor modulators (SERMs): Secondary | ICD-10-CM | POA: Insufficient documentation

## 2017-07-30 DIAGNOSIS — E7800 Pure hypercholesterolemia, unspecified: Secondary | ICD-10-CM | POA: Diagnosis not present

## 2017-07-30 DIAGNOSIS — Z87440 Personal history of urinary (tract) infections: Secondary | ICD-10-CM | POA: Diagnosis not present

## 2017-07-30 DIAGNOSIS — F329 Major depressive disorder, single episode, unspecified: Secondary | ICD-10-CM | POA: Diagnosis not present

## 2017-07-30 DIAGNOSIS — Z9841 Cataract extraction status, right eye: Secondary | ICD-10-CM | POA: Diagnosis not present

## 2017-07-30 DIAGNOSIS — C50412 Malignant neoplasm of upper-outer quadrant of left female breast: Secondary | ICD-10-CM

## 2017-07-30 DIAGNOSIS — Z79899 Other long term (current) drug therapy: Secondary | ICD-10-CM | POA: Insufficient documentation

## 2017-07-30 DIAGNOSIS — Z510 Encounter for antineoplastic radiation therapy: Secondary | ICD-10-CM | POA: Insufficient documentation

## 2017-07-30 DIAGNOSIS — C50919 Malignant neoplasm of unspecified site of unspecified female breast: Secondary | ICD-10-CM

## 2017-07-30 LAB — CBC WITH DIFFERENTIAL/PLATELET
BASO%: 0.7 % (ref 0.0–2.0)
Basophils Absolute: 0.1 10*3/uL (ref 0.0–0.1)
EOS%: 8.1 % — AB (ref 0.0–7.0)
Eosinophils Absolute: 0.7 10*3/uL — ABNORMAL HIGH (ref 0.0–0.5)
HCT: 38.7 % (ref 34.8–46.6)
HGB: 12.8 g/dL (ref 11.6–15.9)
LYMPH#: 1.5 10*3/uL (ref 0.9–3.3)
LYMPH%: 17 % (ref 14.0–49.7)
MCH: 31.1 pg (ref 25.1–34.0)
MCHC: 32.9 g/dL (ref 31.5–36.0)
MCV: 94.3 fL (ref 79.5–101.0)
MONO#: 0.5 10*3/uL (ref 0.1–0.9)
MONO%: 5.2 % (ref 0.0–14.0)
NEUT%: 69 % (ref 38.4–76.8)
NEUTROS ABS: 6.1 10*3/uL (ref 1.5–6.5)
PLATELETS: 203 10*3/uL (ref 145–400)
RBC: 4.11 10*6/uL (ref 3.70–5.45)
RDW: 13.8 % (ref 11.2–14.5)
WBC: 8.8 10*3/uL (ref 3.9–10.3)

## 2017-07-30 LAB — COMPREHENSIVE METABOLIC PANEL
ALT: 16 U/L (ref 0–55)
AST: 21 U/L (ref 5–34)
Albumin: 3.9 g/dL (ref 3.5–5.0)
Alkaline Phosphatase: 76 U/L (ref 40–150)
Anion Gap: 8 mEq/L (ref 3–11)
BUN: 20.6 mg/dL (ref 7.0–26.0)
CALCIUM: 9.4 mg/dL (ref 8.4–10.4)
CHLORIDE: 108 meq/L (ref 98–109)
CO2: 27 meq/L (ref 22–29)
CREATININE: 0.8 mg/dL (ref 0.6–1.1)
EGFR: 70 mL/min/{1.73_m2} — ABNORMAL LOW (ref >90)
GLUCOSE: 87 mg/dL (ref 70–140)
Potassium: 4 mEq/L (ref 3.5–5.1)
SODIUM: 142 meq/L (ref 136–145)
Total Bilirubin: 0.34 mg/dL (ref 0.20–1.20)
Total Protein: 7.8 g/dL (ref 6.4–8.3)

## 2017-07-30 LAB — DRAW EXTRA CLOT TUBE

## 2017-07-30 MED ORDER — TAMOXIFEN CITRATE 20 MG PO TABS
20.0000 mg | ORAL_TABLET | Freq: Every day | ORAL | 12 refills | Status: AC
Start: 2017-07-30 — End: 2017-08-29

## 2017-07-30 NOTE — Progress Notes (Signed)
[place]  [phone] Fax:(336) [phone]     ID: [name] DOB: 23-Jan-1936  MR#: [id number]  BMW#:[id number]  Patient Care Team: Biagio Borg, MD as PCP - General Agron Swiney, Virgie Dad, MD as Consulting Physician (Oncology) Erroll Luna, MD as Consulting Physician (General Surgery) Eppie Gibson, MD as Attending Physician (Radiation Oncology) Chauncey Cruel, MD OTHER MD:  CHIEF COMPLAINT: Bilateral estrogen receptor positive breast cancer  CURRENT TREATMENT: Tamoxifen; adjuvant radiation pending   HISTORY OF CURRENT ILLNESS: The patient had screening mammography July 2018 showing a possible left breast mass and right breast asymmetry. On 06/23/2017 she underwent bilateral diagnostic mammography with tomography and bilateral breast ultrasonography. This found the breast density to be category C. In the left breast there was evidence of prior excisional biopsy. More centrally there was a 1.4 cm irregular mass with architectural distortion. This was not palpable. Ultrasound was not informative. Ultrasound of the left axilla was benign.  On the right, in the upper outer quadrant there was a small focal area of architectural distortion, also not palpable. Ultrasound of the right breast 10:00 position 7 cm from the nipple found a 0.5 cm irregular hypoechoic mass. The right axilla was benign.  Biopsy of the 2 areas in question 06/25/2017, showed, on the left, invasive ductal carcinoma, E-cadherin positive, estrogen receptor 100% positive, progesterone receptor 100% positive, both with strong staining intensity, HER-2 not amplified, with an MIB-1 a 1.29 and the number per cell 1.80. The MIB-1 was 5% (S [phone])  On the right biopsy showed fibrocystic change but this was felt to be discordant and excision was recommended.  On 07/16/2017 the patient underwent bilateral lumpectomies and left axillary sentinel lymph node sampling. On the left there was an  invasive ductal carcinoma, grade 1, measuring 1.6 cm. Margins were negative. The single sentinel lymph node was clear.  On the right there was an invasive ductal carcinoma, grade 1, measuring 0.4 cm, with negative margins. This tumor was estrogen receptor 100% positive, progesterone receptor 100% positive, both with strong staining intensity, with an MIB-1 of 5%, and HER-2 nonamplified, with a signals ratio of 1.39 and number per cell 1.95.  The patient's subsequent history is as detailed below.  INTERVAL HISTORY: [name] was evaluated in the breast clinic 07/30/2017 accompanied by her daughter [name]. Her case was also presented at the multidisciplinary breast cancer conference on the same day. At that time the consensus was that she would not derive enough benefit from chemotherapy, but would benefit from anti-estrogens and adjuvant radiation.   REVIEW OF SYSTEMS: There were no specific symptoms leading to the original mammogram, which was routinely scheduled. She did well with her surgery, with no significant pain, bleeding, fever, or other complications. The patient denies unusual headaches, visual changes, nausea, vomiting, stiff neck, dizziness, or gait imbalance. There has been no cough, phlegm production, or pleurisy, no chest pain or pressure, and no change in bowel or bladder habits. The patient denies fever, rash, bleeding, unexplained fatigue or unexplained weight loss. A detailed review of systems was otherwise entirely negative.   PAST MEDICAL HISTORY: Past Medical History:  Diagnosis Date  . Breast cancer, left ([place]) 06/2017  . Depression   . Glaucoma   . High cholesterol   . HYPERTENSION    states under control with med., has been on med. x 1 yr.  . Impaired hearing   . Osteoarthritis    right knee  . Recurrent UTI   . Restless leg syndrome   .  Seasonal allergies     PAST SURGICAL HISTORY: Past Surgical History:  Procedure Laterality Date  . ANTERIOR CERVICAL  DECOMP/DISCECTOMY FUSION  07/30/2011   C4-5, C5-6, C6-7  . BREAST LUMPECTOMY WITH RADIOACTIVE SEED AND SENTINEL LYMPH NODE BIOPSY Left 07/16/2017   Procedure: LEFT BREAST LUMPECTOMY WITH RADIOACTIVE SEED AND SENTINEL LYMPH NODE BIOPSY;  Surgeon: Erroll Luna, MD;  Location: [facility];  Service: General;  Laterality: Left;  . BREAST LUMPECTOMY WITH RADIOACTIVE SEED LOCALIZATION Right 07/16/2017   Procedure: RIGHT BREAST LUMPECTOMY WITH RADIOACTIVE SEED LOCALIZATION;  Surgeon: Erroll Luna, MD;  Location: [facility];  Service: General;  Laterality: Right;  . BREAST SURGERY Left 04/10/2007   exc. calcifications  . CATARACT EXTRACTION W/ INTRAOCULAR LENS  IMPLANT, BILATERAL    . CERVICAL FUSION  2011  . COLONOSCOPY WITH PROPOFOL  12/27/2013  . DIAGNOSTIC LAPAROSCOPY Right 08/21/2005   drainage and marsupialization of hepatic cyst  . FOOT SURGERY Right 1998  . KNEE ARTHROSCOPY Left 2002  . LUMBAR FUSION Left 02/12/2011   L3-4  . TOTAL KNEE ARTHROPLASTY Right 11/15/2016   Procedure: RIGHT TOTAL KNEE ARTHROPLASTY;  Surgeon: Netta Cedars, MD;  Location: [facility];  Service: Orthopedics;  Laterality: Right;  . TUBAL LIGATION  1970    FAMILY HISTORY Family History  Problem Relation Age of Onset  . Hypertension Mother   . Alzheimer's disease Mother   . Parkinsonism Mother   . Cancer Father        prostate cancer  . Alzheimer's disease Sister   The patient's father died from prostate cancer at the age of 2. The patient's mother died with dementia at the age of 25. The patient had no brothers, 1 sister. One cousin was diagnosed with breast cancer in her 37s. Also the patient's daughter was diagnosed with breast cancer at age 10. The patient's daughter has been genetically tested on more than one occasion and so far no deleterious mutation has been identified  GYNECOLOGIC HISTORY:  No LMP recorded. Patient is postmenopausal. Menarche age 42, first live birth age  48, the patient is Needles P2. She went through the change of life approximately age 89. She used oral contraceptives briefly, but this was stopped when she had postpartum phlebitis concerns. She never used hormone replacement. 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 She is widowed, lives alone except for her cat. Her children are [name], who is a Therapist, nutritional (place [employer] her local [employer]) and cells.. Son [name] lives in [place] and works as a Engineer, maintenance (IT). The patient has 3 grandchildren. She is a Psychologist, forensic.    ADVANCED DIRECTIVES:    HEALTH MAINTENANCE: Social History  Substance Use Topics  . Smoking status: Never Smoker  . Smokeless tobacco: Never Used  . Alcohol use No     Colonoscopy: 12/27/2013/([facility])  PAP:  Bone density: 01/20/2017, T score -1.5   Allergies  Allergen Reactions  . Codeine Nausea And Vomiting  . Hydromorphone Other (See Comments)    OVERSEDATION  . Moxifloxacin Other (See Comments)    YEAST INFECTION  . Oxycodone Other (See Comments)    ALTERED MENTAL STATE  . Penicillins Itching and Other (See Comments)    YEAST INFECTION  . Quinine Derivatives Other (See Comments)    DIZZINESS    Current Outpatient Prescriptions  Medication  Sig Dispense Refill  . acetaminophen (TYLENOL) 325 MG tablet Take 650 mg by mouth every 6 (six) hours as needed.    . ALPRAZolam (XANAX) 0.25 MG tablet Take 0.25 mg by mouth at bedtime as needed for anxiety.    Marland Kitchen amLODipine (NORVASC) 2.5 MG tablet Take 2.5 mg by mouth daily.    Marland Kitchen aspirin EC 81 MG tablet Take 81 mg by mouth daily.    Marland Kitchen azithromycin (ZITHROMAX) 250 MG tablet azithromycin 250 mg tablet    . clobetasol cream (TEMOVATE) 6.62 % Apply 1 application topically 2 (two) times daily.    Marland Kitchen CRANBERRY PO Take 2 tablets by mouth 2 (two) times daily.     Marland Kitchen EPIPEN 2-PAK 0.3 MG/0.3ML SOAJ injection Inject 0.3 mg into the skin as needed (allergic reaction).   12  . estradiol (ESTRACE) 0.1 MG/GM vaginal cream Place 1 Applicatorful vaginally 2 (two) times a week.    . fluconazole (DIFLUCAN) 100 MG tablet fluconazole 100 mg tablet    . gabapentin (NEURONTIN) 300 MG capsule Take 300 mg by mouth 2 (two) times daily.    Marland Kitchen gentamicin ointment (GARAMYCIN) 0.1 % Apply 1 application topically 3 (three) times daily. INTRANASALLY    . gentamicin ointment (GARAMYCIN) 0.1 % gentamicin sulfate 0.1 % oint    . hydrochlorothiazide (HYDRODIURIL) 25 MG tablet hydrochlorothiazide 25 mg tablet    . hydrocortisone (ANUSOL-HC) 25 MG suppository Place 25 mg rectally 2 (two) times daily as needed.     . hydrocortisone (PROCTOZONE-HC) 2.5 % rectal cream proctozone-hc 2.5 % crea    . latanoprost (XALATAN) 0.005 % ophthalmic solution Place 1 drop into both eyes at bedtime.    . Multiple Vitamin (MULTIVITAMIN) tablet Take 1 tablet by mouth daily.    . nitrofurantoin (MACRODANTIN) 100 MG capsule Take 100 mg by mouth 4 (four) times daily.    Marland Kitchen nystatin cream (MYCOSTATIN) Apply 1 application topically as needed for dry skin.    Marland Kitchen nystatin-triamcinolone (MYCOLOG II) cream nystatin-triamcinolone 100,000 unit/g-0.1 % topical cream    . pravastatin (PRAVACHOL) 40 MG tablet Take 40 mg by mouth daily.    . sertraline (ZOLOFT) 100 MG tablet Take 100 mg by mouth 2 (two) times daily.    . sertraline (ZOLOFT) 100 MG tablet sertraline hcl 100 mg tabs    . terconazole (TERAZOL 7) 0.4 % vaginal cream Place 1 applicator vaginally daily as needed (discomfort).    . timolol (BETIMOL) 0.5 % ophthalmic solution Place 1 drop into both eyes daily.    . traMADol (ULTRAM) 50 MG tablet Take 1 tablet (50 mg total) by mouth every 6 (six) hours as needed. 15 tablet 0  . Vitamins-Lipotropics (LIPOFLAVONOID PO) Take 2 tablets by mouth 2 (two) times daily.    . naproxen (NAPROSYN) 375 MG tablet Take 1  tablet (375 mg total) by mouth 2 (two) times daily with a meal. (Patient not taking: Reported on 07/30/2017) 30 tablet 2   No current facility-administered medications for this visit.     OBJECTIVE: Older white woman who appears well  Vitals:   07/30/17 1543  BP: (!) 147/70  Pulse: (!) 57  Resp: 17  Temp: 97.6 F (36.4 C)  SpO2: 97%     Body mass index is 31.73 kg/m.   Wt Readings from Last 3 Encounters:  07/30/17 190 lb 11.2 oz (86.5 kg)  07/30/17 191 lb 2 oz (86.7 kg)  07/16/17 185 lb 6 oz (84.1 kg)      ECOG FS:0 -  Asymptomatic  Ocular: Sclerae unicteric, pupils round and equal Ear-nose-throat: Oropharynx clear and moist Lymphatic: No cervical or supraclavicular adenopathy Lungs no rales or rhonchi Heart regular rate and rhythm Abd soft, nontender, positive bowel sounds MSK no focal spinal tenderness, no joint edema Neuro: non-focal, well-oriented, appropriate affect Breasts: The right breast is status post recent lumpectomy. The cosmetic result is excellent. The incision is healing nicely without erythema, swelling, or dehiscence. The left breast is status post lumpectomy and sentinel lymph node sampling. The cosmetic result is also excellent. The incisions are healing nicely, without dehiscence, swelling, or erythema. Both axillae are benign.   LAB RESULTS:  CMP     Component Value Date/Time   NA 142 07/30/2017 1527   K 4.0 07/30/2017 1527   CL 104 07/15/2017 1008   CO2 27 07/30/2017 1527   GLUCOSE 87 07/30/2017 1527   GLUCOSE 88 09/09/2006 1613   BUN 20.6 07/30/2017 1527   CREATININE 0.8 07/30/2017 1527   CALCIUM 9.4 07/30/2017 1527   PROT 7.8 07/30/2017 1527   ALBUMIN 3.9 07/30/2017 1527   AST 21 07/30/2017 1527   ALT 16 07/30/2017 1527   ALKPHOS 76 07/30/2017 1527   BILITOT 0.34 07/30/2017 1527   GFRNONAA >60 07/15/2017 1008   GFRAA >60 07/15/2017 1008    No results found for: TOTALPROTELP, ALBUMINELP, A1GS, A2GS, BETS, BETA2SER, GAMS, MSPIKE,  SPEI  No results found for: Nils Pyle, [facility]  Lab Results  Component Value Date   WBC 8.8 07/30/2017   NEUTROABS 6.1 07/30/2017   HGB 12.8 07/30/2017   HCT 38.7 07/30/2017   MCV 94.3 07/30/2017   PLT 203 07/30/2017      Chemistry      Component Value Date/Time   NA 142 07/30/2017 1527   K 4.0 07/30/2017 1527   CL 104 07/15/2017 1008   CO2 27 07/30/2017 1527   BUN 20.6 07/30/2017 1527   CREATININE 0.8 07/30/2017 1527      Component Value Date/Time   CALCIUM 9.4 07/30/2017 1527   ALKPHOS 76 07/30/2017 1527   AST 21 07/30/2017 1527   ALT 16 07/30/2017 1527   BILITOT 0.34 07/30/2017 1527       No results found for: LABCA2  No components found for: [id number]  No results for input(s): INR in the last 168 hours.  No results found for: LABCA2  No results found for: TMH962  No results found for: IWL798  No results found for: XQJ194  No results found for: CA2729  No components found for: HGQUANT  No results found for: CEA1 / No results found for: CEA1   No results found for: AFPTUMOR  No results found for: [place]  No results found for: PSA1  Appointment on 07/30/2017  Component Date Value Ref Range Status  . WBC 07/30/2017 8.8  3.9 - 10.3 10e3/uL Final  . NEUT# 07/30/2017 6.1  1.5 - 6.5 10e3/uL Final  . HGB 07/30/2017 12.8  11.6 - 15.9 g/dL Final  . HCT 07/30/2017 38.7  34.8 - 46.6 % Final  . Platelets 07/30/2017 203  145 - 400 10e3/uL Final  . MCV 07/30/2017 94.3  79.5 - 101.0 fL Final  . MCH 07/30/2017 31.1  25.1 - 34.0 pg Final  . MCHC 07/30/2017 32.9  31.5 - 36.0 g/dL Final  . RBC 07/30/2017 4.11  3.70 - 5.45 10e6/uL Final  . RDW 07/30/2017 13.8  11.2 - 14.5 % Final  . lymph# 07/30/2017 1.5  0.9 - 3.3 10e3/uL Final  . MONO#  07/30/2017 0.5  0.1 - 0.9 10e3/uL Final  . Eosinophils Absolute 07/30/2017 0.7* 0.0 - 0.5 10e3/uL Final  . Basophils Absolute 07/30/2017 0.1  0.0 - 0.1 10e3/uL Final  . NEUT% 07/30/2017 69.0  38.4 -  76.8 % Final  . LYMPH% 07/30/2017 17.0  14.0 - 49.7 % Final  . MONO% 07/30/2017 5.2  0.0 - 14.0 % Final  . EOS% 07/30/2017 8.1* 0.0 - 7.0 % Final  . BASO% 07/30/2017 0.7  0.0 - 2.0 % Final  . Extra Tube 07/30/2017 Sample held in lab for 7 days   Final  . Sodium 07/30/2017 142  136 - 145 mEq/L Final  . Potassium 07/30/2017 4.0  3.5 - 5.1 mEq/L Final  . Chloride 07/30/2017 108  98 - 109 mEq/L Final  . CO2 07/30/2017 27  22 - 29 mEq/L Final  . Glucose 07/30/2017 87  70 - 140 mg/dl Final   Glucose reference range is for nonfasting patients. Fasting glucose reference range is 70- 100.  Marland Kitchen BUN 07/30/2017 20.6  7.0 - 26.0 mg/dL Final  . Creatinine 48/61/6122 0.8  0.6 - 1.1 mg/dL Final  . Total Bilirubin 07/30/2017 0.34  0.20 - 1.20 mg/dL Final  . Alkaline Phosphatase 07/30/2017 76  40 - 150 U/L Final  . AST 07/30/2017 21  5 - 34 U/L Final  . ALT 07/30/2017 16  0 - 55 U/L Final  . Total Protein 07/30/2017 7.8  6.4 - 8.3 g/dL Final  . Albumin 40/11/8095 3.9  3.5 - 5.0 g/dL Final  . Calcium 04/49/2524 9.4  8.4 - 10.4 mg/dL Final  . Anion Gap 15/90/1724 8  3 - 11 mEq/L Final  . EGFR 07/30/2017 70* >90 ml/min/1.73 m2 Final   eGFR is calculated using the CKD-EPI Creatinine Equation (2009)    (this displays the last labs from the last 3 days)  No results found for: TOTALPROTELP, ALBUMINELP, A1GS, A2GS, BETS, BETA2SER, GAMS, MSPIKE, SPEI (this displays SPEP labs)  No results found for: KPAFRELGTCHN, LAMBDASER, KAPLAMBRATIO (kappa/lambda light chains)  No results found for: HGBA, HGBA2QUANT, HGBFQUANT, HGBSQUAN (Hemoglobinopathy evaluation)   No results found for: LDH  No results found for: IRON, TIBC, IRONPCTSAT (Iron and TIBC)  No results found for: FERRITIN  Urinalysis    Component Value Date/Time   COLORURINE YELLOW 12/19/2016 1646   APPEARANCEUR CLEAR 12/19/2016 1646   LABSPEC <=1.005 (A) 12/19/2016 1646   PHURINE 6.0 12/19/2016 1646   GLUCOSEU NEGATIVE 12/19/2016 1646   HGBUR  NEGATIVE 12/19/2016 1646   BILIRUBINUR NEGATIVE 12/19/2016 1646   KETONESUR NEGATIVE 12/19/2016 1646   PROTEINUR 100 (A) 11/17/2016 1342   UROBILINOGEN 0.2 12/19/2016 1646   NITRITE NEGATIVE 12/19/2016 1646   LEUKOCYTESUR NEGATIVE 12/19/2016 1646     STUDIES: Mm Breast Surgical Specimen  Result Date: 07/16/2017 CLINICAL DATA:  Evaluate right specimen EXAM: SPECIMEN RADIOGRAPH OF THE RIGHT BREAST COMPARISON:  Previous exam(s). FINDINGS: Status post excision of the right breast. The radioactive seed and biopsy marker clip are present, completely intact, and were marked for pathology. IMPRESSION: Specimen radiograph of the right breast. Electronically Signed   By: Gerome Sam III M.D   On: 07/16/2017 09:29   Mm Breast Surgical Specimen  Result Date: 07/16/2017 CLINICAL DATA:  Evaluate left specimen EXAM: SPECIMEN RADIOGRAPH OF THE LEFT BREAST COMPARISON:  Previous exam(s). FINDINGS: Status post excision of the left breast. The radioactive seed and biopsy marker clip are present, completely intact, and were marked for pathology. IMPRESSION: Specimen radiograph of the left breast.  Electronically Signed   By: Dorise Bullion III M.D   On: 07/16/2017 09:29   Mm Lt Radioactive Seed Loc Mammo Guide  Result Date: 07/15/2017 CLINICAL DATA:  82 year old female presenting for radioactive seed localization of the bilateral breasts. EXAM: MAMMOGRAPHIC GUIDED RADIOACTIVE SEED LOCALIZATION OF THE BILATERAL BREAST COMPARISON:  Previous exam(s). FINDINGS: Patient presents for radioactive seed localization prior to left breast lumpectomy and right breast excisional biopsy. I met with the patient and we discussed the procedure of seed localization including benefits and alternatives. We discussed the high likelihood of a successful procedure. We discussed the risks of the procedure including infection, bleeding, tissue injury and further surgery. We discussed the low dose of radioactivity involved in the  procedure. Informed, written consent was given. The usual time-out protocol was performed immediately prior to the procedure. Using mammographic guidance, sterile technique, 1% lidocaine and an I-125 radioactive seed, the area of distortion in the right breast which is just superior to the ribbon shaped biopsy marking clip was localized using a lateral approach. The follow-up mammogram images confirm the seed in the expected location. Follow-up survey of the patient confirms presence of the radioactive seed. Order number of I-125 seed:  [id number]. Total activity:  4.098 millicuries  Reference Date: 06/26/2017 Using mammographic guidance, sterile technique, 1% lidocaine and an I-125 radioactive seed, the mass with the coil shaped biopsy marking clip was localized using a lateral approach. The follow-up mammogram images confirm the seed in the expected location. Follow-up survey of the patient confirms presence of the radioactive seed. Order number of I-125 seed:  [id number]. Total activity:  5.621 millicuries  Reference Date: 07/03/2017 The patient tolerated the procedure well and was released from the [facility]. She was given instructions regarding seed removal. IMPRESSION: Radioactive seed localization bilateral breasts. No apparent complications. Electronically Signed   By: Ammie Ferrier M.D.   On: 07/15/2017 13:54   Mm Rt Radioactive Seed Loc Mammo Guide  Result Date: 07/15/2017 CLINICAL DATA:  81 year old female presenting for radioactive seed localization of the bilateral breasts. EXAM: MAMMOGRAPHIC GUIDED RADIOACTIVE SEED LOCALIZATION OF THE BILATERAL BREAST COMPARISON:  Previous exam(s). FINDINGS: Patient presents for radioactive seed localization prior to left breast lumpectomy and right breast excisional biopsy. I met with the patient and we discussed the procedure of seed localization including benefits and alternatives. We discussed the high likelihood of a successful procedure. We discussed  the risks of the procedure including infection, bleeding, tissue injury and further surgery. We discussed the low dose of radioactivity involved in the procedure. Informed, written consent was given. The usual time-out protocol was performed immediately prior to the procedure. Using mammographic guidance, sterile technique, 1% lidocaine and an I-125 radioactive seed, the area of distortion in the right breast which is just superior to the ribbon shaped biopsy marking clip was localized using a lateral approach. The follow-up mammogram images confirm the seed in the expected location. Follow-up survey of the patient confirms presence of the radioactive seed. Order number of I-125 seed:  [id number]. Total activity:  9.629 millicuries  Reference Date: 06/26/2017 Using mammographic guidance, sterile technique, 1% lidocaine and an I-125 radioactive seed, the mass with the coil shaped biopsy marking clip was localized using a lateral approach. The follow-up mammogram images confirm the seed in the expected location. Follow-up survey of the patient confirms presence of the radioactive seed. Order number of I-125 seed:  [id number]. Total activity:  0.102 millicuries  Reference Date: 07/03/2017 The patient tolerated the procedure well and was  [facility]. She was given instructions regarding seed removal. IMPRESSION: Radioactive seed localization bilateral breasts. No apparent complications. Electronically Signed   By: Frederico Hamman M.D.   On: 07/15/2017 13:54    ELIGIBLE FOR AVAILABLE RESEARCH PROTOCOL: no  ASSESSMENT: 81 y.o. [place], [place] woman status post bilateral lumpectomies and left sentinel lymph node sampling 07/16/2017 showing   (a) a right upper outer quadrant pT1a pNX, stage IA invasive ductal carcinoma, grade 1, estrogen and progesterone receptor positive, HER-2 not amplified, with an MIB-1 of 5%   (b) a left central pT1c pN0, stage IA invasive ductal carcinoma, grade 1, estrogen  and progesterone receptor positive, HER-2 not amplified, with an MIB-1 of 5%.  (1) tamoxifen started 07/30/2017  (2) adjuvant radiation pending   PLAN: We spent the better part of today's hour-long appointment discussing the biology of her diagnosis and the specifics of her situation. We first reviewed the fact that cancer is not one disease but more than 100 different diseases and that it is important to keep them separate-- otherwise when friends and relatives discuss their own cancer experiences with [name] confusion can result. Similarly we explained that if breast cancer spreads to the bone or liver, the patient would not have bone cancer or liver cancer, but breast cancer in the bone and breast cancer in the liver: one cancer in three places-- not 3 different cancers which otherwise would have to be treated in 3 different ways.  We discussed the difference between local and systemic therapy. In terms of loco-regional treatment, lumpectomy plus radiation is equivalent to mastectomy as far as survival is concerned. Lumpectomy alone is not equivalent to mastectomy and given [name] excellent functional status, my recommendation is that she receive adjuvant radiation. She is agreeable to this.  We then discussed the rationale for systemic therapy. There is some risk that this cancer may have already spread to other parts of her body. The chance of that in her case is not high, but it certainly not 0. I would put it in the 20% range. This is why she needs treatment that if there is microscopic disease present elsewhere would address that.  Next we went over the options for systemic therapy which are anti-estrogens, anti-HER-2 immunotherapy, and chemotherapy. [name] does not meet criteria for anti-HER-2 immunotherapy. She is a good candidate for anti-estrogens.  The question of chemotherapy is more complicated. Chemotherapy is most effective in rapidly growing, aggressive tumors. It is much less  effective or not effective at all in low-grade, slow growing cancers, like [name] 's. We discussed [name] situation in the multidisciplinary breast conference today and felt that chemotherapy could safely be avoided in her case. She was very encouraged by this decision.  Finally [name] does qualify for genetics testing. In patients who carry a deleterious mutation [for example in a  BRCA gene], the risk of a new breast cancer developing in the future may be sufficiently great that the patient may choose bilateral mastectomies. However if she wishes to keep her breasts in that situation it is safe to do so. That would require intensified screening, which generally means not only yearly mammography but a yearly breast MRI as well. Of course, if there is a deleterious mutation bilateral oophorectomy would be necessary as there is no standard screening protocol for ovarian cancer.  [name] is interested in genetics testing and I will go ahead and set her up for that  Finally, she continues on estradiol vaginal cream. She understands  that her cancer is feeding off estrogen and that this is generally contraindicated in someone with her diagnosis. On the other hand, if she does choose to take tamoxifen as opposed to anastrozole, she may in my opinion safely continue to use estradiol vaginal cream, because tamoxifen blocks the estrogen receptor and is effective even in premenopausal women for example, who will make quite a bit more estrogen then [name] is going to absorb through the vaginal wall  She was able to understand this. She would like to continue Estrace and therefore would like to start tamoxifen now. I went ahead and placed the prescription in for her after discussing the possible toxicities, side effects and complications of this agent, which I gave to her in writing. She particularly understands the risk of blood clots and endometrial carcinoma  She will proceed to radiation. She will see me again in  approximately 6-8 weeks. If she is tolerating tamoxifen well the plan will be to continue that for total of 5 years  [name] has a good understanding of the overall plan. She agrees with it. She knows the goal of treatment in her case is cure. She will call with any problems that may develop before her next visit here.  Chauncey Cruel, MD   07/30/2017 4:57 PM Medical Oncology and Hematology [facility] [address] [place], [place] [address] Tel. [phone]    Fax. [phone]

## 2017-07-31 ENCOUNTER — Telehealth: Payer: Self-pay | Admitting: Oncology

## 2017-07-31 NOTE — Telephone Encounter (Signed)
Scheduled appt per  9/13 los - patient is aware of appt date and time.

## 2017-08-01 NOTE — Progress Notes (Signed)
Radiation Oncology         [phone] ________________________________  Initial outpatient Consultation  Name: [name] MRN: [id number]  Date: 07/30/2017  DOB: 05-27-1936  HD:QQIW, Hunt Oris, MD  Erroll Luna, MD   REFERRING PHYSICIAN: Erroll Luna, MD  DIAGNOSIS:    ICD-10-CM   1. Malignant neoplasm of central portion of left breast ([place]) C50.112   2. Malignant neoplasm of central portion of left breast in female, estrogen receptor positive ([place]) C50.112    Z17.0   3. Malignant neoplasm of upper-outer quadrant of right breast in female, estrogen receptor positive ([place]) C50.411    Z17.0    Stage IA bilateral breast cancers:  Right Breast UOQ Invasive Ductal Carcinoma, ER+ / PR+ / Her2neg, Grade 1 Left Breast Central Invasive Ductal Carcinoma, ER+ / PR+ / Her2neg, Grade 1  CHIEF COMPLAINT: Here to discuss management of bilateral breast cancer  HISTORY OF PRESENT ILLNESS::[name] is a 81 y.o. female who presented with abnormalities on mammography bilaterally.  Subsequent [place] of right breast showed a 38m lesion at 10:00, left breast [place] negative. Axillae were negative on [place] well.    Biopsy showed 1. Breast, left, needle core biopsy, upper outer - INVASIVE MAMMARY CARCINOMA; ER + / PR +. 2. Breast, right, needle core biopsy, 10:00 o'clock - FIBROCYSTIC CHANGE. - NO MALIGNANCY IDENTIFIED.  Bilateral lumpectomies were performed nonetheless on 07-16-17. A SLN was removed from the left side only. Diagnosis 1. Breast, lumpectomy, Right w/seed - INVASIVE DUCTAL CARCINOMA, GRADE I/III, SPANNING 0.4 CM - DUCTAL CARCINOMA IN SITU, LOW GRADE. - THE SURGICAL RESECTION MARGINS ARE NEGATIVE FOR CARCINOMA. - SEE ONCOLOGY TABLE BELOW. 2. Breast, lumpectomy, Left w/seed - INVASIVE DUCTAL CARCINOMA, GRADE I/III, SPANNING 1.6 CM. - THE SURGICAL RESECTION MARGINS ARE NEGATIVE FOR CARCINOMA. - SEE ONCOLOGY TABLE BELOW. 3. Lymph node, sentinel, biopsy, Left Axillary - THERE IS  NO EVIDENCE OF CARCINOMA IN 1 OF 1 LYMPH NODE (0/1).  Margins are clear. Both tumors are ER+ PR + and HER2 neg.  She is in her USOH, here with her daughter, a breast cancer survivor.   PREVIOUS RADIATION THERAPY: No  PAST MEDICAL HISTORY:  has a past medical history of Breast cancer, left ([facility] [place] (06/2017); Depression; Glaucoma; High cholesterol; HYPERTENSION; Impaired hearing; Osteoarthritis; Recurrent UTI; Restless leg syndrome; and Seasonal allergies.    PAST SURGICAL HISTORY: Past Surgical History:  Procedure Laterality Date  . ANTERIOR CERVICAL DECOMP/DISCECTOMY FUSION  07/30/2011   C4-5, C5-6, C6-7  . BREAST LUMPECTOMY WITH RADIOACTIVE SEED AND SENTINEL LYMPH NODE BIOPSY Left 07/16/2017   Procedure: LEFT BREAST LUMPECTOMY WITH RADIOACTIVE SEED AND SENTINEL LYMPH NODE BIOPSY;  Surgeon: CErroll Luna MD;  [place] [facility]  Service: General;  Laterality: Left;  . BREAST LUMPECTOMY WITH RADIOACTIVE SEED LOCALIZATION Right 07/16/2017   Procedure: RIGHT BREAST LUMPECTOMY WITH RADIOACTIVE SEED LOCALIZATION;  Surgeon: CErroll Luna MD;  [place] [facility]  Service: General;  Laterality: Right;  . BREAST SURGERY Left 04/10/2007   exc. calcifications  . CATARACT EXTRACTION W/ INTRAOCULAR LENS  IMPLANT, BILATERAL    . CERVICAL FUSION  2011  . COLONOSCOPY WITH PROPOFOL  12/27/2013  . DIAGNOSTIC LAPAROSCOPY Right 08/21/2005   drainage and marsupialization of hepatic cyst  . FOOT SURGERY Right 1998  . KNEE ARTHROSCOPY Left 2002  . LUMBAR FUSION Left 02/12/2011   L3-4  . TOTAL KNEE ARTHROPLASTY Right 11/15/2016   Procedure: RIGHT TOTAL KNEE ARTHROPLASTY;  Surgeon: SNetta Cedars MD;  Location: [facility];  Service: Orthopedics;  Laterality: Right;  . TUBAL LIGATION  1970    FAMILY HISTORY: family history includes Alzheimer's disease in her mother and sister; Cancer in her father; Hypertension in her mother; Parkinsonism in her mother.  SOCIAL HISTORY:   reports that she has never smoked. She has never used smokeless tobacco. She reports that she does not drink alcohol or use drugs.  ALLERGIES: Codeine; Hydromorphone; Moxifloxacin; Oxycodone; Penicillins; and Quinine derivatives  MEDICATIONS:  Current Outpatient Prescriptions  Medication Sig Dispense Refill  . acetaminophen (TYLENOL) 325 MG tablet Take 650 mg by mouth every 6 (six) hours as needed.    . ALPRAZolam (XANAX) 0.25 MG tablet Take 0.25 mg by mouth at bedtime as needed for anxiety.    Marland Kitchen amLODipine (NORVASC) 2.5 MG tablet Take 2.5 mg by mouth daily.    Marland Kitchen aspirin EC 81 MG tablet Take 81 mg by mouth daily.    . clobetasol cream (TEMOVATE) 8.89 % Apply 1 application topically 2 (two) times daily.    Marland Kitchen CRANBERRY PO Take 2 tablets by mouth 2 (two) times daily.    Marland Kitchen EPIPEN 2-PAK 0.3 MG/0.3ML SOAJ injection Inject 0.3 mg into the skin as needed (allergic reaction).   12  . estradiol (ESTRACE) 0.1 MG/GM vaginal cream Place 1 Applicatorful vaginally 2 (two) times a week.    . gabapentin (NEURONTIN) 300 MG capsule Take 300 mg by mouth 2 (two) times daily.    Marland Kitchen gentamicin ointment (GARAMYCIN) 0.1 % Apply 1 application topically 3 (three) times daily. INTRANASALLY    . hydrocortisone (ANUSOL-HC) 25 MG suppository Place 25 mg rectally 2 (two) times daily as needed.     . latanoprost (XALATAN) 0.005 % ophthalmic solution Place 1 drop into both eyes at bedtime.    . Multiple Vitamin (MULTIVITAMIN) tablet Take 1 tablet by mouth daily.    . naproxen (NAPROSYN) 375 MG tablet Take 1 tablet (375 mg total) by mouth 2 (two) times daily with a meal. (Patient not taking: Reported on 07/30/2017) 30 tablet 2  . nitrofurantoin (MACRODANTIN) 100 MG capsule Take 100 mg by mouth 4 (four) times daily.    Marland Kitchen nystatin cream (MYCOSTATIN) Apply 1 application topically as needed for dry skin.    . pravastatin (PRAVACHOL) 40 MG tablet Take 40 mg by mouth daily.    . sertraline (ZOLOFT) 100 MG tablet Take 100 mg by mouth 2  (two) times daily.    Marland Kitchen terconazole (TERAZOL 7) 0.4 % vaginal cream Place 1 applicator vaginally daily as needed (discomfort).    . timolol (BETIMOL) 0.5 % ophthalmic solution Place 1 drop into both eyes daily.    . traMADol (ULTRAM) 50 MG tablet Take 1 tablet (50 mg total) by mouth every 6 (six) hours as needed. 15 tablet 0  . Vitamins-Lipotropics (LIPOFLAVONOID PO) Take 2 tablets by mouth 2 (two) times daily.    Marland Kitchen azithromycin (ZITHROMAX) 250 MG tablet azithromycin 250 mg tablet    . fluconazole (DIFLUCAN) 100 MG tablet fluconazole 100 mg tablet    . gentamicin ointment (GARAMYCIN) 0.1 % gentamicin sulfate 0.1 % oint    . hydrochlorothiazide (HYDRODIURIL) 25 MG tablet hydrochlorothiazide 25 mg tablet    . hydrocortisone (PROCTOZONE-HC) 2.5 % rectal cream proctozone-hc 2.5 % crea    . nystatin-triamcinolone (MYCOLOG II) cream nystatin-triamcinolone 100,000 unit/g-0.1 % topical cream    . sertraline (ZOLOFT) 100 MG tablet sertraline hcl 100 mg tabs    . tamoxifen (NOLVADEX) 20 MG tablet Take 1  tablet (20 mg total) by mouth daily. 90 tablet 12   No current facility-administered medications for this encounter.     REVIEW OF SYSTEMS: A 10+ POINT REVIEW OF SYSTEMS WAS OBTAINED including neurology, dermatology, psychiatry, cardiac, respiratory, lymph, extremities, GI, GU, Musculoskeletal, constitutional, breasts, reproductive, HEENT.  All pertinent positives are noted in the HPI.  All others are negative.   PHYSICAL EXAM:  weight is 191 lb 2 oz (86.7 kg). Her oral temperature is 97.8 F (36.6 C). Her blood pressure is 151/58 (abnormal) and her pulse is 63. Her respiration is 20 and oxygen saturation is 98%.   General: Alert and oriented, in no acute distress HEENT: Head is normocephalic. Extraocular movements are intact. Oropharynx is clear. Neck: Neck is supple, no palpable cervical or supraclavicular lymphadenopathy. Heart: Regular in rate and rhythm with a murmur loudest at pulmonary valve.   Patient informed, she knows to notify PCP. Chest: Clear to auscultation bilaterally, with no rhonchi, wheezes, or rales. Abdomen: Soft, nontender, nondistended, with no rigidity or guarding. Extremities: No cyanosis or edema in UE. Lymphatics: see Neck Exam Skin: No concerning lesions. Musculoskeletal: symmetric strength and muscle tone throughout. Neurologic: Cranial nerves II through XII are grossly intact. No obvious focalities. Speech is fluent. Coordination is intact. Psychiatric: Judgment and insight are intact. Affect is appropriate. Breasts: healing well from surgery, scars without oozing    ECOG = 0  0 - Asymptomatic (Fully active, able to carry on all predisease activities without restriction)  1 - Symptomatic but completely ambulatory (Restricted in physically strenuous activity but ambulatory and able to carry out work of a light or sedentary nature. For example, light housework, office work)  2 - Symptomatic, <50% in bed during the day (Ambulatory and capable of all self care but unable to carry out any work activities. Up and about more than 50% of waking hours)  3 - Symptomatic, >50% in bed, but not bedbound (Capable of only limited self-care, confined to bed or chair 50% or more of waking hours)  4 - Bedbound (Completely disabled. Cannot carry on any self-care. Totally confined to bed or chair)  5 - Death   Eustace Pen MM, Creech RH, Tormey DC, et al. [phone]). "Toxicity and response criteria of the [facility]". [place] Oncol. 5 (6): 649-55   LABORATORY DATA:  Lab Results  Component Value Date   WBC 8.8 07/30/2017   HGB 12.8 07/30/2017   HCT 38.7 07/30/2017   MCV 94.3 07/30/2017   PLT 203 07/30/2017   CMP     Component Value Date/Time   NA 142 07/30/2017 1527   K 4.0 07/30/2017 1527   CL 104 07/15/2017 1008   CO2 27 07/30/2017 1527   GLUCOSE 87 07/30/2017 1527   GLUCOSE 88 09/09/2006 1613   BUN 20.6 07/30/2017 1527   CREATININE 0.8  07/30/2017 1527   CALCIUM 9.4 07/30/2017 1527   PROT 7.8 07/30/2017 1527   ALBUMIN 3.9 07/30/2017 1527   AST 21 07/30/2017 1527   ALT 16 07/30/2017 1527   ALKPHOS 76 07/30/2017 1527   BILITOT 0.34 07/30/2017 1527   GFRNONAA >60 07/15/2017 1008   GFRAA >60 07/15/2017 1008         RADIOGRAPHY: Mm Breast Surgical Specimen  Result Date: 07/16/2017 CLINICAL DATA:  Evaluate right specimen EXAM: SPECIMEN RADIOGRAPH OF THE RIGHT BREAST COMPARISON:  Previous exam(s). FINDINGS: Status post excision of the right breast. The radioactive seed and biopsy marker clip are present, completely intact, and were  marked for pathology. IMPRESSION: Specimen radiograph of the right breast. Electronically Signed   By: Dorise Bullion III M.D   On: 07/16/2017 09:29   Mm Breast Surgical Specimen  Result Date: 07/16/2017 CLINICAL DATA:  Evaluate left specimen EXAM: SPECIMEN RADIOGRAPH OF THE LEFT BREAST COMPARISON:  Previous exam(s). FINDINGS: Status post excision of the left breast. The radioactive seed and biopsy marker clip are present, completely intact, and were marked for pathology. IMPRESSION: Specimen radiograph of the left breast. Electronically Signed   By: Dorise Bullion III M.D   On: 07/16/2017 09:29   Mm Lt Radioactive Seed Loc Mammo Guide  Result Date: 07/15/2017 CLINICAL DATA:  81 year old female presenting for radioactive seed localization of the bilateral breasts. EXAM: MAMMOGRAPHIC GUIDED RADIOACTIVE SEED LOCALIZATION OF THE BILATERAL BREAST COMPARISON:  Previous exam(s). FINDINGS: Patient presents for radioactive seed localization prior to left breast lumpectomy and right breast excisional biopsy. I met with the patient and we discussed the procedure of seed localization including benefits and alternatives. We discussed the high likelihood of a successful procedure. We discussed the risks of the procedure including infection, bleeding, tissue injury and further surgery. We discussed the low dose of  radioactivity involved in the procedure. Informed, written consent was given. The usual time-out protocol was performed immediately prior to the procedure. Using mammographic guidance, sterile technique, 1% lidocaine and an I-125 radioactive seed, the area of distortion in the right breast which is just superior to the ribbon shaped biopsy marking clip was localized using a lateral approach. The follow-up mammogram images confirm the seed in the expected location. Follow-up survey of the patient confirms presence of the radioactive seed. Order number of I-125 seed:  [id number]. Total activity:  9.935 millicuries  Reference Date: 06/26/2017 Using mammographic guidance, sterile technique, 1% lidocaine and an I-125 radioactive seed, the mass with the coil shaped biopsy marking clip was localized using a lateral approach. The follow-up mammogram images confirm the seed in the expected location. Follow-up survey of the patient confirms presence of the radioactive seed. Order number of I-125 seed:  [id number]. Total activity:  3.009 millicuries  Reference Date: 07/03/2017 The patient tolerated the procedure well and was released from the [facility]. She was given instructions regarding seed removal. IMPRESSION: Radioactive seed localization bilateral breasts. No apparent complications. Electronically Signed   By: Ammie Ferrier M.D.   On: 07/15/2017 13:54   Mm Rt Radioactive Seed Loc Mammo Guide  Result Date: 07/15/2017 CLINICAL DATA:  81 year old female presenting for radioactive seed localization of the bilateral breasts. EXAM: MAMMOGRAPHIC GUIDED RADIOACTIVE SEED LOCALIZATION OF THE BILATERAL BREAST COMPARISON:  Previous exam(s). FINDINGS: Patient presents for radioactive seed localization prior to left breast lumpectomy and right breast excisional biopsy. I met with the patient and we discussed the procedure of seed localization including benefits and alternatives. We discussed the high likelihood of a  successful procedure. We discussed the risks of the procedure including infection, bleeding, tissue injury and further surgery. We discussed the low dose of radioactivity involved in the procedure. Informed, written consent was given. The usual time-out protocol was performed immediately prior to the procedure. Using mammographic guidance, sterile technique, 1% lidocaine and an I-125 radioactive seed, the area of distortion in the right breast which is just superior to the ribbon shaped biopsy marking clip was localized using a lateral approach. The follow-up mammogram images confirm the seed in the expected location. Follow-up survey of the patient confirms presence of the radioactive seed. Order number of I-125 seed:  [id number]. Total activity:  8.453 millicuries  Reference Date: 06/26/2017 Using mammographic guidance, sterile technique, 1% lidocaine and an I-125 radioactive seed, the mass with the coil shaped biopsy marking clip was localized using a lateral approach. The follow-up mammogram images confirm the seed in the expected location. Follow-up survey of the patient confirms presence of the radioactive seed. Order number of I-125 seed:  [id number]. Total activity:  2.482 millicuries  Reference Date: 07/03/2017 The patient tolerated the procedure well and was released from the [facility]. She was given instructions regarding seed removal. IMPRESSION: Radioactive seed localization bilateral breasts. No apparent complications. Electronically Signed   By: Ammie Ferrier M.D.   On: 07/15/2017 13:54      IMPRESSION/PLAN: bilateral stage I breast cancers  She has been discussed at our multidisciplinary tumor board.     For the patient's early stage favorable risk breast cancers, we had a thorough discussion about her options for adjuvant therapy. One option would be antiestrogen therapy as discussed with medical oncology. She would take a pill for approximately 5 years. The alternative option (but less  standard) would be radiotherapy to the breast. The most aggressive option would be to pursue both modalities. Essentially, radiotherapy is optional, with modest potential benefits.  Of note, I discussed the data from the [employer] al trial in the [place] of [employer]. She understands that tamoxifen compared to radiation plus tamoxifen demonstrated no survival benefit among the women in this study. The women were 67 years or older with stage I estrogen receptor positive breast cancer. Based on this study, I told the patient that her overall life expectancy should not be affected by adding radiotherapy to antiestrogen medication. She understands that the main benefit of  adding radiotherapy to anti estrogen therapy would be a very small but measurable local control benefit (risk of local recurrence to be lowered from ~10% --> ~2% over a decade for each breast).  We discussed the fact that radiotherapy only provides a local control benefit while anti-estrogen pills provide systemic coverage. That being said, the risk of systemic failure is relatively low with her type of breast cancer.  She would like to pursue radiotherapy.  She will see med/onc today to discuss anti estrogens as well  It was a pleasure meeting the patient today. We discussed the risks, benefits, and side effects of radiotherapy. I recommend radiotherapy to the  Breasts  to reduce her risk of locoregional recurrence by 2/3.  We discussed that radiation would take approximately 3.5 weeks to complete and that I would give the patient at least another week or two to heal following surgery before starting treatment planning. We spoke about acute effects including skin irritation and fatigue as well as much less common late effects including internal organ injury or irritation. We spoke about the latest technology that is used to minimize the risk of late effects for patients undergoing radiotherapy to the breast or chest wall. No  guarantees of treatment were given. The patient is enthusiastic about proceeding with treatment. I look forward to participating in the patient's care. I have ordered an appt for CT simulation/treatment planning.  I noted that Dr Jana Hakim is referring her to genetics giver her personal and family history. I concur. __________________________________________   Eppie Gibson, MD

## 2017-08-10 ENCOUNTER — Other Ambulatory Visit: Payer: Self-pay | Admitting: Internal Medicine

## 2017-08-12 ENCOUNTER — Ambulatory Visit
Admission: RE | Admit: 2017-08-12 | Discharge: 2017-08-12 | Disposition: A | Discharge status: Home / Self Care | Payer: Medicare Other | Source: Ambulatory Visit | Attending: Radiation Oncology | Admitting: Radiation Oncology

## 2017-08-12 DIAGNOSIS — C50112 Malignant neoplasm of central portion of left female breast: Secondary | ICD-10-CM

## 2017-08-12 DIAGNOSIS — C50411 Malignant neoplasm of upper-outer quadrant of right female breast: Secondary | ICD-10-CM

## 2017-08-12 DIAGNOSIS — Z510 Encounter for antineoplastic radiation therapy: Secondary | ICD-10-CM | POA: Diagnosis not present

## 2017-08-12 DIAGNOSIS — Z170 Estrogen receptor positive status [ER+]: Principal | ICD-10-CM

## 2017-08-12 NOTE — Progress Notes (Signed)
  Radiation Oncology         [phone] ________________________________  Name: [name] MRN: [id number]  Date: 08/12/2017  DOB: [birth year]  SIMULATION AND TREATMENT PLANNING NOTE    Outpatient  DIAGNOSIS:     ICD-10-CM   1. Malignant neoplasm of upper-outer quadrant of right breast in female, estrogen receptor positive ([place]) C50.411    Z17.0   2. Malignant neoplasm of central portion of left breast in female, estrogen receptor positive ([place]) C50.112    Z17.0    NARRATIVE:  The patient was brought to the [facility].  Identity was confirmed.  All relevant records and images related to the planned course of therapy were reviewed.  The patient freely provided informed written consent to proceed with treatment after reviewing the details related to the planned course of therapy. The consent form was witnessed and verified by the simulation staff.    Then, the patient was set-up in a stable reproducible supine position for radiation therapy with her ipsilateral arm over her head, and her upper body secured in a custom-made Vac-lok device.  CT images were obtained.  Surface markings were placed.  The CT images were loaded into the planning software.    TREATMENT PLANNING NOTE: Treatment planning then occurred.  The radiation prescription was entered and confirmed.     A total of 5 medically necessary complex treatment devices were fabricated and supervised by me: 4 fields with MLCs for custom blocks to protect heart, and lungs;  and, a Vac-lok. MORE COMPLEX DEVICES MAY BE MADE IN DOSIMETRY FOR FIELD IN FIELD BEAMS FOR DOSE HOMOGENEITY.  I have requested : 3D Simulation which is medically necessary to give adequate dose to at risk tissues while sparing lungs and heart.  I have requested a DVH of the following structures: lungs, heart, bilateral lumpectomy cavities.    The patient will receive 42.56 Gy in 16 fractions to the bilateral breasts with 4 fields.  This will not be  followed by a boost.  Optical Surface Tracking Plan:  Since intensity modulated radiotherapy (IMRT) and 3D conformal radiation treatment methods are predicated on accurate and precise positioning for treatment, intrafraction motion monitoring is medically necessary to ensure accurate and safe treatment delivery. The ability to quantify intrafraction motion without excessive ionizing radiation dose can only be performed with optical surface tracking. Accordingly, surface imaging offers the opportunity to obtain 3D measurements of patient position throughout IMRT and 3D treatments without excessive radiation exposure. I am ordering optical surface tracking for this patient's upcoming course of radiotherapy.  ________________________________   Reference:  Ursula Alert, J, et al. Surface imaging-based analysis of intrafraction motion for breast radiotherapy patients.Journal of [place], n. 6, nov. 2014. ISSN [id number].  Available at: <http://www.jacmp.org/index.php/jacmp/article/view/4957>.    -----------------------------------  Eppie Gibson, MD

## 2017-08-15 DIAGNOSIS — Z510 Encounter for antineoplastic radiation therapy: Secondary | ICD-10-CM | POA: Diagnosis not present

## 2017-08-19 ENCOUNTER — Ambulatory Visit
Admission: RE | Admit: 2017-08-19 | Discharge: 2017-08-19 | Disposition: A | Discharge status: Home / Self Care | Payer: Medicare Other | Source: Ambulatory Visit | Attending: Radiation Oncology | Admitting: Radiation Oncology

## 2017-08-19 DIAGNOSIS — Z510 Encounter for antineoplastic radiation therapy: Secondary | ICD-10-CM | POA: Diagnosis not present

## 2017-08-20 ENCOUNTER — Ambulatory Visit
Admission: RE | Admit: 2017-08-20 | Discharge: 2017-08-20 | Disposition: A | Discharge status: Home / Self Care | Payer: Medicare Other | Source: Ambulatory Visit | Attending: Radiation Oncology | Admitting: Radiation Oncology

## 2017-08-20 DIAGNOSIS — Z510 Encounter for antineoplastic radiation therapy: Secondary | ICD-10-CM | POA: Diagnosis not present

## 2017-08-21 ENCOUNTER — Ambulatory Visit
Admission: RE | Admit: 2017-08-21 | Discharge: 2017-08-21 | Disposition: A | Discharge status: Home / Self Care | Payer: Medicare Other | Source: Ambulatory Visit | Attending: Radiation Oncology | Admitting: Radiation Oncology

## 2017-08-21 DIAGNOSIS — Z510 Encounter for antineoplastic radiation therapy: Secondary | ICD-10-CM | POA: Diagnosis not present

## 2017-08-21 DIAGNOSIS — Z170 Estrogen receptor positive status [ER+]: Principal | ICD-10-CM

## 2017-08-21 DIAGNOSIS — C50411 Malignant neoplasm of upper-outer quadrant of right female breast: Secondary | ICD-10-CM

## 2017-08-21 MED ORDER — ALRA NON-METALLIC DEODORANT (RAD-ONC)
1.0000 "application " | Freq: Once | TOPICAL | Status: AC
  Administered 2017-08-21: 1 via TOPICAL

## 2017-08-21 MED ORDER — RADIAPLEXRX EX GEL
Freq: Once | CUTANEOUS | Status: AC
  Administered 2017-08-21: 14:00:00 via TOPICAL

## 2017-08-21 NOTE — Progress Notes (Signed)

## 2017-08-22 ENCOUNTER — Ambulatory Visit
Admission: RE | Admit: 2017-08-22 | Discharge: 2017-08-22 | Disposition: A | Discharge status: Home / Self Care | Payer: Medicare Other | Source: Ambulatory Visit | Attending: Radiation Oncology | Admitting: Radiation Oncology

## 2017-08-22 DIAGNOSIS — Z510 Encounter for antineoplastic radiation therapy: Secondary | ICD-10-CM | POA: Diagnosis not present

## 2017-08-25 ENCOUNTER — Ambulatory Visit
Admission: RE | Admit: 2017-08-25 | Discharge: 2017-08-25 | Disposition: A | Discharge status: Home / Self Care | Payer: Medicare Other | Source: Ambulatory Visit | Attending: Radiation Oncology | Admitting: Radiation Oncology

## 2017-08-25 DIAGNOSIS — Z510 Encounter for antineoplastic radiation therapy: Secondary | ICD-10-CM | POA: Diagnosis not present

## 2017-08-26 ENCOUNTER — Ambulatory Visit
Admission: RE | Admit: 2017-08-26 | Discharge: 2017-08-26 | Disposition: A | Discharge status: Home / Self Care | Payer: Medicare Other | Source: Ambulatory Visit | Attending: Radiation Oncology | Admitting: Radiation Oncology

## 2017-08-26 DIAGNOSIS — Z510 Encounter for antineoplastic radiation therapy: Secondary | ICD-10-CM | POA: Diagnosis not present

## 2017-08-27 ENCOUNTER — Encounter: Payer: Self-pay | Admitting: Internal Medicine

## 2017-08-27 ENCOUNTER — Ambulatory Visit (INDEPENDENT_AMBULATORY_CARE_PROVIDER_SITE_OTHER): Payer: Medicare Other | Admitting: Internal Medicine

## 2017-08-27 ENCOUNTER — Ambulatory Visit
Admission: RE | Admit: 2017-08-27 | Discharge: 2017-08-27 | Disposition: A | Discharge status: Home / Self Care | Payer: Medicare Other | Source: Ambulatory Visit | Attending: Radiation Oncology | Admitting: Radiation Oncology

## 2017-08-27 VITALS — BP 112/72 | HR 65 | Temp 97.9°F | Ht 65.0 in | Wt 185.0 lb

## 2017-08-27 DIAGNOSIS — Z23 Encounter for immunization: Secondary | ICD-10-CM | POA: Diagnosis not present

## 2017-08-27 DIAGNOSIS — R7302 Impaired glucose tolerance (oral): Secondary | ICD-10-CM | POA: Diagnosis not present

## 2017-08-27 DIAGNOSIS — I10 Essential (primary) hypertension: Secondary | ICD-10-CM

## 2017-08-27 DIAGNOSIS — E785 Hyperlipidemia, unspecified: Secondary | ICD-10-CM

## 2017-08-27 DIAGNOSIS — Z510 Encounter for antineoplastic radiation therapy: Secondary | ICD-10-CM | POA: Diagnosis not present

## 2017-08-27 MED ORDER — GABAPENTIN 300 MG PO CAPS: ORAL_CAPSULE | ORAL | 1 refills | Status: AC

## 2017-08-27 MED ORDER — ZOSTER VAC RECOMB ADJUVANTED 50 MCG/0.5ML IM SUSR: 0.5000 mL | Freq: Once | INTRAMUSCULAR | 1 refills | Status: AC

## 2017-08-27 MED ORDER — ALPRAZOLAM 0.25 MG PO TABS: 0.2500 mg | ORAL_TABLET | Freq: Every evening | ORAL | 1 refills | Status: AC | PRN

## 2017-08-27 MED ORDER — SERTRALINE HCL 100 MG PO TABS: ORAL_TABLET | ORAL | 3 refills | Status: AC

## 2017-08-27 MED ORDER — PRAVASTATIN SODIUM 40 MG PO TABS: 40.0000 mg | ORAL_TABLET | Freq: Every day | ORAL | 3 refills | Status: AC

## 2017-08-27 MED ORDER — FLUCONAZOLE 100 MG PO TABS: ORAL_TABLET | ORAL | 0 refills | Status: AC

## 2017-08-27 MED ORDER — AMLODIPINE BESYLATE 2.5 MG PO TABS: 2.5000 mg | ORAL_TABLET | Freq: Every day | ORAL | 3 refills | Status: AC

## 2017-08-27 NOTE — Patient Instructions (Signed)
OK to try the OTC allegra, nasacort and even mucinex for sinus symptoms  Your shingles shot was sent to the pharmacy, and remember to ask about the cost  Please continue all other medications as before, and refills have been done if requested.  Please have the pharmacy call with any other refills you may need.  Please keep your appointments with your specialists as you may have planned  Please return in 6 months, or sooner if needed, with Lab testing done 3-5 days before

## 2017-08-27 NOTE — Assessment & Plan Note (Signed)
stable overall by history and exam, recent data reviewed with pt, and pt to continue medical treatment as before,  to f/u any worsening symptoms or concerns Lab Results  Component Value Date   LDLCALC 101 (H) 12/19/2016  declines f/u lab

## 2017-08-27 NOTE — Assessment & Plan Note (Signed)
stable overall by history and exam, recent data reviewed with pt, and pt to continue medical treatment as before,  to f/u any worsening symptoms or concerns BP Readings from Last 3 Encounters:  08/27/17 112/72  07/30/17 (!) 151/58  07/30/17 (!) 147/70

## 2017-08-27 NOTE — Assessment & Plan Note (Signed)
stable overall by history and exam, recent data reviewed with pt, and pt to continue medical treatment as before,  to f/u any worsening symptoms or concerns Lab Results  Component Value Date   HGBA1C 5.6 12/20/2015

## 2017-08-27 NOTE — Progress Notes (Signed)
Subjective:    Patient ID: [name], female    DOB: May 10, 1936, 81 y.o.   MRN: [id number]  HPI  Here to f/u; overall doing ok,  Pt denies chest pain, increasing sob or doe, wheezing, orthopnea, PND, increased LE swelling, palpitations, dizziness or syncope.  Pt denies new neurological symptoms such as new headache, or facial or extremity weakness or numbness.  Pt denies polydipsia, polyuria, or low sugar episode.  Pt states overall good compliance with meds, mostly trying to follow appropriate diet, with wt overall stable,  but little exercise however. Does have several wks ongoing nasal allergy symptoms with clearish congestion, itch and sneezing, without fever, pain, ST, cough, swelling or wheezing. Asks for shingrix rx, no other interval hx except has been somewhat down in mood throughout all the stress of her recent oncology tx and XRT, but is doing relatively well after her right knee TKR in jan 2018 Past Medical History:  Diagnosis Date  . Breast cancer, left ([facility]) 06/2017  . Depression   . Glaucoma   . High cholesterol   . HYPERTENSION    states under control with med., has been on med. x 1 yr.  . Impaired hearing   . Osteoarthritis    right knee  . Recurrent UTI   . Restless leg syndrome   . Seasonal allergies    Past Surgical History:  Procedure Laterality Date  . ANTERIOR CERVICAL DECOMP/DISCECTOMY FUSION  07/30/2011   C4-5, C5-6, C6-7  . BREAST LUMPECTOMY WITH RADIOACTIVE SEED AND SENTINEL LYMPH NODE BIOPSY Left 07/16/2017   Procedure: LEFT BREAST LUMPECTOMY WITH RADIOACTIVE SEED AND SENTINEL LYMPH NODE BIOPSY;  Surgeon: Erroll Luna, MD;  Location: [facility];  Service: General;  Laterality: Left;  . BREAST LUMPECTOMY WITH RADIOACTIVE SEED LOCALIZATION Right 07/16/2017   Procedure: RIGHT BREAST LUMPECTOMY WITH RADIOACTIVE SEED LOCALIZATION;  Surgeon: Erroll Luna, MD;  Location: [facility];  Service: General;  Laterality: Right;  .  BREAST SURGERY Left 04/10/2007   exc. calcifications  . CATARACT EXTRACTION W/ INTRAOCULAR LENS  IMPLANT, BILATERAL    . CERVICAL FUSION  2011  . COLONOSCOPY WITH PROPOFOL  12/27/2013  . DIAGNOSTIC LAPAROSCOPY Right 08/21/2005   drainage and marsupialization of hepatic cyst  . FOOT SURGERY Right 1998  . KNEE ARTHROSCOPY Left 2002  . LUMBAR FUSION Left 02/12/2011   L3-4  . TOTAL KNEE ARTHROPLASTY Right 11/15/2016   Procedure: RIGHT TOTAL KNEE ARTHROPLASTY;  Surgeon: Netta Cedars, MD;  Location: [facility];  Service: Orthopedics;  Laterality: Right;  . TUBAL LIGATION  1970    reports that she has never smoked. She has never used smokeless tobacco. She reports that she does not drink alcohol or use drugs. family history includes Alzheimer's disease in her mother and sister; Cancer in her father; Hypertension in her mother; Parkinsonism in her mother. Allergies  Allergen Reactions  . Codeine Nausea And Vomiting  . Hydromorphone Other (See Comments)    OVERSEDATION  . Moxifloxacin Other (See Comments)    YEAST INFECTION  . Oxycodone Other (See Comments)    ALTERED MENTAL STATE  . Penicillins Itching and Other (See Comments)    YEAST INFECTION  . Quinine Derivatives Other (See Comments)    DIZZINESS   Current Outpatient Prescriptions on File Prior to Visit  Medication Sig Dispense Refill  . acetaminophen (TYLENOL) 325 MG tablet Take 650 mg by mouth every 6 (six) hours as needed.    Marland Kitchen aspirin EC 81 MG tablet  Take 81 mg by mouth daily.    . clobetasol cream (TEMOVATE) 1.85 % Apply 1 application topically 2 (two) times daily.    Marland Kitchen CRANBERRY PO Take 2 tablets by mouth 2 (two) times daily.    Marland Kitchen EPIPEN 2-PAK 0.3 MG/0.3ML SOAJ injection Inject 0.3 mg into the skin as needed (allergic reaction).   12  . estradiol (ESTRACE) 0.1 MG/GM vaginal cream Place 1 Applicatorful vaginally 2 (two) times a week.    Marland Kitchen gentamicin ointment (GARAMYCIN) 0.1 % Apply 1 application topically 3 (three) times daily.  INTRANASALLY    . gentamicin ointment (GARAMYCIN) 0.1 % gentamicin sulfate 0.1 % oint    . hydrocortisone (ANUSOL-HC) 25 MG suppository Place 25 mg rectally 2 (two) times daily as needed.     . hydrocortisone (PROCTOZONE-HC) 2.5 % rectal cream proctozone-hc 2.5 % crea    . latanoprost (XALATAN) 0.005 % ophthalmic solution Place 1 drop into both eyes at bedtime.    . Multiple Vitamin (MULTIVITAMIN) tablet Take 1 tablet by mouth daily.    . naproxen (NAPROSYN) 375 MG tablet Take 1 tablet (375 mg total) by mouth 2 (two) times daily with a meal. 30 tablet 2  . nitrofurantoin (MACRODANTIN) 100 MG capsule Take 100 mg by mouth 4 (four) times daily.    Marland Kitchen nystatin cream (MYCOSTATIN) Apply 1 application topically as needed for dry skin.    Marland Kitchen nystatin-triamcinolone (MYCOLOG II) cream nystatin-triamcinolone 100,000 unit/g-0.1 % topical cream    . tamoxifen (NOLVADEX) 20 MG tablet Take 1 tablet (20 mg total) by mouth daily. 90 tablet 12  . terconazole (TERAZOL 7) 0.4 % vaginal cream Place 1 applicator vaginally daily as needed (discomfort).    . timolol (BETIMOL) 0.5 % ophthalmic solution Place 1 drop into both eyes daily.    . traMADol (ULTRAM) 50 MG tablet Take 1 tablet (50 mg total) by mouth every 6 (six) hours as needed. 15 tablet 0  . Vitamins-Lipotropics (LIPOFLAVONOID PO) Take 2 tablets by mouth 2 (two) times daily.     No current facility-administered medications on file prior to visit.    Review of Systems  Constitutional: Negative for other unusual diaphoresis or sweats HENT: Negative for ear discharge or swelling Eyes: Negative for other worsening visual disturbances Respiratory: Negative for stridor or other swelling  Gastrointestinal: Negative for worsening distension or other blood Genitourinary: Negative for retention or other urinary change Musculoskeletal: Negative for other MSK pain or swelling Skin: Negative for color change or other new lesions Neurological: Negative for worsening  tremors and other numbness  Psychiatric/Behavioral: Negative for worsening agitation or other fatigue All other system neg per pt    Objective:   Physical Exam BP 112/72   Pulse 65   Temp 97.9 F (36.6 C) (Oral)   Ht 5\' 5"  (1.651 m)   Wt 185 lb (83.9 kg)   SpO2 98%   BMI 30.79 kg/m  VS noted, not ill appearing Constitutional: Pt appears in NAD HENT: Head: NCAT.  Right Ear: External ear normal.  Left Ear: External ear normal.  Eyes: . Pupils are equal, round, and reactive to light. Conjunctivae and EOM are normal Nose: without d/c or deformity Neck: Neck supple. Gross normal ROM Cardiovascular: Normal rate and regular rhythm.   Pulmonary/Chest: Effort normal and breath sounds without rales or wheezing.  Abd:  Soft, NT, ND, + BS, no organomegaly Neurological: Pt is alert. At baseline orientation, motor grossly intact Skin: Skin is warm. No rashes, other new lesions, no LE edema  Psychiatric: Pt behavior is normal without agitation , appears mild dysphoric No other exam findings Lab Results  Component Value Date   WBC 8.8 07/30/2017   HGB 12.8 07/30/2017   HCT 38.7 07/30/2017   PLT 203 07/30/2017   GLUCOSE 87 07/30/2017   CHOL 174 12/19/2016   TRIG 118.0 12/19/2016   HDL 49.40 12/19/2016   LDLDIRECT 158.1 01/02/2011   LDLCALC 101 (H) 12/19/2016   ALT 16 07/30/2017   AST 21 07/30/2017   NA 142 07/30/2017   K 4.0 07/30/2017   CL 104 07/15/2017   CREATININE 0.8 07/30/2017   BUN 20.6 07/30/2017   CO2 27 07/30/2017   TSH 3.27 12/19/2016   INR 1.60 11/19/2016   HGBA1C 5.6 12/20/2015       Assessment & Plan:

## 2017-08-28 ENCOUNTER — Ambulatory Visit
Admission: RE | Admit: 2017-08-28 | Discharge: 2017-08-28 | Disposition: A | Discharge status: Home / Self Care | Payer: Medicare Other | Source: Ambulatory Visit | Attending: Radiation Oncology | Admitting: Radiation Oncology

## 2017-08-28 DIAGNOSIS — Z510 Encounter for antineoplastic radiation therapy: Secondary | ICD-10-CM | POA: Diagnosis not present

## 2017-08-29 ENCOUNTER — Ambulatory Visit
Admission: RE | Admit: 2017-08-29 | Discharge: 2017-08-29 | Disposition: A | Discharge status: Home / Self Care | Payer: Medicare Other | Source: Ambulatory Visit | Attending: Radiation Oncology | Admitting: Radiation Oncology

## 2017-08-29 DIAGNOSIS — Z510 Encounter for antineoplastic radiation therapy: Secondary | ICD-10-CM | POA: Diagnosis not present

## 2017-09-01 ENCOUNTER — Ambulatory Visit
Admission: RE | Admit: 2017-09-01 | Discharge: 2017-09-01 | Disposition: A | Discharge status: Home / Self Care | Payer: Medicare Other | Source: Ambulatory Visit | Attending: Radiation Oncology | Admitting: Radiation Oncology

## 2017-09-01 DIAGNOSIS — Z510 Encounter for antineoplastic radiation therapy: Secondary | ICD-10-CM | POA: Diagnosis not present

## 2017-09-02 ENCOUNTER — Ambulatory Visit
Admission: RE | Admit: 2017-09-02 | Discharge: 2017-09-02 | Disposition: A | Discharge status: Home / Self Care | Payer: Medicare Other | Source: Ambulatory Visit | Attending: Radiation Oncology | Admitting: Radiation Oncology

## 2017-09-02 DIAGNOSIS — Z510 Encounter for antineoplastic radiation therapy: Secondary | ICD-10-CM | POA: Diagnosis not present

## 2017-09-03 ENCOUNTER — Ambulatory Visit
Admission: RE | Admit: 2017-09-03 | Discharge: 2017-09-03 | Disposition: A | Discharge status: Home / Self Care | Payer: Medicare Other | Source: Ambulatory Visit | Attending: Radiation Oncology | Admitting: Radiation Oncology

## 2017-09-03 DIAGNOSIS — Z510 Encounter for antineoplastic radiation therapy: Secondary | ICD-10-CM | POA: Diagnosis not present

## 2017-09-04 ENCOUNTER — Ambulatory Visit: Admission: RE | Admit: 2017-09-04 | Payer: Medicare Other | Source: Ambulatory Visit

## 2017-09-05 ENCOUNTER — Ambulatory Visit
Admission: RE | Admit: 2017-09-05 | Discharge: 2017-09-05 | Disposition: A | Discharge status: Home / Self Care | Payer: Medicare Other | Source: Ambulatory Visit | Attending: Radiation Oncology | Admitting: Radiation Oncology

## 2017-09-05 DIAGNOSIS — Z510 Encounter for antineoplastic radiation therapy: Secondary | ICD-10-CM | POA: Diagnosis not present

## 2017-09-08 ENCOUNTER — Ambulatory Visit
Admission: RE | Admit: 2017-09-08 | Discharge: 2017-09-08 | Disposition: A | Discharge status: Home / Self Care | Payer: Medicare Other | Source: Ambulatory Visit | Attending: Radiation Oncology | Admitting: Radiation Oncology

## 2017-09-08 DIAGNOSIS — Z170 Estrogen receptor positive status [ER+]: Principal | ICD-10-CM

## 2017-09-08 DIAGNOSIS — C50411 Malignant neoplasm of upper-outer quadrant of right female breast: Secondary | ICD-10-CM

## 2017-09-08 DIAGNOSIS — Z510 Encounter for antineoplastic radiation therapy: Secondary | ICD-10-CM | POA: Diagnosis not present

## 2017-09-08 DIAGNOSIS — C50112 Malignant neoplasm of central portion of left female breast: Secondary | ICD-10-CM

## 2017-09-09 ENCOUNTER — Ambulatory Visit
Admission: RE | Admit: 2017-09-09 | Discharge: 2017-09-09 | Disposition: A | Discharge status: Home / Self Care | Payer: Medicare Other | Source: Ambulatory Visit | Attending: Radiation Oncology | Admitting: Radiation Oncology

## 2017-09-09 DIAGNOSIS — Z510 Encounter for antineoplastic radiation therapy: Secondary | ICD-10-CM | POA: Diagnosis not present

## 2017-09-10 ENCOUNTER — Ambulatory Visit
Admission: RE | Admit: 2017-09-10 | Discharge: 2017-09-10 | Disposition: A | Discharge status: Home / Self Care | Payer: Medicare Other | Source: Ambulatory Visit | Attending: Radiation Oncology | Admitting: Radiation Oncology

## 2017-09-10 ENCOUNTER — Ambulatory Visit: Payer: Medicare Other

## 2017-09-10 DIAGNOSIS — Z510 Encounter for antineoplastic radiation therapy: Secondary | ICD-10-CM | POA: Diagnosis not present

## 2017-09-11 ENCOUNTER — Encounter: Payer: Self-pay | Admitting: Radiation Oncology

## 2017-09-11 ENCOUNTER — Ambulatory Visit
Admission: RE | Admit: 2017-09-11 | Discharge: 2017-09-11 | Disposition: A | Discharge status: Home / Self Care | Payer: Medicare Other | Source: Ambulatory Visit | Attending: Radiation Oncology | Admitting: Radiation Oncology

## 2017-09-11 DIAGNOSIS — Z510 Encounter for antineoplastic radiation therapy: Secondary | ICD-10-CM | POA: Diagnosis not present

## 2017-10-02 NOTE — Progress Notes (Signed)
[place]  Telephone:(336) [phone] Fax:(336) [phone]     ID: [name] DOB: 1936-07-08  MR#: [id number]  DXI#:[id number]  Patient Care Team: Biagio Borg, MD as PCP - General Guadalupe Kerekes, Virgie Dad, MD as Consulting Physician (Oncology) Erroll Luna, MD as Consulting Physician (General Surgery) Eppie Gibson, MD as Attending Physician (Radiation Oncology) Netta Cedars, MD as Consulting Physician (Orthopedic Surgery) Earnie Larsson, MD as Consulting Physician (Neurosurgery) OTHER MD:  CHIEF COMPLAINT: Bilateral estrogen receptor positive breast cancer  CURRENT TREATMENT: Tamoxifen   HISTORY OF CURRENT ILLNESS: From the initial intake note:  The patient had screening mammography July 2018 showing a possible left breast mass and right breast asymmetry. On 06/23/2017 she underwent bilateral diagnostic mammography with tomography and bilateral breast ultrasonography. This found the breast density to be category C. In the left breast there was evidence of prior excisional biopsy. More centrally there was a 1.4 cm irregular mass with architectural distortion. This was not palpable. Ultrasound was not informative. Ultrasound of the left axilla was benign.  On the right, in the upper outer quadrant there was a small focal area of architectural distortion, also not palpable. Ultrasound of the right breast 10:00 position 7 cm from the nipple found a 0.5 cm irregular hypoechoic mass. The right axilla was benign.  Biopsy of the 2 areas in question 06/25/2017, showed, on the left, invasive ductal carcinoma, E-cadherin positive, estrogen receptor 100% positive, progesterone receptor 100% positive, both with strong staining intensity, HER-2 not amplified, with an MIB-1 a 1.29 and the number per cell 1.80. The MIB-1 was 5% (S AA [phone])  On the right biopsy showed fibrocystic change but this was felt to be discordant and excision was recommended.  On 07/16/2017 the patient underwent  bilateral lumpectomies and left axillary sentinel lymph node sampling. On the left there was an invasive ductal carcinoma, grade 1, measuring 1.6 cm. Margins were negative. The single sentinel lymph node was clear.  On the right there was an invasive ductal carcinoma, grade 1, measuring 0.4 cm, with negative margins. This tumor was estrogen receptor 100% positive, progesterone receptor 100% positive, both with strong staining intensity, with an MIB-1 of 5%, and HER-2 nonamplified, with a signals ratio of 1.39 and number per cell 1.95.  The patient's subsequent history is as detailed below.  INTERVAL HISTORY: [name] returns today for follow-up and treatment of her estrogen receptor positive breast cancer. She is accompanied by her daughter. She continues on tamoxifen, which she tolerates well. She experiences mild, intermittent, hot flashes. She denies vaginal discharge.   Since her last visit here she completed her adjuvant radiation. She is doing well, however, she reports fatigue. She has been able to resume normal activity, which includes house and yard work.   REVIEW OF SYSTEMS: [name] is doing well. She has not been exercising and reports that she would like to start walking. The patient had experienced an itchy, erythematous, rash under her left breast which has since improved after using petroleum jelly. She denies unusual headaches, visual changes, nausea, vomiting, or dizziness. There has been no unusual cough, phlegm production, or pleurisy. This been no change in bowel or bladder habits. She denies unexplained fatigue or unexplained weight loss, bleeding, rash, or fever. A detailed review of systems was otherwise entirely stable.    PAST MEDICAL HISTORY: Past Medical History:  Diagnosis Date  . Breast cancer, left ([place]) 06/2017  . Depression   . Glaucoma   . High cholesterol   . HYPERTENSION  states under control with med., has been on med. x 1 yr.  . Impaired hearing   .  Osteoarthritis    right knee  . Recurrent UTI   . Restless leg syndrome   . Seasonal allergies     PAST SURGICAL HISTORY: Past Surgical History:  Procedure Laterality Date  . ANTERIOR CERVICAL DECOMP/DISCECTOMY FUSION  07/30/2011   C4-5, C5-6, C6-7  . BREAST LUMPECTOMY WITH RADIOACTIVE SEED AND SENTINEL LYMPH NODE BIOPSY Left 07/16/2017   Procedure: LEFT BREAST LUMPECTOMY WITH RADIOACTIVE SEED AND SENTINEL LYMPH NODE BIOPSY;  Surgeon: Erroll Luna, MD;  Location: [place];  Service: General;  Laterality: Left;  . BREAST LUMPECTOMY WITH RADIOACTIVE SEED LOCALIZATION Right 07/16/2017   Procedure: RIGHT BREAST LUMPECTOMY WITH RADIOACTIVE SEED LOCALIZATION;  Surgeon: Erroll Luna, MD;  Location: [place];  Service: General;  Laterality: Right;  . BREAST SURGERY Left 04/10/2007   exc. calcifications  . CATARACT EXTRACTION W/ INTRAOCULAR LENS  IMPLANT, BILATERAL    . CERVICAL FUSION  2011  . COLONOSCOPY WITH PROPOFOL  12/27/2013  . DIAGNOSTIC LAPAROSCOPY Right 08/21/2005   drainage and marsupialization of hepatic cyst  . FOOT SURGERY Right 1998  . KNEE ARTHROSCOPY Left 2002  . LUMBAR FUSION Left 02/12/2011   L3-4  . TOTAL KNEE ARTHROPLASTY Right 11/15/2016   Procedure: RIGHT TOTAL KNEE ARTHROPLASTY;  Surgeon: Netta Cedars, MD;  Location: [facility];  Service: Orthopedics;  Laterality: Right;  . TUBAL LIGATION  1970    FAMILY HISTORY Family History  Problem Relation Age of Onset  . Hypertension Mother   . Alzheimer's disease Mother   . Parkinsonism Mother   . Cancer Father        prostate cancer  . Alzheimer's disease Sister   The patient's father died from prostate cancer at the age of 30. The patient's mother died with dementia at the age of 18. The patient had no brothers, 1 sister. One cousin was diagnosed with breast cancer in her 35s. Also the patient's daughter was diagnosed with breast cancer at age 34. The patient's daughter has been  genetically tested on more than one occasion and so far no deleterious mutation has been identified  GYNECOLOGIC HISTORY:  No LMP recorded. Patient is postmenopausal. Menarche age 51, first live birth age 55, the patient is [place] P2. She went through the change of life approximately age 89. She used oral contraceptives briefly, but this was stopped when she had postpartum phlebitis concerns. She never used hormone replacement. 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 She is widowed, lives alone except for her cat. Her children are Jackelyn Poling, who is a Therapist, nutritional (place [employer] her local [employer]) and cells.. Son Shanon Brow lives in [place] and works as a Engineer, maintenance (IT). The patient has 3 grandchildren. She is a Psychologist, forensic.    ADVANCED DIRECTIVES:    HEALTH MAINTENANCE: Social History   Tobacco Use  . Smoking status: Never Smoker  . Smokeless tobacco: Never Used  Substance Use Topics  . Alcohol use: No    Alcohol/week: 0.0 oz  . Drug use: No     Colonoscopy: 12/27/2013/([facility])  PAP:  Bone density: 01/20/2017, T score -1.5   Allergies  Allergen Reactions  . Codeine Nausea And Vomiting  . Hydromorphone Other (See Comments)    OVERSEDATION  .  Moxifloxacin Other (See Comments)    YEAST INFECTION  . Oxycodone Other (See Comments)    ALTERED MENTAL STATE  . Penicillins Itching and Other (See Comments)    YEAST INFECTION  . Quinine Derivatives Other (See Comments)    DIZZINESS    Current Outpatient Medications  Medication Sig Dispense Refill  . acetaminophen (TYLENOL) 325 MG tablet Take 650 mg by mouth every 6 (six) hours as needed.    . ALPRAZolam (XANAX) 0.25 MG tablet Take 1 tablet (0.25 mg total) by mouth at bedtime as needed for anxiety. 90 tablet 1  . amLODipine (NORVASC) 2.5 MG tablet Take 1 tablet (2.5  mg total) by mouth daily. 90 tablet 3  . aspirin EC 81 MG tablet Take 81 mg by mouth daily.    . clobetasol cream (TEMOVATE) 2.35 % Apply 1 application topically 2 (two) times daily.    Marland Kitchen CRANBERRY PO Take 2 tablets by mouth 2 (two) times daily.    Marland Kitchen EPIPEN 2-PAK 0.3 MG/0.3ML SOAJ injection Inject 0.3 mg into the skin as needed (allergic reaction).   12  . estradiol (ESTRACE) 0.1 MG/GM vaginal cream Place 1 Applicatorful vaginally 2 (two) times a week.    . fluconazole (DIFLUCAN) 100 MG tablet 100 mg tablet by mojth daily for 7 days 7 tablet 0  . gabapentin (NEURONTIN) 300 MG capsule 1-2 tab by mouth twice per day as needed 360 capsule 1  . gentamicin ointment (GARAMYCIN) 0.1 % Apply 1 application topically 3 (three) times daily. INTRANASALLY    . gentamicin ointment (GARAMYCIN) 0.1 % gentamicin sulfate 0.1 % oint    . hydrocortisone (ANUSOL-HC) 25 MG suppository Place 25 mg rectally 2 (two) times daily as needed.     . hydrocortisone (PROCTOZONE-HC) 2.5 % rectal cream proctozone-hc 2.5 % crea    . latanoprost (XALATAN) 0.005 % ophthalmic solution Place 1 drop into both eyes at bedtime.    . Multiple Vitamin (MULTIVITAMIN) tablet Take 1 tablet by mouth daily.    . naproxen (NAPROSYN) 375 MG tablet Take 1 tablet (375 mg total) by mouth 2 (two) times daily with a meal. 30 tablet 2  . nitrofurantoin (MACRODANTIN) 100 MG capsule Take 100 mg by mouth 4 (four) times daily.    Marland Kitchen nystatin cream (MYCOSTATIN) Apply 1 application topically as needed for dry skin.    Marland Kitchen nystatin-triamcinolone (MYCOLOG II) cream nystatin-triamcinolone 100,000 unit/g-0.1 % topical cream    . pravastatin (PRAVACHOL) 40 MG tablet Take 1 tablet (40 mg total) by mouth daily. 90 tablet 3  . sertraline (ZOLOFT) 100 MG tablet 2 tab by mouth daily 180 tablet 3  . terconazole (TERAZOL 7) 0.4 % vaginal cream Place 1 applicator vaginally daily as needed (discomfort).    . timolol (BETIMOL) 0.5 % ophthalmic solution Place 1 drop into both  eyes daily.    . traMADol (ULTRAM) 50 MG tablet Take 1 tablet (50 mg total) by mouth every 6 (six) hours as needed. 15 tablet 0  . Vitamins-Lipotropics (LIPOFLAVONOID PO) Take 2 tablets by mouth 2 (two) times daily.     No current facility-administered medications for this visit.     OBJECTIVE: Older white woman in no acute distress  Vitals:   10/08/17 1424  BP: (!) 146/58  Pulse: 61  Resp: 20  Temp: 97.6 F (36.4 C)  SpO2: (!) 20%     Body mass index is 31.17 kg/m.   Wt Readings from Last 3 Encounters:  10/08/17 187 lb 4.8 oz (85 kg)  08/27/17 185 lb (83.9 kg)  07/30/17 191 lb 2 oz (86.7 kg)      ECOG FS:1 - Symptomatic but completely ambulatory  Sclerae unicteric, pupils round and equal Oropharynx clear and moist No cervical or supraclavicular adenopathy Lungs no rales or rhonchi Heart regular rate and rhythm Abd soft, nontender, positive bowel sounds MSK no focal spinal tenderness, no upper extremity lymphedema Neuro: nonfocal, well oriented, appropriate affect Breasts: Status post bilateral lumpectomies.  She also received bilateral radiation treatments.  The cosmetic result is good.  There is no evidence of residual or recurrent disease.  Both axillae are benign.  LAB RESULTS:  CMP     Component Value Date/Time   NA 142 07/30/2017 1527   K 4.0 07/30/2017 1527   CL 104 07/15/2017 1008   CO2 27 07/30/2017 1527   GLUCOSE 87 07/30/2017 1527   GLUCOSE 88 09/09/2006 1613   BUN 20.6 07/30/2017 1527   CREATININE 0.8 07/30/2017 1527   CALCIUM 9.4 07/30/2017 1527   PROT 7.8 07/30/2017 1527   ALBUMIN 3.9 07/30/2017 1527   AST 21 07/30/2017 1527   ALT 16 07/30/2017 1527   ALKPHOS 76 07/30/2017 1527   BILITOT 0.34 07/30/2017 1527   GFRNONAA >60 07/15/2017 1008   GFRAA >60 07/15/2017 1008    No results found for: TOTALPROTELP, ALBUMINELP, A1GS, A2GS, BETS, BETA2SER, GAMS, MSPIKE, SPEI  No results found for: Nils Pyle, [facility]  Lab Results    Component Value Date   WBC 8.8 07/30/2017   NEUTROABS 6.1 07/30/2017   HGB 12.8 07/30/2017   HCT 38.7 07/30/2017   MCV 94.3 07/30/2017   PLT 203 07/30/2017      Chemistry      Component Value Date/Time   NA 142 07/30/2017 1527   K 4.0 07/30/2017 1527   CL 104 07/15/2017 1008   CO2 27 07/30/2017 1527   BUN 20.6 07/30/2017 1527   CREATININE 0.8 07/30/2017 1527      Component Value Date/Time   CALCIUM 9.4 07/30/2017 1527   ALKPHOS 76 07/30/2017 1527   AST 21 07/30/2017 1527   ALT 16 07/30/2017 1527   BILITOT 0.34 07/30/2017 1527       No results found for: LABCA2  No components found for: YTKPTW656  No results for input(s): INR in the last 168 hours.  No results found for: LABCA2  No results found for: CLE751  No results found for: ZGY174  No results found for: BSW967  No results found for: CA2729  No components found for: HGQUANT  No results found for: CEA1 / No results found for: CEA1   No results found for: AFPTUMOR  No results found for: CHROMOGRNA  No results found for: PSA1  No visits with results within 3 Day(s) from this visit.  Latest known visit with results is:  Appointment on 07/30/2017  Component Date Value Ref Range Status  . WBC 07/30/2017 8.8  3.9 - 10.3 10e3/uL Final  . NEUT# 07/30/2017 6.1  1.5 - 6.5 10e3/uL Final  . HGB 07/30/2017 12.8  11.6 - 15.9 g/dL Final  . HCT 07/30/2017 38.7  34.8 - 46.6 % Final  . Platelets 07/30/2017 203  145 - 400 10e3/uL Final  . MCV 07/30/2017 94.3  79.5 - 101.0 fL Final  . MCH 07/30/2017 31.1  25.1 - 34.0 pg Final  . MCHC 07/30/2017 32.9  31.5 - 36.0 g/dL Final  . RBC 07/30/2017 4.11  3.70 - 5.45 10e6/uL Final  . RDW 07/30/2017 13.8  11.2 -  14.5 % Final  . lymph# 07/30/2017 1.5  0.9 - 3.3 10e3/uL Final  . MONO# 07/30/2017 0.5  0.1 - 0.9 10e3/uL Final  . Eosinophils Absolute 07/30/2017 0.7* 0.0 - 0.5 10e3/uL Final  . Basophils Absolute 07/30/2017 0.1  0.0 - 0.1 10e3/uL Final  . NEUT% 07/30/2017  69.0  38.4 - 76.8 % Final  . LYMPH% 07/30/2017 17.0  14.0 - 49.7 % Final  . MONO% 07/30/2017 5.2  0.0 - 14.0 % Final  . EOS% 07/30/2017 8.1* 0.0 - 7.0 % Final  . BASO% 07/30/2017 0.7  0.0 - 2.0 % Final  . Extra Tube 07/30/2017 Sample held in lab for 7 days   Final  . Sodium 07/30/2017 142  136 - 145 mEq/L Final  . Potassium 07/30/2017 4.0  3.5 - 5.1 mEq/L Final  . Chloride 07/30/2017 108  98 - 109 mEq/L Final  . CO2 07/30/2017 27  22 - 29 mEq/L Final  . Glucose 07/30/2017 87  70 - 140 mg/dl Final   Glucose reference range is for nonfasting patients. Fasting glucose reference range is 70- 100.  Marland Kitchen BUN 07/30/2017 20.6  7.0 - 26.0 mg/dL Final  . Creatinine 07/30/2017 0.8  0.6 - 1.1 mg/dL Final  . Total Bilirubin 07/30/2017 0.34  0.20 - 1.20 mg/dL Final  . Alkaline Phosphatase 07/30/2017 76  40 - 150 U/L Final  . AST 07/30/2017 21  5 - 34 U/L Final  . ALT 07/30/2017 16  0 - 55 U/L Final  . Total Protein 07/30/2017 7.8  6.4 - 8.3 g/dL Final  . Albumin 07/30/2017 3.9  3.5 - 5.0 g/dL Final  . Calcium 07/30/2017 9.4  8.4 - 10.4 mg/dL Final  . Anion Gap 07/30/2017 8  3 - 11 mEq/L Final  . EGFR 07/30/2017 70* >90 ml/min/1.73 m2 Final   eGFR is calculated using the CKD-EPI Creatinine Equation (2009)    (this displays the last labs from the last 3 days)  No results found for: TOTALPROTELP, ALBUMINELP, A1GS, A2GS, BETS, BETA2SER, GAMS, MSPIKE, SPEI (this displays SPEP labs)  No results found for: KPAFRELGTCHN, LAMBDASER, KAPLAMBRATIO (kappa/lambda light chains)  No results found for: HGBA, HGBA2QUANT, HGBFQUANT, HGBSQUAN (Hemoglobinopathy evaluation)   No results found for: LDH  No results found for: IRON, TIBC, IRONPCTSAT (Iron and TIBC)  No results found for: FERRITIN  Urinalysis    Component Value Date/Time   COLORURINE YELLOW 12/19/2016 [place] 12/19/2016 1646   LABSPEC <=1.005 (A) 12/19/2016 1646   PHURINE 6.0 12/19/2016 [place] 12/19/2016  [place] 12/19/2016 [place] 12/19/2016 1646   KETONESUR NEGATIVE 12/19/2016 1646   PROTEINUR 100 (A) 11/17/2016 1342   UROBILINOGEN 0.2 12/19/2016 1646   NITRITE NEGATIVE 12/19/2016 1646   LEUKOCYTESUR NEGATIVE 12/19/2016 1646     STUDIES: No results found.  ELIGIBLE FOR AVAILABLE RESEARCH PROTOCOL: no  ASSESSMENT: 81 y.o. [place], [place] woman status post bilateral lumpectomies and left sentinel lymph node sampling 07/16/2017 showing   (a) a right upper outer quadrant pT1a pNX, stage IA invasive ductal carcinoma, grade 1, estrogen and progesterone receptor positive, HER-2 not amplified, with an MIB-1 of 5%   (b) a left central pT1c pN0, stage IA invasive ductal carcinoma, grade 1, estrogen and progesterone receptor positive, HER-2 not amplified, with an MIB-1 of 5%.  (1) tamoxifen started 07/30/2017  (2) adjuvant radiation completed 09/11/2017  PLAN: [name] is done with local therapy of her breast cancer.  She is continuing on tamoxifen, with excellent tolerance, and the plan will be to do that for 5 years  I think she will shake off this slight fatigue she is experiencing from radiation if she exercises regularly.  We discussed this at length and I gave her some suggestions.  Unfortunately she does live in [facility] and most of the programs we have available are here in [place].  She will see my 82 assistant in 3 months.  She will likely follow up with her surgeon in 6 months.  She will then see me again 9 months from  She knows to call for any other issues that may develop before her next visit  Izaiah Tabb, Virgie Dad, MD  10/08/17 2:40 PM Medical Oncology and Hematology [facility] [place], [place] [address] Tel. [phone]    Fax. [phone]  This document serves as a record of services personally performed by Chauncey Cruel, MD. It was created on his behalf by Margit Banda, a trained medical  scribe. The creation of this record is based on the scribe's personal observations and the provider's statements to them.   I have reviewed the above documentation for accuracy and completeness, and I agree with the above.

## 2017-10-07 ENCOUNTER — Encounter: Payer: Self-pay | Admitting: *Deleted

## 2017-10-08 ENCOUNTER — Ambulatory Visit (HOSPITAL_BASED_OUTPATIENT_CLINIC_OR_DEPARTMENT_OTHER): Payer: Medicare Other | Admitting: Oncology

## 2017-10-08 ENCOUNTER — Telehealth: Payer: Self-pay | Admitting: Oncology

## 2017-10-08 VITALS — BP 146/58 | HR 61 | Temp 97.6°F | Resp 20 | Ht 65.0 in | Wt 187.3 lb

## 2017-10-08 DIAGNOSIS — C50411 Malignant neoplasm of upper-outer quadrant of right female breast: Secondary | ICD-10-CM

## 2017-10-08 DIAGNOSIS — C50112 Malignant neoplasm of central portion of left female breast: Secondary | ICD-10-CM | POA: Diagnosis not present

## 2017-10-08 DIAGNOSIS — Z170 Estrogen receptor positive status [ER+]: Secondary | ICD-10-CM

## 2017-10-08 DIAGNOSIS — Z79810 Long term (current) use of selective estrogen receptor modulators (SERMs): Secondary | ICD-10-CM

## 2017-10-08 NOTE — Telephone Encounter (Signed)
Gave patient AVS and calendar of upcoming February and august appointments.

## 2017-10-15 ENCOUNTER — Ambulatory Visit: Payer: Medicare Other | Admitting: Podiatry

## 2017-10-15 ENCOUNTER — Encounter: Payer: Self-pay | Admitting: Podiatry

## 2017-10-15 ENCOUNTER — Ambulatory Visit (INDEPENDENT_AMBULATORY_CARE_PROVIDER_SITE_OTHER): Payer: Medicare Other

## 2017-10-15 DIAGNOSIS — M779 Enthesopathy, unspecified: Principal | ICD-10-CM

## 2017-10-15 DIAGNOSIS — M7752 Other enthesopathy of left foot: Secondary | ICD-10-CM | POA: Diagnosis not present

## 2017-10-15 DIAGNOSIS — M7672 Peroneal tendinitis, left leg: Secondary | ICD-10-CM

## 2017-10-15 DIAGNOSIS — M778 Other enthesopathies, not elsewhere classified: Secondary | ICD-10-CM

## 2017-10-15 NOTE — Progress Notes (Signed)
She presents today for pain to the dorsal lateral aspect of her left foot 1 year. Progressively getting worse she reports that the pain was sharp and shooting at times with walking and has been swelling a lot recently. She states that she's had no treatment for the pain over the swelling.  Objective: Vital signs are stable alert and oriented 3. Pulses are palpable. Neurologic sensorium is intact. Due to flexor intact. She has pain on palpation of fifth metatarsal base of the left foot and on plantarflexion and eversion severe pain and abduction against resistance and severe pain. There is mild swelling overlying the peroneal tendons near the arcuate portion of the cuboid. Radiographs do not demonstrate interbody devices abnormalities other than what appears to be an older avulsion fracture of the fifth metatarsal base.  Assessment: Avulsion fracture fifth metatarsal base left foot. Peroneal tendinitis left foot.  Plan: Injected the area with prolonged local anesthetic placed her in a short cam boot will follow up with her in 1 month. May need to consider MRI at that point if she is not improved considerably.

## 2017-10-15 NOTE — Progress Notes (Signed)
[place] Radiation Oncology End of Treatment Note  Name:[name]  Date: 09/11/2017 VOH:[id number] DOB:06/13/1936    DIAGNOSIS:  Stage IA bilateral breast cancers:  Right Breast UOQ Invasive Ductal Carcinoma, ER+ / PR+ / Her2neg, Grade 1 Left Breast Central Invasive Ductal Carcinoma, ER+ / PR+ / Her2neg, Grade 1   INDICATION FOR TREATMENT: Curative   TREATMENT DATES: 08-20-17 to 09-11-17                          SITE/DOSE:    Right and Left Breast, 42.56 Gy in 16 fractions                        BEAMS/ENERGY:          3D/ 6X       NARRATIVE:    She tolerated RT well.                        PLAN: Routine followup in one month. Patient instructed to call if questions or worsening complaints in interim.   -----------------------------------  Eppie Gibson, MD

## 2017-10-16 ENCOUNTER — Encounter: Payer: Self-pay | Admitting: Radiation Oncology

## 2017-10-17 ENCOUNTER — Ambulatory Visit: Payer: Self-pay | Admitting: Radiation Oncology

## 2017-10-21 ENCOUNTER — Ambulatory Visit
Admission: RE | Admit: 2017-10-21 | Discharge: 2017-10-21 | Disposition: A | Discharge status: Home / Self Care | Payer: Medicare Other | Source: Ambulatory Visit | Attending: Radiation Oncology | Admitting: Radiation Oncology

## 2017-10-21 ENCOUNTER — Encounter: Payer: Self-pay | Admitting: Radiation Oncology

## 2017-10-21 VITALS — BP 139/66 | HR 59 | Temp 98.0°F | Ht 65.0 in | Wt 185.0 lb

## 2017-10-21 DIAGNOSIS — C50112 Malignant neoplasm of central portion of left female breast: Secondary | ICD-10-CM

## 2017-10-21 DIAGNOSIS — Z170 Estrogen receptor positive status [ER+]: Principal | ICD-10-CM

## 2017-10-21 DIAGNOSIS — Z510 Encounter for antineoplastic radiation therapy: Secondary | ICD-10-CM | POA: Diagnosis not present

## 2017-10-21 DIAGNOSIS — C50411 Malignant neoplasm of upper-outer quadrant of right female breast: Secondary | ICD-10-CM

## 2017-10-21 HISTORY — DX: Personal history of irradiation: Z92.3

## 2017-10-21 NOTE — Progress Notes (Signed)
[name] presents for follow up of radiation completed 09/11/17 to her Right and Left Breast. She denies pain. She has some fatigue. She reports her skin has healed. She is not currently using cream but will begin using a vitamin E lotion. She is taking tamoxifen without difficulty. She has an appointment with survivorship on 01/07/18 and Dr. Jana Hakim on 07/08/18.   BP 139/66   Pulse (!) 59   Temp 98 F (36.7 C)   Ht 5\' 5"  (1.651 m)   Wt 185 lb (83.9 kg)   SpO2 97% Comment: room air  BMI 30.79 kg/m    Wt Readings from Last 3 Encounters:  10/21/17 185 lb (83.9 kg)  10/08/17 187 lb 4.8 oz (85 kg)  08/27/17 185 lb (83.9 kg)

## 2017-10-21 NOTE — Progress Notes (Signed)
Radiation Oncology         [phone] ________________________________  Name: [name] MRN: [id number]  Date: 10/21/2017  DOB: 1936/06/12  Follow-Up Visit Note  Outpatient  CC: Biagio Borg, MD  Erroll Luna, MD  Diagnosis and Prior Radiotherapy:    ICD-10-CM   1. Malignant neoplasm of central portion of left breast in female, estrogen receptor positive ([place]) C50.112    Z17.0   2. Malignant neoplasm of upper-outer quadrant of right breast in female, estrogen receptor positive ([place]) C50.411    Z17.0      Stage IA bilateral breast cancers: Right Breast UOQ Invasive Ductal Carcinoma, ER+ / PR+ / Her2neg, Grade 1  Left Breast Central Invasive Ductal Carcinoma, ER+ / PR+ / Her2neg, Grade 1  TREATMENT DATES: 08-20-17 to 09-11-17                                        SITE/DOSE:    Right and Left Breast, 42.56 Gy in 16 fractions      CHIEF COMPLAINT: Here for follow-up and surveillance of bilateral breast cancer  Narrative:  The patient returns today for routine 1 month follow-up.  Upon review of systems, patient endorses rash under her breasts but reports that this has healed. She denies any use of cream currently. She endorses mild fatigue. Endorses tamoxifen.                            ALLERGIES:  is allergic to codeine; hydromorphone; moxifloxacin; oxycodone; penicillins; and quinine derivatives.  Meds: Current Outpatient Medications  Medication Sig Dispense Refill  . acetaminophen (TYLENOL) 325 MG tablet Take 650 mg by mouth every 6 (six) hours as needed.    . ALPRAZolam (XANAX) 0.25 MG tablet Take 1 tablet (0.25 mg total) by mouth at bedtime as needed for anxiety. 90 tablet 1  . amLODipine (NORVASC) 2.5 MG tablet Take 1 tablet (2.5 mg total) by mouth daily. 90 tablet 3  . aspirin EC 81 MG tablet Take 81 mg by mouth daily.    . clobetasol cream (TEMOVATE) 0.05 % clobetasol 0.05 % topical cream    . CRANBERRY PO Take 2 tablets by mouth 2 (two) times daily.    Marland Kitchen  EPIPEN 2-PAK 0.3 MG/0.3ML SOAJ injection Inject 0.3 mg into the skin as needed (allergic reaction).   12  . estradiol (ESTRACE) 0.1 MG/GM vaginal cream Place 1 Applicatorful vaginally 2 (two) times a week.    . gabapentin (NEURONTIN) 300 MG capsule 1-2 tab by mouth twice per day as needed 360 capsule 1  . hydrocortisone (ANUSOL-HC) 25 MG suppository Place 25 mg rectally 2 (two) times daily as needed.     . latanoprost (XALATAN) 0.005 % ophthalmic solution Place 1 drop into both eyes at bedtime.    . Multiple Vitamin (MULTIVITAMIN) tablet Take 1 tablet by mouth daily.    . naproxen (NAPROSYN) 375 MG tablet Take 1 tablet (375 mg total) by mouth 2 (two) times daily with a meal. 30 tablet 2  . nitrofurantoin (MACRODANTIN) 100 MG capsule Take 100 mg by mouth 4 (four) times daily.    Marland Kitchen nystatin-triamcinolone (MYCOLOG II) cream nystatin-triamcinolone 100,000 unit/g-0.1 % topical cream    . pravastatin (PRAVACHOL) 40 MG tablet Take 1 tablet (40 mg total) by mouth daily. 90 tablet 3  . sertraline (ZOLOFT) 100 MG tablet  2 tab by mouth daily 180 tablet 3  . tamoxifen (NOLVADEX) 20 MG tablet Take 20 mg by mouth daily.    Marland Kitchen terconazole (TERAZOL 7) 0.4 % vaginal cream Place 1 applicator vaginally daily as needed (discomfort).    . timolol (BETIMOL) 0.5 % ophthalmic solution Place 1 drop into both eyes daily.    . traMADol (ULTRAM) 50 MG tablet Take 1 tablet (50 mg total) by mouth every 6 (six) hours as needed. 15 tablet 0  . Vitamins-Lipotropics (LIPOFLAVONOID PO) Take 2 tablets by mouth 2 (two) times daily.    . fluconazole (DIFLUCAN) 100 MG tablet 100 mg tablet by mojth daily for 7 days (Patient not taking: Reported on 10/21/2017) 7 tablet 0  . gentamicin ointment (GARAMYCIN) 0.1 % Apply 1 application topically 3 (three) times daily. INTRANASALLY    . timolol (TIMOPTIC) 0.5 % ophthalmic solution INT 1 GTT IN OU D  3   No current facility-administered medications for this encounter.     Physical  Findings: The patient is in no acute distress. Patient is alert and oriented. Excellent healing. No residual irritation over her breasts.  height is 5\' 5"  (1.651 m) and weight is 185 lb (83.9 kg). Her temperature is 98 F (36.7 C). Her blood pressure is 139/66 and her pulse is 59 (abnormal). Her oxygen saturation is 97%. .   Lab Findings: Lab Results  Component Value Date   WBC 8.8 07/30/2017   HGB 12.8 07/30/2017   HCT 38.7 07/30/2017   MCV 94.3 07/30/2017   PLT 203 07/30/2017    Radiographic Findings: Dg Foot Complete Left  Result Date: 10/15/2017 Please see detailed radiograph report in office note.   Impression/Plan: Healing well from radiotherapy. Apply Vit E Lotion BID to breasts for a couple more months. I encouraged her to continue with yearly mammography and followup with medical oncology. I will see her back on an as-needed basis. I have encouraged her to call if she has any issues or concerns in the future. I wished her the very best.      Eppie Gibson, MD  This document serves as a record of services personally performed by Eppie Gibson, MD. It was created on his behalf by Linward Natal, a trained medical scribe. The creation of this record is based on the scribe's personal observations and the provider's statements to them. This document has been checked and approved by the attending provider.

## 2017-11-19 ENCOUNTER — Ambulatory Visit: Payer: Medicare Other | Admitting: Podiatry

## 2017-11-19 ENCOUNTER — Ambulatory Visit (INDEPENDENT_AMBULATORY_CARE_PROVIDER_SITE_OTHER): Payer: Medicare Other

## 2017-11-19 ENCOUNTER — Encounter: Payer: Self-pay | Admitting: Podiatry

## 2017-11-19 DIAGNOSIS — M7752 Other enthesopathy of left foot: Secondary | ICD-10-CM | POA: Diagnosis not present

## 2017-11-19 DIAGNOSIS — M778 Other enthesopathies, not elsewhere classified: Secondary | ICD-10-CM

## 2017-11-19 DIAGNOSIS — M779 Enthesopathy, unspecified: Principal | ICD-10-CM

## 2017-11-19 DIAGNOSIS — M7672 Peroneal tendinitis, left leg: Secondary | ICD-10-CM | POA: Diagnosis not present

## 2017-11-19 DIAGNOSIS — S92302D Fracture of unspecified metatarsal bone(s), left foot, subsequent encounter for fracture with routine healing: Secondary | ICD-10-CM

## 2017-11-19 NOTE — Progress Notes (Signed)
She presents today for follow-up of tendinitis fifth metatarsal of the left foot.  States that it might be 5% better but I tried wearing the boot still hurts like crazy and is exquisitely painful.  Objective: Vital signs are stable alert and oriented x3 she still has swelling along the lateral aspect of the left foot fifth metatarsal base proximally along the inferior lateral malleolar region and its peroneal tendons are quite tender.  Assessment: Peroneal tendon tear or tendinitis with associated avulsion of the fifth metatarsal base.  Plan: Requesting an MRI of the rear foot left at this point.  Conservative therapies have failed to alleviate this patient's symptoms MRI is going to be necessary for surgical intervention.

## 2017-11-20 ENCOUNTER — Telehealth: Payer: Self-pay

## 2017-11-20 NOTE — Telephone Encounter (Signed)
Per Wendelyn Breslow T. With [facility], MRI does not require precert.  Patient notified via voice mail to contact [place] for appt at her convenience

## 2017-11-20 NOTE — Addendum Note (Signed)
Addended by: Graceann Congress D on: 11/20/2017 11:26 AM   Modules accepted: Orders

## 2017-11-20 NOTE — Addendum Note (Signed)
Addended by: Graceann Congress D on: 11/20/2017 10:47 AM   Modules accepted: Orders

## 2017-11-28 ENCOUNTER — Ambulatory Visit
Admission: RE | Admit: 2017-11-28 | Discharge: 2017-11-28 | Disposition: A | Discharge status: Home / Self Care | Payer: Medicare Other | Source: Ambulatory Visit | Attending: Podiatry | Admitting: Podiatry

## 2017-11-28 DIAGNOSIS — M7672 Peroneal tendinitis, left leg: Secondary | ICD-10-CM

## 2017-12-08 ENCOUNTER — Encounter: Payer: Self-pay | Admitting: Podiatry

## 2017-12-08 ENCOUNTER — Ambulatory Visit: Payer: Medicare Other | Admitting: Podiatry

## 2017-12-08 DIAGNOSIS — M19072 Primary osteoarthritis, left ankle and foot: Secondary | ICD-10-CM

## 2017-12-08 MED ORDER — MELOXICAM 15 MG PO TABS: 15.0000 mg | ORAL_TABLET | Freq: Every day | ORAL | 3 refills | Status: AC

## 2017-12-08 NOTE — Progress Notes (Signed)
She presents today with her daughter for follow-up of her pain to her left foot.  She states that is doing much better than it was and that she is here to discuss her MRI.  Objective: Vital signs are stable alert and oriented x3.  Pulses are palpable.  She still has pain on palpation to the fourth fifth metatarsal cuboid articulation.  MRI does demonstrate primary finding as osteoarthritic change of the fourth and fifth metatarsal cuboid articulation.  No other acute findings are noted.  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  Plan: Discussed appropriate shoe gear stretching exercises ice therapy as your modifications oral anti-inflammatory therapy.  Wrote a prescription for meloxicam 15 mg 1 p.o. daily.  Follow-up with me as needed.  May need to consider orthotics.

## 2017-12-18 ENCOUNTER — Encounter: Payer: Self-pay | Admitting: Oncology

## 2017-12-22 ENCOUNTER — Other Ambulatory Visit: Payer: Self-pay | Admitting: Oncology

## 2017-12-22 NOTE — Progress Notes (Signed)
I spoke with Jackelyn Poling Malena's daughter.  She tells me Yuriana is in the hospital with ARDS and bacterial and viral pneumonias in [facility].  As part of the workup they discovered a small "inconsequential" pulmonary embolus and it is being treated.  Nevertheless I think it would be prudent to stop the tamoxifen.  I have changed her appointment in February to be with me instead of my 41 assistant and at that time we likely will switch to an aromatase inhibitor  I also reassured them that if necessary they can use the left arm for blood draws.

## 2017-12-27 MED ORDER — MORPHINE SULFATE 10 MG/5ML PO SOLN
2.50 mg | ORAL | Status: DC
Start: ? — End: 2017-12-27

## 2017-12-27 MED ORDER — ALPRAZOLAM 0.5 MG PO TABS
0.25 mg | ORAL_TABLET | ORAL | Status: DC
Start: ? — End: 2017-12-27

## 2017-12-27 MED ORDER — LORAZEPAM 0.5 MG PO TABS
0.50 mg | ORAL_TABLET | ORAL | Status: DC
Start: ? — End: 2017-12-27

## 2017-12-27 MED ORDER — SCOPOLAMINE 1 MG/3DAYS TD PT72
1.00 | MEDICATED_PATCH | TRANSDERMAL | Status: DC
Start: 2017-12-29 — End: 2017-12-27

## 2017-12-27 MED ORDER — GUAIFENESIN 100 MG/5ML PO SYRP
200.00 mg | ORAL_SOLUTION | ORAL | Status: DC
Start: ? — End: 2017-12-27

## 2017-12-27 MED ORDER — ACETAMINOPHEN 325 MG PO TABS
650.00 mg | ORAL_TABLET | ORAL | Status: DC
Start: ? — End: 2017-12-27

## 2017-12-27 MED ORDER — POLYETHYLENE GLYCOL 3350 17 G PO PACK
17.00 | PACK | ORAL | Status: DC
Start: ? — End: 2017-12-27

## 2017-12-27 MED ORDER — DOCUSATE SODIUM 100 MG PO CAPS
100.00 mg | ORAL_CAPSULE | ORAL | Status: DC
Start: ? — End: 2017-12-27

## 2017-12-27 MED ORDER — GENERIC EXTERNAL MEDICATION
10.00 | Status: DC
Start: ? — End: 2017-12-27

## 2017-12-27 MED ORDER — BENZONATATE 100 MG PO CAPS
100.00 mg | ORAL_CAPSULE | ORAL | Status: DC
Start: ? — End: 2017-12-27

## 2017-12-29 ENCOUNTER — Telehealth: Payer: Self-pay | Admitting: Internal Medicine

## 2017-12-31 ENCOUNTER — Other Ambulatory Visit: Payer: Self-pay | Admitting: Oncology

## 2018-01-07 ENCOUNTER — Other Ambulatory Visit: Payer: Medicare Other

## 2018-01-07 ENCOUNTER — Ambulatory Visit: Payer: Medicare Other | Admitting: Adult Health

## 2018-01-14 ENCOUNTER — Telehealth: Payer: Self-pay

## 2018-01-14 NOTE — Telephone Encounter (Signed)
Tammy: can you update patient's status. Thanks!

## 2018-01-14 NOTE — Telephone Encounter (Signed)
FYI  Tammy:  Copied from [place]. Topic: General - Deceased Patient >> 01/31/2018 11:16 AM Darl Householder, RMA wrote: Reason for CRM: patient's daughter Jackelyn Poling called to inform Dr. Jenny Reichmann that patient passed away two weeks ago

## 2018-01-16 NOTE — Telephone Encounter (Signed)
Copied from [place]. Topic: General - Deceased Patient >> 01/28/2018  1:28 PM Lolita Rieger, [place] wrote: [facility] called to inform pt deceased today January 28, 2018 @1 :6 a.m.

## 2018-01-16 NOTE — Telephone Encounter (Signed)
Faxed obit to medical records

## 2018-01-16 DEATH — deceased

## 2018-01-28 ENCOUNTER — Ambulatory Visit: Payer: Medicare Other | Admitting: Podiatry

## 2018-02-25 ENCOUNTER — Ambulatory Visit: Payer: Medicare Other | Admitting: Internal Medicine

## 2018-07-08 ENCOUNTER — Ambulatory Visit: Payer: Medicare Other | Admitting: Oncology

## 2018-07-08 ENCOUNTER — Other Ambulatory Visit: Payer: Medicare Other

## 2018-08-03 IMAGING — CR DG KNEE 1-2V PORT*R*
2 series · 2 of 2 positions shown · non-contrast
Comparison: None.

CLINICAL DATA: Status post right total knee arthroplasty.

EXAM:
PORTABLE RIGHT KNEE - 1-2 VIEW

[AP]
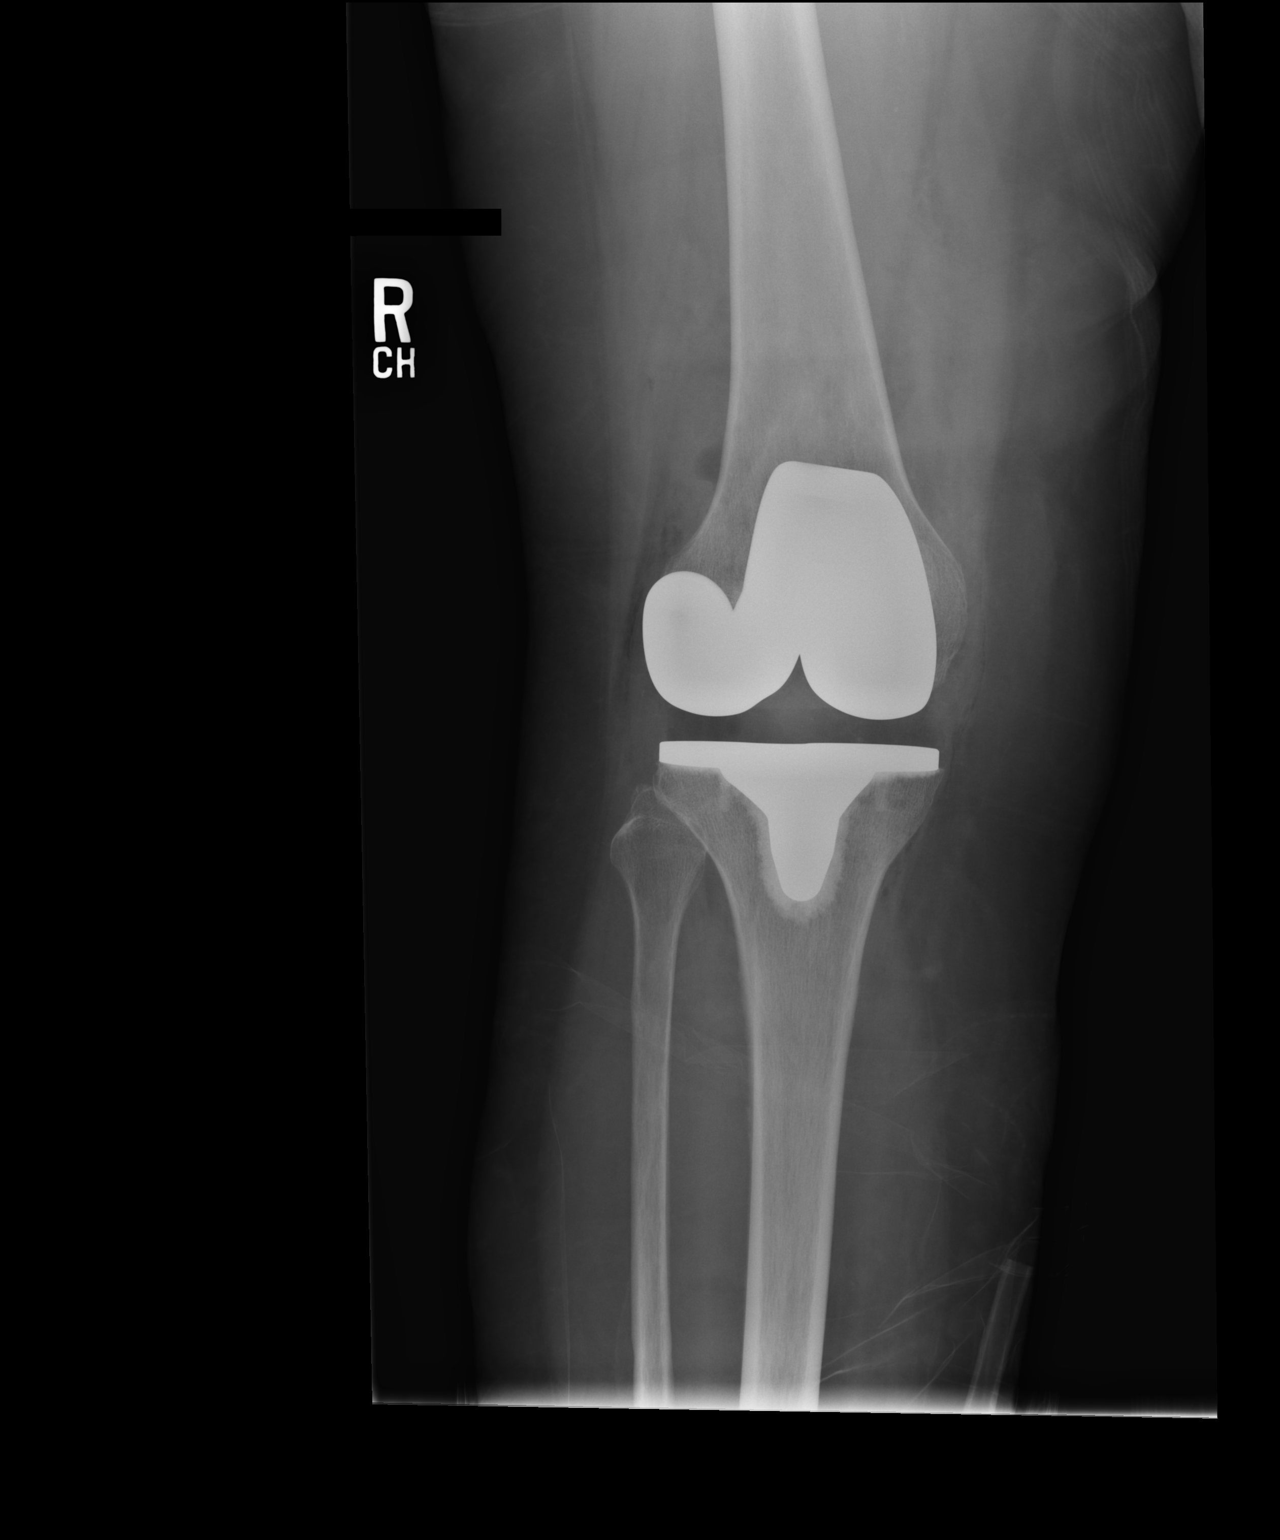

[xtable lateral]
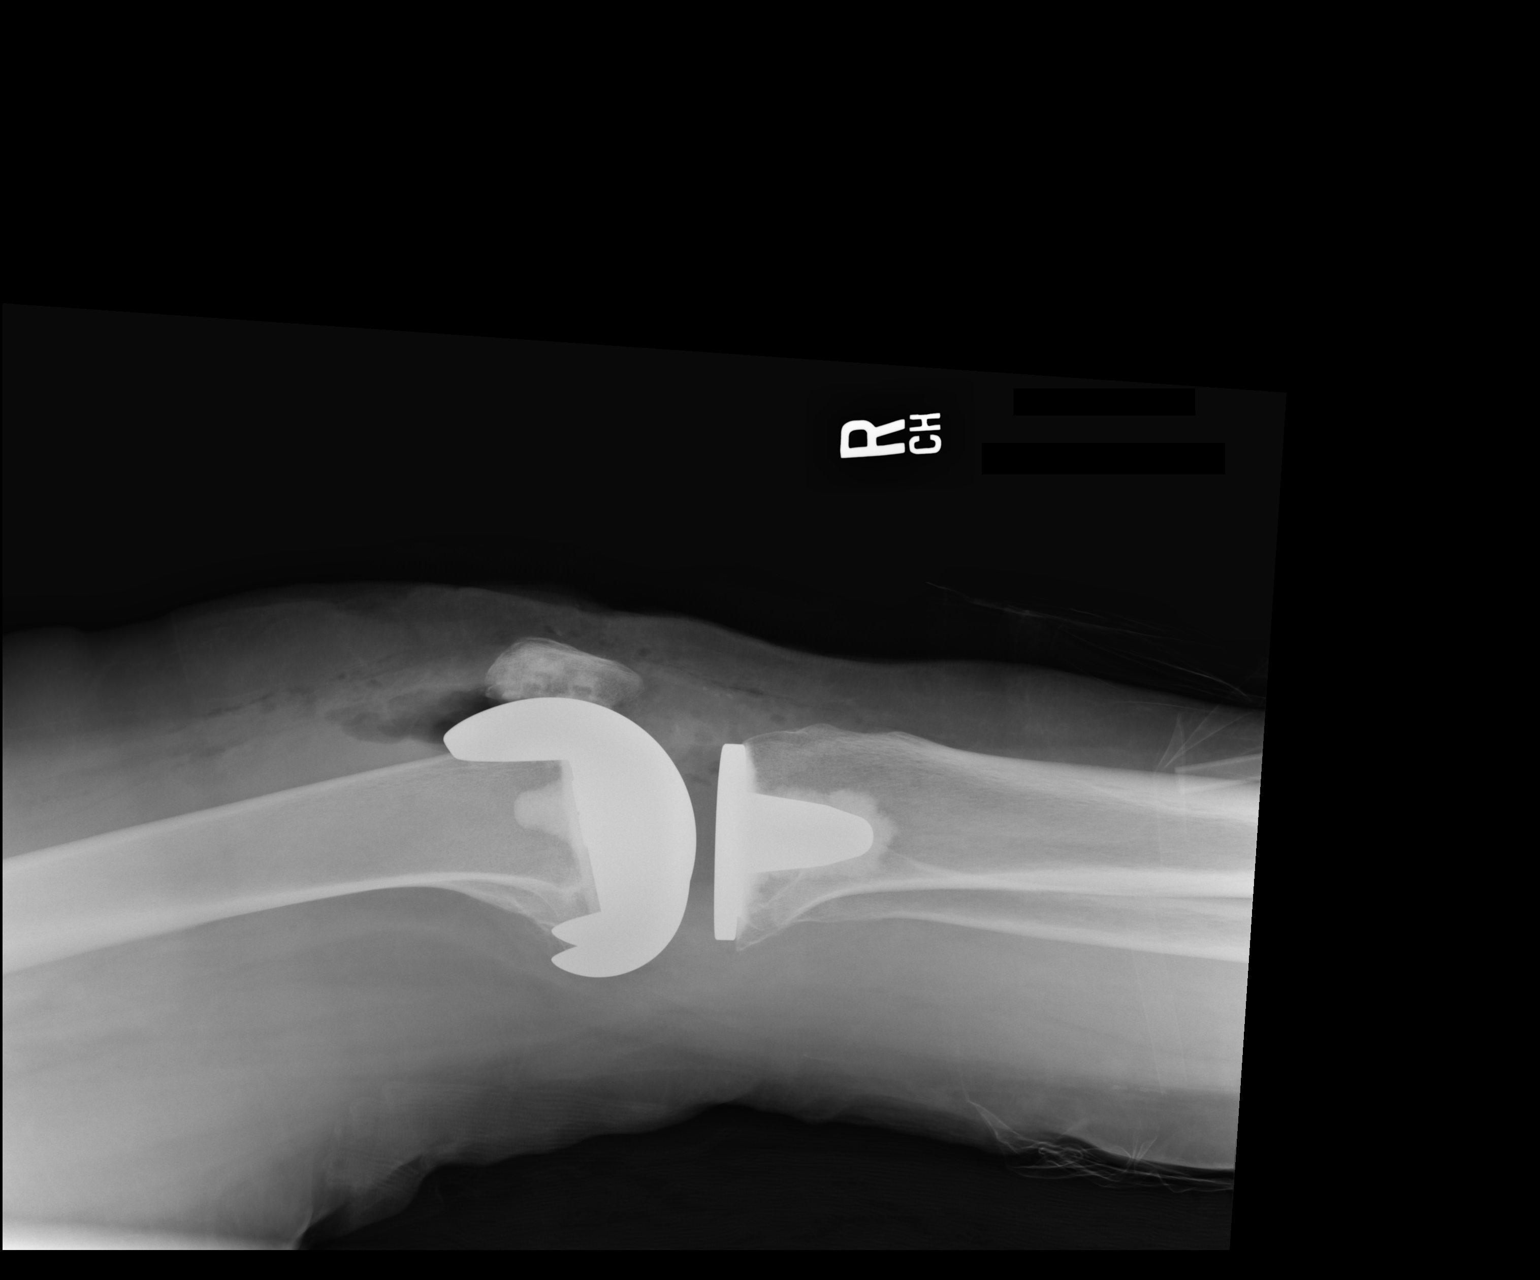

[2 of 2 positions shown; findings below may reference images not displayed]

FINDINGS: The femoral and tibial components are well situated. No fracture or
dislocation is noted. Expected postoperative changes are seen in the
soft tissues anteriorly.
IMPRESSION: Status post right total knee arthroplasty.

## 2019-04-02 IMAGING — MG MM PLC BREAST LOC DEV 1ST LESION INC*R*
3 series · 3 of 3 positions shown · non-contrast
Comparison: Previous exam(s).

CLINICAL DATA: 81-year-old female presenting for radioactive seed
localization of the bilateral breasts.

EXAM:
MAMMOGRAPHIC GUIDED RADIOACTIVE SEED LOCALIZATION OF THE BILATERAL
BREAST

[R LM]
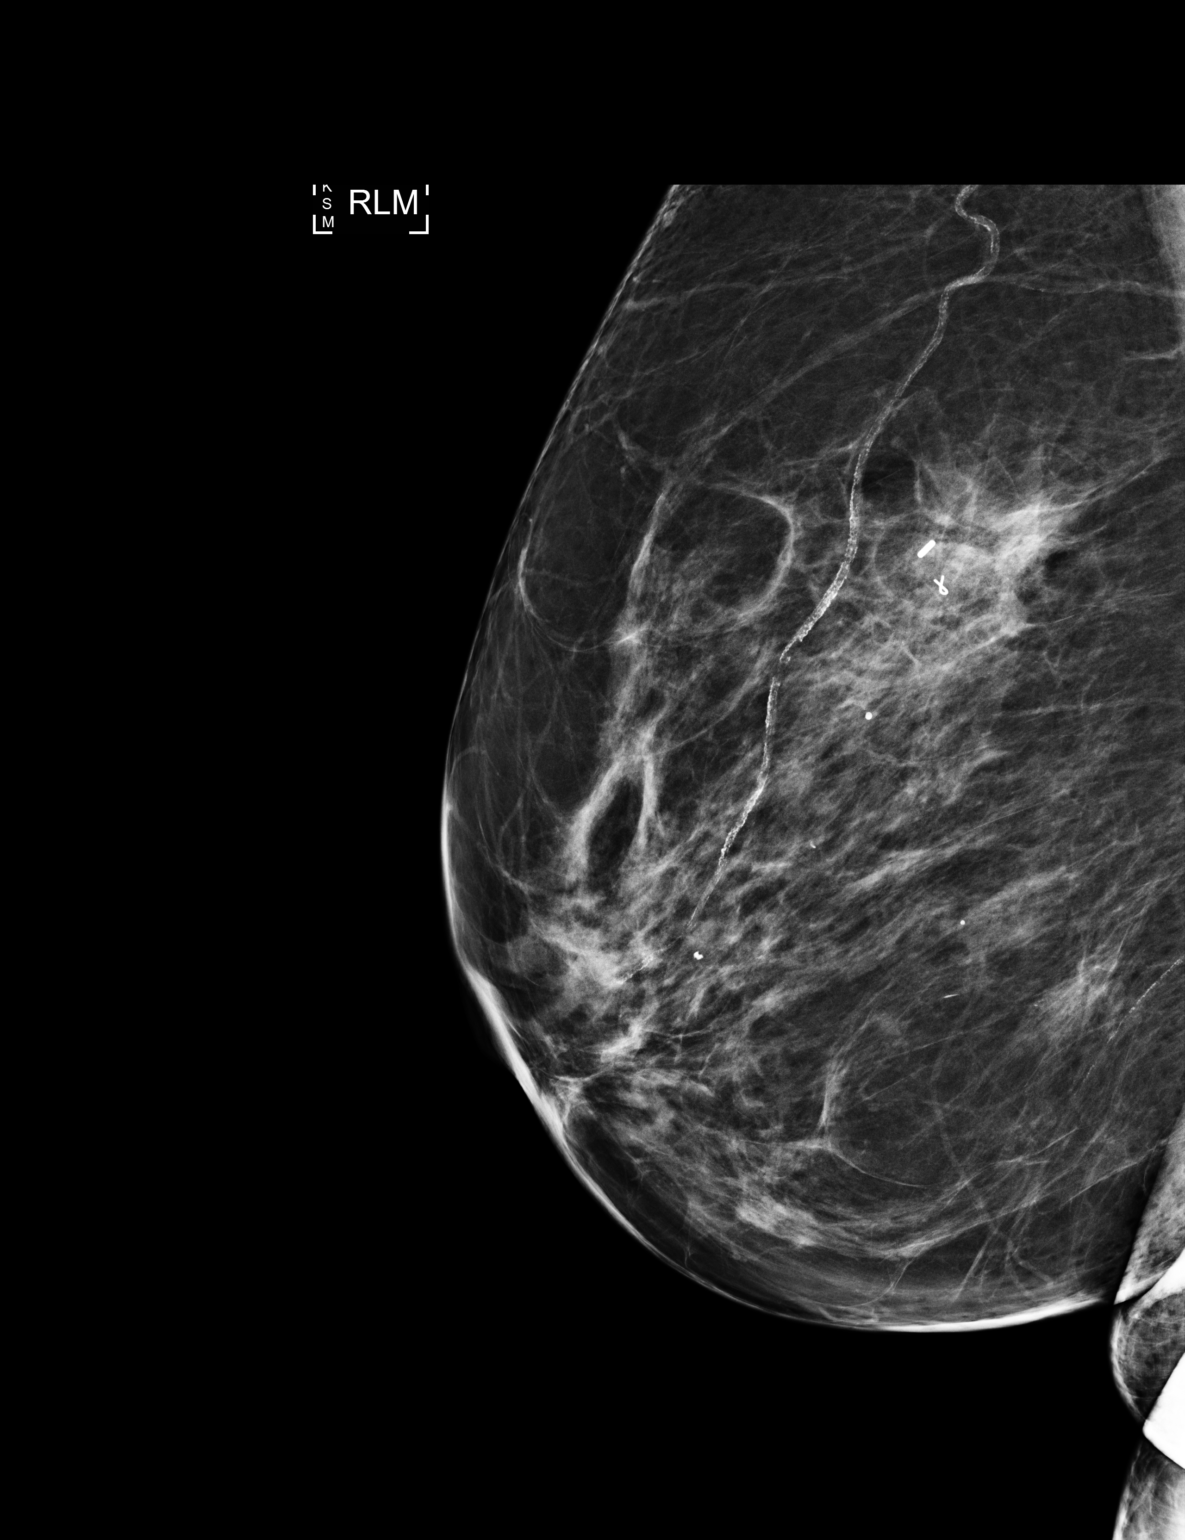

[R CC (1 of 2)]
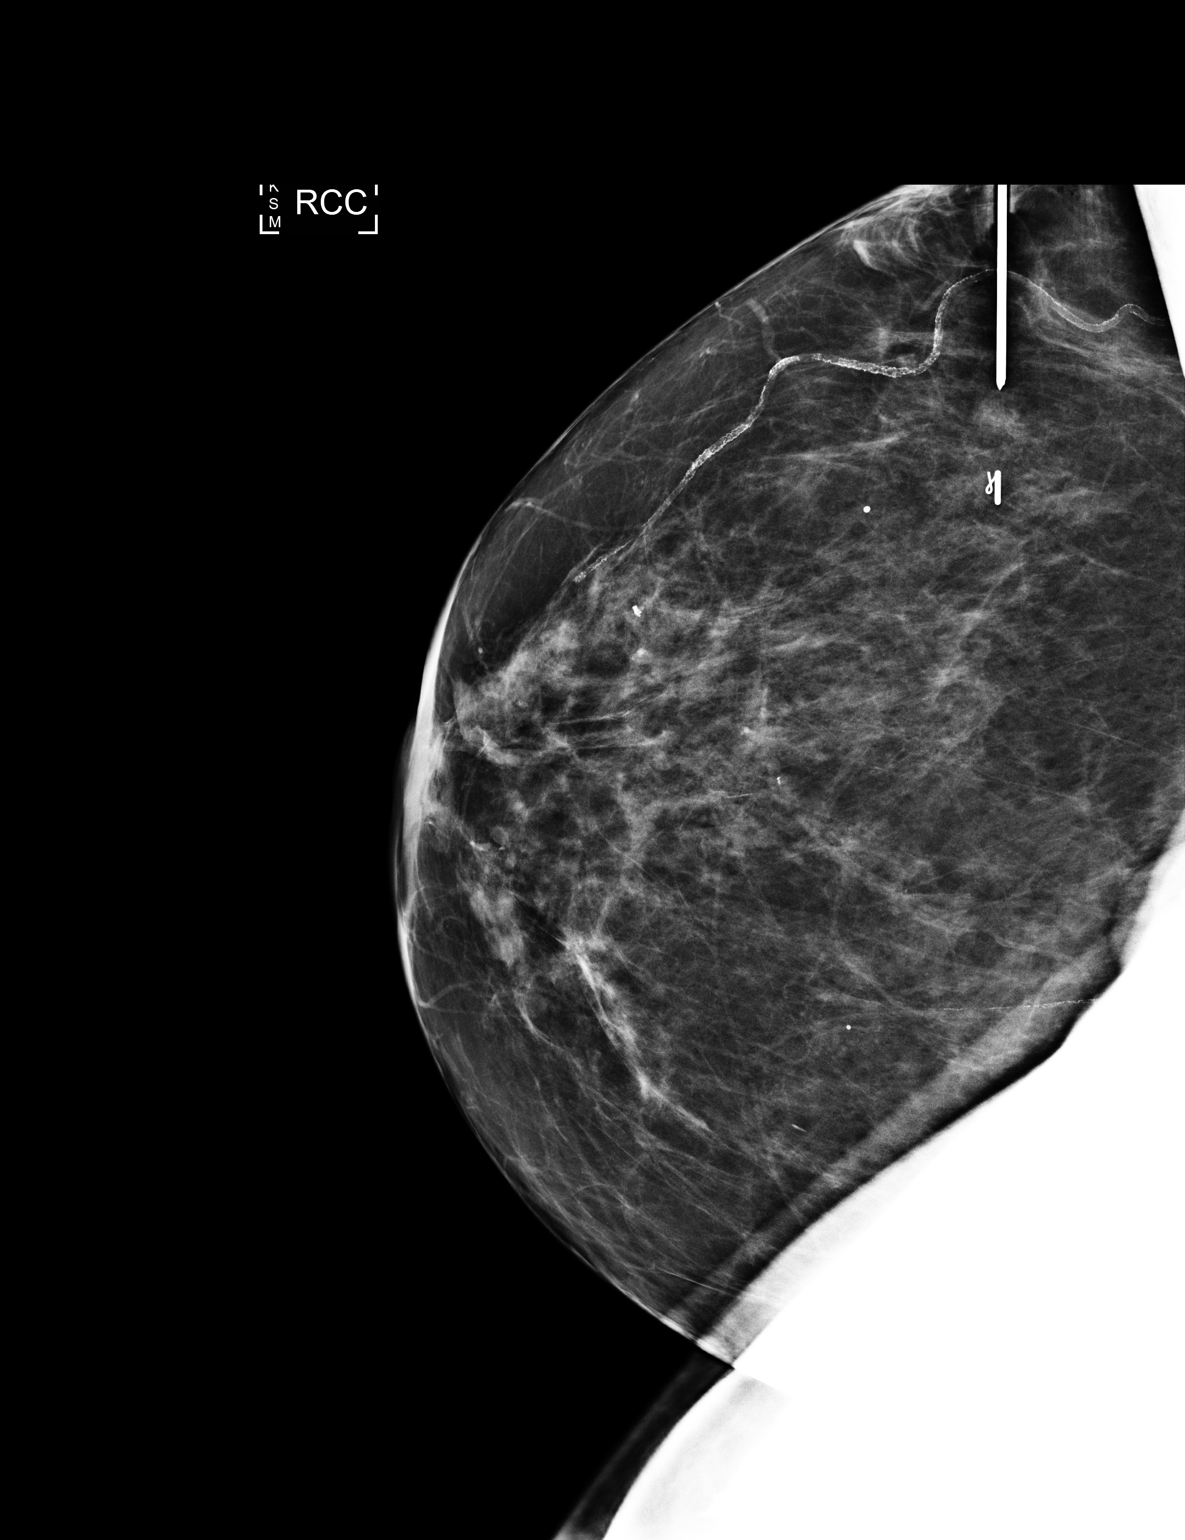

[R CC (2 of 2)]
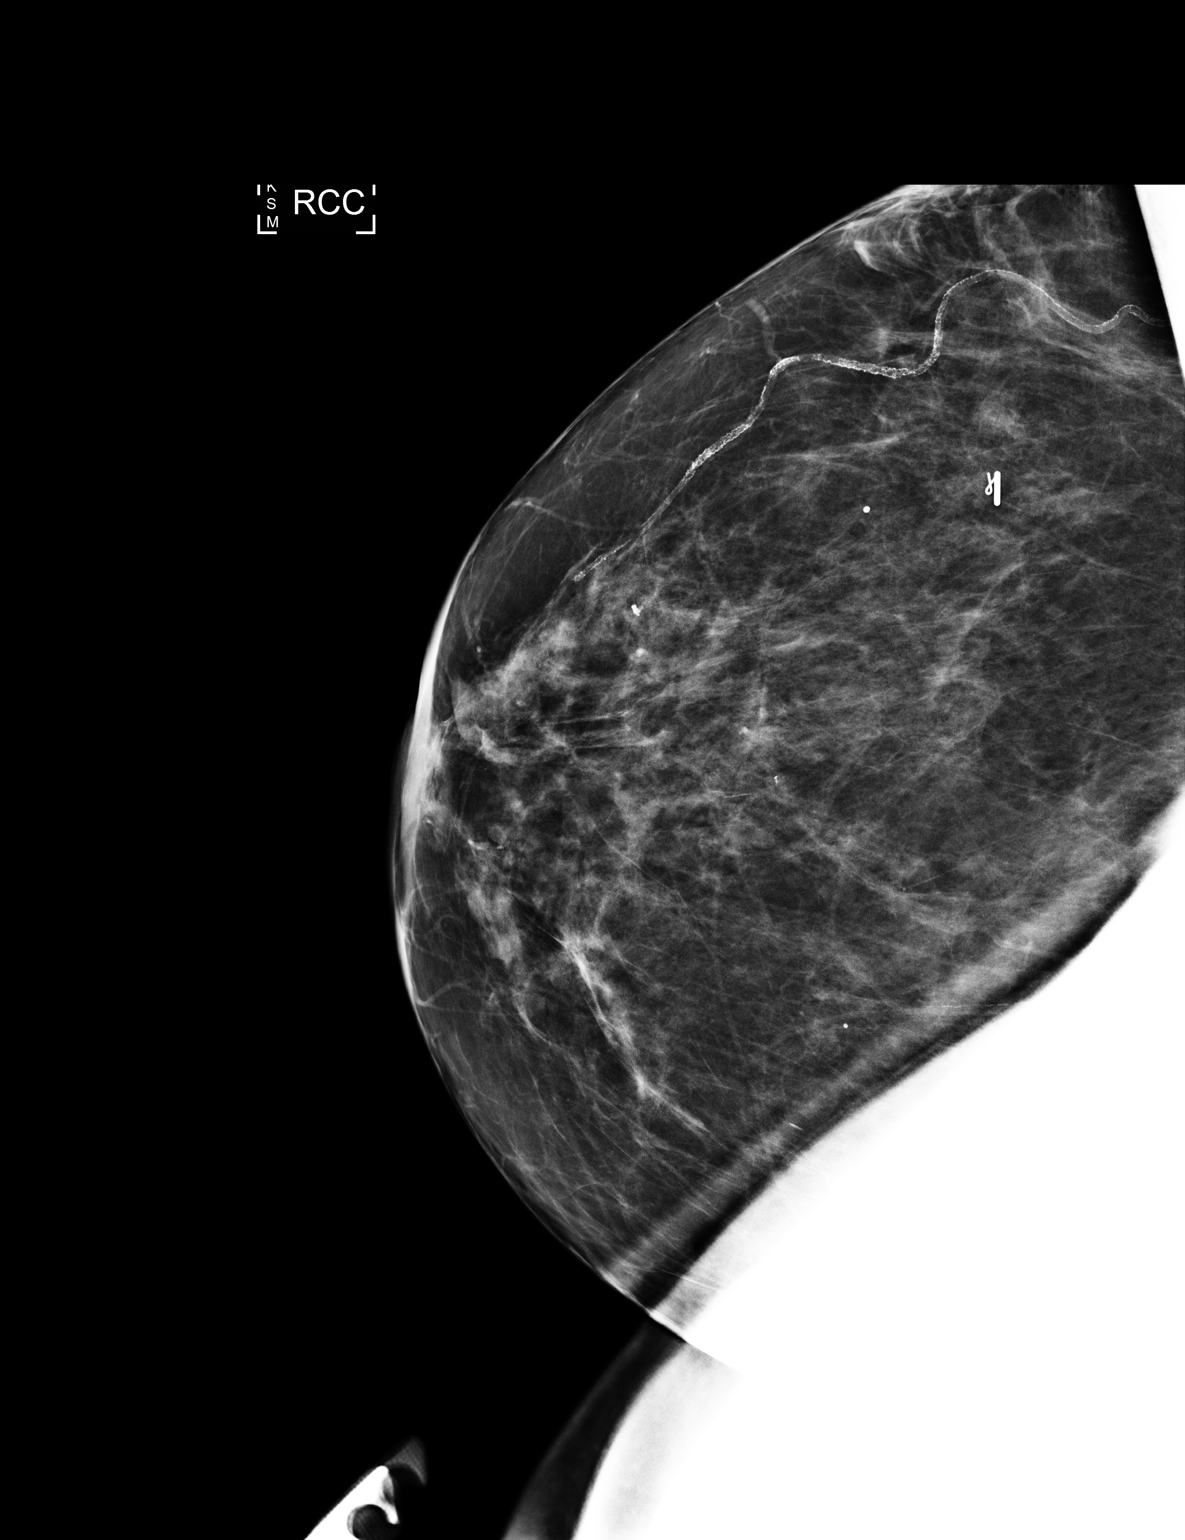

[3 of 3 positions shown; findings below may reference images not displayed]

FINDINGS: Patient presents for radioactive seed localization prior to left
breast lumpectomy and right breast excisional biopsy. I met with the
patient and we discussed the procedure of seed localization
including benefits and alternatives. We discussed the high
likelihood of a successful procedure. We discussed the risks of the
procedure including infection, bleeding, tissue injury and further
surgery. We discussed the low dose of radioactivity involved in the
procedure. Informed, written consent was given.

The usual time-out protocol was performed immediately prior to the
procedure.

Using mammographic guidance, sterile technique, 1% lidocaine and an
S-EWL radioactive seed, the area of distortion in the right breast
which is just superior to the ribbon shaped biopsy marking clip was
localized using a lateral approach. The follow-up mammogram images
confirm the seed in the expected location.

Follow-up survey of the patient confirms presence of the radioactive
seed.

Order number of S-EWL seed:  [id number].

Total activity:  0.248 millicuries  Reference Date: 06/26/2017

Using mammographic guidance, sterile technique, 1% lidocaine and an
S-EWL radioactive seed, the mass with the coil shaped biopsy marking
clip was localized using a lateral approach. The follow-up mammogram
images confirm the seed in the expected location.

Follow-up survey of the patient confirms presence of the radioactive
seed.

Order number of S-EWL seed:  [id number].

Total activity:  0.252 millicuries  Reference Date: 07/03/2017

The patient tolerated the procedure well and was released from the
[REDACTED]. She was given instructions regarding seed removal.
IMPRESSION: Radioactive seed localization bilateral breasts. No apparent
complications.
